# Patient Record
Sex: Female | Born: 1970 | Race: White | Hispanic: No | Marital: Married | Smoking: Current every day smoker
Health system: Southern US, Community
[De-identification: ages and names within clinical notes are randomized; demographics above are authoritative.]

## PROBLEM LIST (undated history)

## (undated) DIAGNOSIS — M797 Fibromyalgia: Secondary | ICD-10-CM

## (undated) DIAGNOSIS — R001 Bradycardia, unspecified: Secondary | ICD-10-CM

## (undated) DIAGNOSIS — D649 Anemia, unspecified: Secondary | ICD-10-CM

## (undated) DIAGNOSIS — I10 Essential (primary) hypertension: Secondary | ICD-10-CM

## (undated) DIAGNOSIS — Z87442 Personal history of urinary calculi: Secondary | ICD-10-CM

## (undated) DIAGNOSIS — M1990 Unspecified osteoarthritis, unspecified site: Secondary | ICD-10-CM

## (undated) DIAGNOSIS — Z8719 Personal history of other diseases of the digestive system: Secondary | ICD-10-CM

## (undated) DIAGNOSIS — C801 Malignant (primary) neoplasm, unspecified: Secondary | ICD-10-CM

## (undated) DIAGNOSIS — G629 Polyneuropathy, unspecified: Secondary | ICD-10-CM

## (undated) DIAGNOSIS — F419 Anxiety disorder, unspecified: Secondary | ICD-10-CM

## (undated) HISTORY — PX: OTHER SURGICAL HISTORY: SHX169

## (undated) HISTORY — PX: ABDOMINAL HYSTERECTOMY: SHX81

## (undated) HISTORY — PX: APPENDECTOMY: SHX54

## (undated) HISTORY — PX: CHOLECYSTECTOMY: SHX55

## (undated) HISTORY — PX: BREAST CYST ASPIRATION: SHX578

## (undated) HISTORY — PX: JOINT REPLACEMENT: SHX530

## (undated) HISTORY — PX: REPLACEMENT TOTAL KNEE: SUR1224

---
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
## 2004-02-13 ENCOUNTER — Other Ambulatory Visit: Payer: Self-pay

## 2004-12-15 ENCOUNTER — Inpatient Hospital Stay: Payer: Self-pay | Admitting: Unknown Physician Specialty

## 2005-04-19 ENCOUNTER — Emergency Department: Payer: Self-pay | Admitting: Unknown Physician Specialty

## 2007-06-18 ENCOUNTER — Other Ambulatory Visit: Payer: Self-pay

## 2007-06-18 ENCOUNTER — Emergency Department: Payer: Self-pay | Admitting: Emergency Medicine

## 2009-03-29 ENCOUNTER — Emergency Department: Payer: Self-pay | Admitting: Emergency Medicine

## 2009-11-05 ENCOUNTER — Emergency Department: Payer: Self-pay | Admitting: Emergency Medicine

## 2009-12-05 ENCOUNTER — Emergency Department: Payer: Self-pay | Admitting: Emergency Medicine

## 2011-05-04 ENCOUNTER — Emergency Department: Payer: Self-pay | Admitting: Emergency Medicine

## 2011-10-19 ENCOUNTER — Emergency Department: Payer: Self-pay | Admitting: *Deleted

## 2012-05-04 ENCOUNTER — Emergency Department: Payer: Self-pay | Admitting: Emergency Medicine

## 2013-03-05 ENCOUNTER — Observation Stay: Payer: Self-pay | Admitting: Internal Medicine

## 2013-03-05 LAB — CBC
HCT: 42.5 % (ref 35.0–47.0)
HGB: 14.4 g/dL (ref 12.0–16.0)
MCHC: 33.8 g/dL (ref 32.0–36.0)
Platelet: 239 10*3/uL (ref 150–440)
RBC: 4.16 10*6/uL (ref 3.80–5.20)
RDW: 13.9 % (ref 11.5–14.5)
WBC: 15.5 10*3/uL — ABNORMAL HIGH (ref 3.6–11.0)

## 2013-03-05 LAB — URINALYSIS, COMPLETE
Bilirubin,UR: NEGATIVE
Glucose,UR: NEGATIVE mg/dL (ref 0–75)
Hyaline Cast: 3
Ketone: NEGATIVE
Ph: 6 (ref 4.5–8.0)
Specific Gravity: 1.01 (ref 1.003–1.030)
Squamous Epithelial: <1
WBC UR: 12 /HPF (ref 0–5)

## 2013-03-05 LAB — BASIC METABOLIC PANEL
BUN: 9 mg/dL (ref 7–18)
Calcium, Total: 8.8 mg/dL (ref 8.5–10.1)
Chloride: 102 mmol/L (ref 98–107)
Co2: 28 mmol/L (ref 21–32)
EGFR (African American): >60
EGFR (Non-African Amer.): >60
Glucose: 87 mg/dL (ref 65–99)
Potassium: 3.5 mmol/L (ref 3.5–5.1)
Sodium: 136 mmol/L (ref 136–145)

## 2013-03-05 LAB — TSH: Thyroid Stimulating Horm: 0.626 u[IU]/mL

## 2013-03-05 LAB — TROPONIN I: Troponin-I: <0.02 ng/mL

## 2013-03-06 DIAGNOSIS — R55 Syncope and collapse: Secondary | ICD-10-CM

## 2013-03-06 DIAGNOSIS — R079 Chest pain, unspecified: Secondary | ICD-10-CM

## 2013-03-06 LAB — CBC WITH DIFFERENTIAL/PLATELET
Basophil %: 0.5 %
Eosinophil %: 2.6 %
HCT: 38.8 % (ref 35.0–47.0)
HGB: 13.5 g/dL (ref 12.0–16.0)
Lymphocyte %: 26.3 %
MCH: 35.5 pg — ABNORMAL HIGH (ref 26.0–34.0)
MCHC: 34.7 g/dL (ref 32.0–36.0)
MCV: 102 fL — ABNORMAL HIGH (ref 80–100)
Monocyte #: 1 x10 3/mm — ABNORMAL HIGH (ref 0.2–0.9)
Neutrophil #: 6.8 10*3/uL — ABNORMAL HIGH (ref 1.4–6.5)
Neutrophil %: 61.9 %
Platelet: 223 10*3/uL (ref 150–440)
RDW: 13.6 % (ref 11.5–14.5)
WBC: 10.9 10*3/uL (ref 3.6–11.0)

## 2013-03-06 LAB — BASIC METABOLIC PANEL
Anion Gap: 5 — ABNORMAL LOW (ref 7–16)
BUN: 14 mg/dL (ref 7–18)
Calcium, Total: 8.6 mg/dL (ref 8.5–10.1)
Chloride: 105 mmol/L (ref 98–107)
Creatinine: 0.77 mg/dL (ref 0.60–1.30)
EGFR (African American): >60
EGFR (Non-African Amer.): >60
Glucose: 85 mg/dL (ref 65–99)
Osmolality: 274 (ref 275–301)
Potassium: 3.4 mmol/L — ABNORMAL LOW (ref 3.5–5.1)

## 2013-03-06 LAB — TROPONIN I: Troponin-I: <0.02 ng/mL

## 2014-06-18 ENCOUNTER — Emergency Department: Payer: Self-pay | Admitting: Emergency Medicine

## 2014-06-18 LAB — URINALYSIS, COMPLETE
Bilirubin,UR: NEGATIVE
Glucose,UR: NEGATIVE mg/dL (ref 0–75)
Ketone: NEGATIVE
LEUKOCYTE ESTERASE: NEGATIVE
NITRITE: NEGATIVE
Ph: 7 (ref 4.5–8.0)
Protein: NEGATIVE
RBC,UR: 3 /HPF (ref 0–5)
SPECIFIC GRAVITY: 1.008 (ref 1.003–1.030)
Squamous Epithelial: NONE SEEN

## 2014-06-18 LAB — COMPREHENSIVE METABOLIC PANEL
ALK PHOS: 53 U/L
Albumin: 4 g/dL (ref 3.4–5.0)
Anion Gap: 6 — ABNORMAL LOW (ref 7–16)
BUN: 6 mg/dL — ABNORMAL LOW (ref 7–18)
Bilirubin,Total: 0.6 mg/dL (ref 0.2–1.0)
CALCIUM: 8.8 mg/dL (ref 8.5–10.1)
CHLORIDE: 105 mmol/L (ref 98–107)
Co2: 26 mmol/L (ref 21–32)
Creatinine: 0.84 mg/dL (ref 0.60–1.30)
GLUCOSE: 82 mg/dL (ref 65–99)
Osmolality: 271 (ref 275–301)
POTASSIUM: 3.9 mmol/L (ref 3.5–5.1)
SGOT(AST): 16 U/L (ref 15–37)
SGPT (ALT): 13 U/L — ABNORMAL LOW
SODIUM: 137 mmol/L (ref 136–145)
Total Protein: 7.9 g/dL (ref 6.4–8.2)

## 2014-06-18 LAB — CBC
HCT: 38.9 % (ref 35.0–47.0)
HGB: 12.9 g/dL (ref 12.0–16.0)
MCH: 33.3 pg (ref 26.0–34.0)
MCHC: 33.3 g/dL (ref 32.0–36.0)
MCV: 100 fL (ref 80–100)
PLATELETS: 243 10*3/uL (ref 150–440)
RBC: 3.89 10*6/uL (ref 3.80–5.20)
RDW: 13.7 % (ref 11.5–14.5)
WBC: 11.8 10*3/uL — ABNORMAL HIGH (ref 3.6–11.0)

## 2014-06-18 LAB — LIPASE, BLOOD: LIPASE: 118 U/L (ref 73–393)

## 2015-03-12 NOTE — Discharge Summary (Signed)
PATIENT NAME:  Amy Logan, Amy Logan MR#:  [id number] DATE OF BIRTH:  1971-05-08  DATE OF ADMISSION:  03/05/2013 DATE OF DISCHARGE:  03/06/2013  ADMITTING DIAGNOSIS: Chest pain and syncope.   DISCHARGE DIAGNOSES: 1.  Chest pain felt to be noncardiac, status post stress test.  2.  Syncope, felt to be vasovagal in nature. Echocardiogram showed no significant abnormality.  3.  Hypertension.  4.  Chronic neuropathy.  5.  History of myocardial infarction in 2008.  6.  Anxiety disorder.   CONSULTANTS: None.   PERTINENT LABS AND EVALUATIONS: Admitting BMP: Glucose was 87, BUN 9, creatinine 0.77, sodium 135, potassium 3.5, chloride 102, CO2 28 and calcium 8.8. Troponin was less than 0.02 times 3. TSH 0.626. WBC on admission was 15.5, hemoglobin 14.4 and platelet count was 203. UA was nitrite negative, leukocytes negative.   EKG on admission showed normal sinus rhythm without any ST-T wave changes.   Echocardiogram of the heart showed LVF of 60% to 65%, normal left global ventricular systolic function, borderline concentric LVH.   Myocardial perfusion scan was negative for ischemia or infarct.   HOSPITAL COURSE: Please refer to H and P done by the admitting physician. The patient is a 44 year old Caucasian female with history of hypertension, neuropathy, and history of MI in 2008.  She presented with complaint of having chest pain and syncope after earlier in the day she had an argument with her boyfriend and started having severe chest pain. She came to the ED.  Her cardiac enzymes and EKG were nonrevealing. The patient also had an episode of syncope. Due to these symptoms, the patient was placed on telemetry. Serial cardiac enzymes were done, which were negative. The patient underwent a cardiac stress test, which was negative. She also had an echocardiogram which did not show any significant abnormality. The patient was asymptomatic and her symptoms were felt to be atypical. She was deemed stable for  discharge at this time.   DISCHARGE MEDICATIONS:  1.  Hydrochlorothiazide/lisinopril 25/20 mg daily.  2.  Cymbalta 60 mg daily. 3.  Tylenol 650 mg q. 4 hours p.r.n.  4.  Xanax 1 mg t.i.d. p.r.n. anxiety.  DIET: Low sodium.   ACTIVITY: As tolerated.   TIMEFRAME FOR FOLLOW-UP:  In 1 to 2 weeks with primary MD.  TIME SPENT: 35 minutes. ____________________________ Lafonda Mosses Posey Pronto, MD shp:sb D: 03/07/2013 08:06:06 ET T: 03/07/2013 08:56:02 ET JOB#: [id number]  cc: Cleon Thoma H. Posey Pronto, MD, <Dictator> Alric Seton MD ELECTRONICALLY SIGNED 03/16/2013 13:03

## 2015-03-12 NOTE — H&P (Signed)
PATIENT NAME:  Amy Logan, Amy Logan MR#:  [id number] DATE OF BIRTH:  [birth year]  DATE OF ADMISSION:  03/05/2013   PRIMARE CARE PHYSICIAN:  Dr. Bernie Covey  REQUESTING PHYSICIAN:  Dr. Royal Hawthorn  CHIEF COMPLAINT:  Chest pain.   HISTORY OF PRESENT ILLNESS:  The patient is a 44 year old female with a known history of hypertension, neuropathy and MI in 2008 is being admitted for chest pain and syncope.  The patient was having some argument with her boyfriend in the afternoon, around 12:30 to 1:00 p.m., when she started having severe chest pain.  She was bent over.  She has never had this bad chest pain and her boyfriend ignored thinking that she was just faking, but her pain continued.  She sat down on her knee and was able to stand up and walk to her kitchen where she fell.  Her boyfriend called out and she would not respond.  He tried 1 or 2 times shouting loudly, but she would not respond so he went to the kitchen to see what happened and she was found completely unconscious with eye twitching really fast.  She was out for about 15 to 20 minutes without any response.  EMS was called who also found her to be unconscious.  They tried ammonia, also tried deep pain stimulation, along with ammonia and oxygen, but she would not respond.  She did have some dry heaving afterwards and woke up after 20 minutes.  She was still having some chest tightness and is being admitted for further evaluation and management.   PAST MEDICAL HISTORY:   1.  Hypertension.  2.  Neuropathy.  3.  History of myocardial infarction in 2008 and was following with cardiology at [place] where she was living.   ALLERGIES:  DEMEROL, IMITREX, MORPHINE, PHENERGAN, COCONUT AND ONION.  MEDICATIONS AT HOME:   1.  Lisinopril/hydrochlorothiazide 20/25 mg 1 tablet by mouth daily. 2.  Cymbalta 60 mg by mouth at bedtime.  3.  Xanax 0.5 mg by mouth at bedtime as needed.    SOCIAL HISTORY:  Smokes 1 pack of cigarettes daily for the last  25 years.  Occasional alcohol.  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   FAMILY HISTORY:  Father with colon and liver cancer, died of heart disease.  Mother is alive with diabetes, heart failure, coronary disease and COPD.   REVIEW OF SYSTEMS: CONSTITUTIONAL:  No fever, fatigue, weakness.  EYES:  No blurred or double vision.  EARS, NOSE, THROAT:  No tinnitus or ear pain.  RESPIRATORY:  No cough, wheezing, hemoptysis.  CARDIOVASCULAR:  Positive for chest pain.  No orthopnea, edema.  GASTROINTESTINAL:  No nausea, vomiting, diarrhea.  GENITOURINARY:  No dysuria, hematuria.  ENDOCRINE:  No polyuria, nocturia.  HEMATOLOGY:  No anemia or easy bruising.  SKIN:  No rash or lesion.  MUSCULOSKELETAL:  No arthritis or muscle cramps.  NEUROLOGIC:  Unconsciousness and neuropathy.  PSYCHIATRIC:  Possible anxiety and depression.   PHYSICAL EXAMINATION: VITAL SIGNS:  Temperature 97.8, heart rate 70 per minute, respirations 20 per minute, blood pressure 132/84 mmhg.  She is saturating 99% on room air. GENERAL:  The patient is a 44 year old female lying in the bed comfortably without any acute distress.  EYES:  Pupils equal, round, reactive to light and accommodation.  No scleral icterus.  Extraocular muscles intact.  HEENT:  Head atraumatic, normocephalic.  Oropharynx and nasopharynx clear. NECK:  Supple, No JVD. No thyroid enlargement or tenderness.  LUNGS:  Clear to auscultation  bilaterally.  No wheezing, rales, rhonchi, crepitation.   CARDIOVASCULAR:  S1, S2 normal.  No murmurs, rubs or gallops.  ABDOMEN:  Soft, nontender, nondistended.  Bowel sounds present.  No organomegaly or mass.  EXTREMITIES:  No pedal edema, cyanosis, clubbing. NEUROLOGIC:  Cranial nerves II through XII intact.  Muscle strength 5 out of 5 in all extremities.  Sensation intact.    PSYCHIATRIC:  The patient seems anxious.  She does seem quite upset also.  She was just on the phone with her boyfriend.  They have been  having a lot of arguments back and forth.  SKIN:  No obvious rash, lesion or ulcer.   LABORATORY DATA:  Normal BMP.  Normal first set of cardiac enzymes. Normal CBC except white count of 15.5.   Chest x-ray in the ED showed no acute cardiopulmonary disease.   EKG showed no acute ST-T changes.  Normal sinus rhythm.   IMPRESSION AND PLAN: 1.  Chest pain, very atypical, likely due to recent argument with her boyfriend, probably psychogenic, although considering her history of myocardial infarction, we will admit her on observation.  Get serial troponins.  Myoview in the morning.  She will be placed on off unit telemetry considering her unconscious spell to make sure she does not have any major arrhythmias including ventricular tachycardia or ventricular fibrillation.  Again, this is likely due to psychological issues.  2.  Syncope/collapse, slightly vasovagal in nature.  We will check orthostatic vitals and TSH.  Start her on Xanax as needed and monitor her status.  We will hold off any further neurological workup as this does sound more vasovagal in nature.  She is nonfocal at this time.  3.  Neuropathy.  We will continue Cymbalta.  4.  Hypertension.  We will continue home medication.  Her blood pressure is stable.  5.  CODE STATUS:  FULL CODE.  6.  Tobacco abuse.  She was counseled for about three minutes.  She does not have any intention to quit at this time.  She feels very stressed due to her relation with her boyfriend and we will provide smoking cessation material at this time.  She denied any nicotine patch in the hospital.  Total time taking care of this patient is 55 minutes.     ____________________________ Lucina Mellow. Manuella Ghazi, MD vss:ea D: 03/05/2013 17:50:17 ET T: 03/05/2013 19:05:30 ET JOB#: [id number]  cc: Sharran Caratachea S. Manuella Ghazi, MD, <Dictator> Lucina Mellow [facility] MD ELECTRONICALLY SIGNED 03/07/2013 23:25

## 2015-08-15 ENCOUNTER — Encounter: Payer: Self-pay | Admitting: *Deleted

## 2015-08-15 ENCOUNTER — Ambulatory Visit
Admission: EM | Admit: 2015-08-15 | Discharge: 2015-08-15 | Disposition: A | Discharge status: Home / Self Care | Payer: Medicaid Other | Attending: Internal Medicine | Admitting: Internal Medicine

## 2015-08-15 DIAGNOSIS — J0100 Acute maxillary sinusitis, unspecified: Secondary | ICD-10-CM | POA: Diagnosis not present

## 2015-08-15 DIAGNOSIS — J0110 Acute frontal sinusitis, unspecified: Secondary | ICD-10-CM | POA: Diagnosis not present

## 2015-08-15 HISTORY — DX: Anxiety disorder, unspecified: F41.9

## 2015-08-15 HISTORY — DX: Bradycardia, unspecified: R00.1

## 2015-08-15 HISTORY — DX: Polyneuropathy, unspecified: G62.9

## 2015-08-15 HISTORY — DX: Essential (primary) hypertension: I10

## 2015-08-15 MED ORDER — BENZONATATE 100 MG PO CAPS: 100.0000 mg | ORAL_CAPSULE | Freq: Three times a day (TID) | ORAL | Status: DC | PRN

## 2015-08-15 MED ORDER — AZITHROMYCIN 250 MG PO TABS: ORAL_TABLET | ORAL | Status: DC

## 2015-08-15 MED ORDER — FLUCONAZOLE 150 MG PO TABS: 150.0000 mg | ORAL_TABLET | Freq: Once | ORAL | Status: DC

## 2015-08-15 NOTE — ED Provider Notes (Signed)
[facility] Emergency Department Provider Note  ____________________________________________  Time seen: Approximately 12:26 PM  I have reviewed the triage vital signs and the nursing notes.   HISTORY  Chief Complaint Facial Pain; Nasal Congestion; and Cough   HPI Amy Logan is a 44 y.o. female presents for the complaints of runny nose, congestion and sinus pressure since last Friday. Reports started out as a runny nose and then has continued to progress into sinus pressure. Patient reports that she has a chronic history of sinus infections and sinus issues in this feels similar. Reports cough is primarily with nasal drainage and more at night. Denies chest pain, short is a breath, fever, abdominal pain, neck pain or other complaints. Reports continues to eat and drink well. Denies recent sick contacts. Reports history of seasonal allergies and feels that this may have started as a trigger for this.    Past Medical History  Diagnosis Date  . Hypertension   . Neuropathy   . Anxiety   . Bradycardia     There are no active problems to display for this patient.   Past Surgical History  Procedure Laterality Date  . Cholecystectomy    . Appendectomy    . Replacement total knee      Right knee  . Abdominal hysterectomy      Current Outpatient Rx  Name  Route  Sig  Dispense  Refill  . ALPRAZolam (XANAX) 1 MG tablet   Oral   Take 1 mg by mouth 3 (three) times daily as needed for anxiety.         . DULoxetine HCl (CYMBALTA PO)   Oral   Take 90 mg by mouth.         Marland Kitchen lisinopril-hydrochlorothiazide (PRINZIDE,ZESTORETIC) 20-25 MG per tablet   Oral   Take 1 tablet by mouth daily.           Allergies Demerol; Morphine and related; and Phenergan  No family history on file.  Social History Social History  Substance Use Topics  . Smoking status: Current Every Day Smoker  . Smokeless tobacco: None  . Alcohol Use: No    Review of  Systems Constitutional: No fever/chills Eyes: No visual changes. QVZ:DGLOVFIE for runny nose, congestion and sinus pressure.  Cardiovascular: Denies chest pain. Respiratory: Denies shortness of breath. Gastrointestinal: No abdominal pain.  No nausea, no vomiting.  No diarrhea.  No constipation. Genitourinary: Negative for dysuria. Musculoskeletal: Negative for back pain. Skin: Negative for rash. Neurological: Negative for headaches, focal weakness or numbness.  10-point ROS otherwise negative.  ____________________________________________   PHYSICAL EXAM:  VITAL SIGNS: ED Triage Vitals  Enc Vitals Group     BP 08/15/15 1143 122/74 mmHg     Pulse Rate 08/15/15 1143 73     Resp -- 18     Temp 08/15/15 1143 98.1 F (36.7 C)     Temp Source 08/15/15 1143 Oral     SpO2 08/15/15 1143 100 %     Weight 08/15/15 1143 178 lb (80.74 kg)     Height 08/15/15 1143 5' 6.5" (1.689 m)     Head Cir --      Peak Flow --      Pain Score 08/15/15 1147 2     Pain Loc --      Pain Edu? --      Excl. in [place]? --     Constitutional: Alert and oriented. Well appearing and in no acute distress. Eyes: Conjunctivae are normal.  PERRL. EOMI. Head: Atraumatic.mod TTP maxillary sinus, and mod TTP frontal sinus. No erythema or swelling.   Ears: no erythema, bilateral TM dullness, no erythema.   Nose: clear rhinorrhea with bilateral nasal turbinate edema and bogginess, nares patent.   Mouth/Throat: Mucous membranes are moist.  Oropharynx non-erythematous. Neck: No stridor.  No cervical spine tenderness to palpation. Hematological/Lymphatic/Immunilogical: No cervical lymphadenopathy. Cardiovascular: Normal rate, regular rhythm. Grossly normal heart sounds.  Good peripheral circulation. Respiratory: Normal respiratory effort.  No retractions. Lungs CTAB. Gastrointestinal: Soft and nontender. No distention. Normal Bowel sounds.   Musculoskeletal: No lower or upper extremity tenderness nor edema.   Neurologic:  Normal speech and language. No gross focal neurologic deficits are appreciated. No gait instability. Skin:  Skin is warm, dry and intact. No rash noted. Psychiatric: Mood and affect are normal. Speech and behavior are normal.  ____________________________________________   LABS (all labs ordered are listed, but only abnormal results are displayed)  Labs Reviewed - No data to display   INITIAL IMPRESSION / ASSESSMENT AND PLAN / ED COURSE  Pertinent labs & imaging results that were available during my care of the patient were reviewed by me and considered in my medical decision making (see chart for details).  Well-appearing patient. No acute distress. Presents for runny nose, nasal congestion and sinus pressure since last Friday. Patient reports chronic history of sinusitis as well as sinus surgery. Patient reports intermittent cough primarily with postnasal drainage. Will treat sinusitis with oral azithromycin, prn tessalon perles, patient also requests rx for x one diflucan as she often experiences vaginal yeast infections with antibiotic use. Discussed follow up with Primary care physician this week, Dr bliss. . Discussed follow up and return parameters including no resolution or any worsening concerns. Patient verbalized understanding and agreed to plan.   ____________________________________________   FINAL CLINICAL IMPRESSION(S) / ED DIAGNOSES  Final diagnoses:  Acute maxillary sinusitis, recurrence not specified  Acute frontal sinusitis, recurrence not specified       Marylene Land, NP 08/15/15 1235

## 2015-08-15 NOTE — Discharge Instructions (Signed)
Take medication as prescribed. Rest. Drink plenty of fluids.   Follow up with your primary care physician this week. Return to urgent care for new or worsening concerns.   Sinusitis Sinusitis is redness, soreness, and inflammation of the paranasal sinuses. Paranasal sinuses are air pockets within the bones of your face (beneath the eyes, the middle of the forehead, or above the eyes). In healthy paranasal sinuses, mucus is able to drain out, and air is able to circulate through them by way of your nose. However, when your paranasal sinuses are inflamed, mucus and air can become trapped. This can allow bacteria and other germs to grow and cause infection. Sinusitis can develop quickly and last only a short time (acute) or continue over a long period (chronic). Sinusitis that lasts for more than 12 weeks is considered chronic.  CAUSES  Causes of sinusitis include:  Allergies.  Structural abnormalities, such as displacement of the cartilage that separates your nostrils (deviated septum), which can decrease the air flow through your nose and sinuses and affect sinus drainage.  Functional abnormalities, such as when the small hairs (cilia) that line your sinuses and help remove mucus do not work properly or are not present. SIGNS AND SYMPTOMS  Symptoms of acute and chronic sinusitis are the same. The primary symptoms are pain and pressure around the affected sinuses. Other symptoms include:  Upper toothache.  Earache.  Headache.  Bad breath.  Decreased sense of smell and taste.  A cough, which worsens when you are lying flat.  Fatigue.  Fever.  Thick drainage from your nose, which often is green and may contain pus (purulent).  Swelling and warmth over the affected sinuses. DIAGNOSIS  Your health care provider will perform a physical exam. During the exam, your health care provider may:  Look in your nose for signs of abnormal growths in your nostrils (nasal polyps).  Tap over  the affected sinus to check for signs of infection.  View the inside of your sinuses (endoscopy) using an imaging device that has a light attached (endoscope). If your health care provider suspects that you have chronic sinusitis, one or more of the following tests may be recommended:  Allergy tests.  Nasal culture. A sample of mucus is taken from your nose, sent to a lab, and screened for bacteria.  Nasal cytology. A sample of mucus is taken from your nose and examined by your health care provider to determine if your sinusitis is related to an allergy. TREATMENT  Most cases of acute sinusitis are related to a viral infection and will resolve on their own within 10 days. Sometimes medicines are prescribed to help relieve symptoms (pain medicine, decongestants, nasal steroid sprays, or saline sprays).  However, for sinusitis related to a bacterial infection, your health care provider will prescribe antibiotic medicines. These are medicines that will help kill the bacteria causing the infection.  Rarely, sinusitis is caused by a fungal infection. In theses cases, your health care provider will prescribe antifungal medicine. For some cases of chronic sinusitis, surgery is needed. Generally, these are cases in which sinusitis recurs more than 3 times per year, despite other treatments. HOME CARE INSTRUCTIONS   Drink plenty of water. Water helps thin the mucus so your sinuses can drain more easily.  Use a humidifier.  Inhale steam 3 to 4 times a day (for example, sit in the bathroom with the shower running).  Apply a warm, moist washcloth to your face 3 to 4 times a day, or  as directed by your health care provider.  Use saline nasal sprays to help moisten and clean your sinuses.  Take medicines only as directed by your health care provider.  If you were prescribed either an antibiotic or antifungal medicine, finish it all even if you start to feel better. SEEK IMMEDIATE MEDICAL CARE  IF:  You have increasing pain or severe headaches.  You have nausea, vomiting, or drowsiness.  You have swelling around your face.  You have vision problems.  You have a stiff neck.  You have difficulty breathing. MAKE SURE YOU:   Understand these instructions.  Will watch your condition.  Will get help right away if you are not doing well or get worse. Document Released: 11/06/2005 Document Revised: 03/23/2014 Document Reviewed: 11/21/2011 [facility] Patient Information 2015 [place], [place]. This information is not intended to replace advice given to you by your health care provider. Make sure you discuss any questions you have with your health care provider.

## 2015-08-15 NOTE — ED Notes (Signed)
Pt states that she has sinus pressure and cough around Friday.

## 2015-11-04 ENCOUNTER — Ambulatory Visit
Admission: EM | Admit: 2015-11-04 | Discharge: 2015-11-04 | Disposition: A | Discharge status: Home / Self Care | Payer: Medicaid Other | Attending: Family Medicine | Admitting: Family Medicine

## 2015-11-04 DIAGNOSIS — J321 Chronic frontal sinusitis: Secondary | ICD-10-CM | POA: Diagnosis not present

## 2015-11-04 MED ORDER — SULFAMETHOXAZOLE-TRIMETHOPRIM 800-160 MG PO TABS: 1.0000 | ORAL_TABLET | Freq: Two times a day (BID) | ORAL | Status: DC

## 2015-11-04 MED ORDER — FLUTICASONE PROPIONATE 50 MCG/ACT NA SUSP: 2.0000 | Freq: Every day | NASAL | Status: DC

## 2015-11-04 MED ORDER — FLUCONAZOLE 150 MG PO TABS: 150.0000 mg | ORAL_TABLET | Freq: Every day | ORAL | Status: DC

## 2015-11-04 NOTE — ED Notes (Signed)
Started 3 days ago with sinus congestion and pressure behind eyes. Green/yellow nasal drainage

## 2015-11-04 NOTE — Discharge Instructions (Signed)

## 2015-11-04 NOTE — ED Provider Notes (Signed)
CSN: [id number]     Arrival date & time 11/04/15  0903 History   First MD Initiated Contact with Patient 11/04/15 1028     Chief Complaint  Patient presents with  . URI   (Consider location/radiation/quality/duration/timing/severity/associated sxs/prior Treatment) HPI   44 year old female who returns today sinus congestion or behind her eyes and mucous of a green-yellow colored. States that she was treated months ago for her chronic sinusitis but was given Zithromax which does not seem to work as well as the [employer]. 4 she's had her symptoms but these have exacerbated over the last 3 days. 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 Is that she took her temperature today which was 102 although she is afebrile today. O2 sats on room air are 100%.   Past Medical History  Diagnosis Date  . Hypertension   . Neuropathy ([place])   . Anxiety   . Bradycardia    Past Surgical History  Procedure Laterality Date  . Cholecystectomy    . Appendectomy    . Replacement total knee      Right knee  . Abdominal hysterectomy    . Joint replacement     Family History  Problem Relation Age of Onset  . Diabetes Mother   . Heart failure Mother   . Asthma Mother   . Cancer Father    Social History  Substance Use Topics  . Smoking status: Current Every Day Smoker -- 0.50 packs/day    Types: Cigarettes  . Smokeless tobacco: None  . Alcohol Use: No   OB History    No data available     Review of Systems  Constitutional: Positive for fever. Negative for chills, diaphoresis, activity change and fatigue.  HENT: Positive for congestion, ear pain, postnasal drip, rhinorrhea, sinus pressure, sneezing and sore throat.   All other systems reviewed and are negative.   Allergies  Demerol; Morphine and related; and Phenergan  Home Medications   Prior to Admission medications   Medication Sig Start Date End Date Taking? Authorizing  Provider  ALPRAZolam Duanne Moron) 1 MG tablet Take 1 mg by mouth 3 (three) times daily as needed for anxiety.   Yes Historical Provider, MD  DULoxetine HCl (CYMBALTA PO) Take 90 mg by mouth.   Yes Historical Provider, MD  lisinopril-hydrochlorothiazide (PRINZIDE,ZESTORETIC) 20-25 MG per tablet Take 1 tablet by mouth daily.   Yes Historical Provider, MD  azithromycin (ZITHROMAX Z-PAK) 250 MG tablet Take 2 tablets (500 mg) on  Day 1,  followed by 1 tablet (250 mg) once daily on Days 2 through 5. 08/15/15   Marylene Land, NP  benzonatate (TESSALON PERLES) 100 MG capsule Take 1 capsule (100 mg total) by mouth 3 (three) times daily as needed for cough. 08/15/15   Marylene Land, NP  fluconazole (DIFLUCAN) 150 MG tablet Take 1 tablet (150 mg total) by mouth daily. 11/04/15   Lorin Picket, PA-C  fluticasone (FLONASE) 50 MCG/ACT nasal spray Place 2 sprays into both nostrils daily. 11/04/15   Lorin Picket, PA-C  sulfamethoxazole-trimethoprim (BACTRIM DS,SEPTRA DS) 800-160 MG tablet Take 1 tablet by mouth 2 (two) times daily. 11/04/15   Lorin Picket, PA-C   Meds Ordered and Administered this Visit  Medications - No data to display  BP 145/88 mmHg  Pulse 78  Temp(Src) 96.7 F (35.9 C) (Tympanic)  Resp 18  Ht 5\' 6"  (1.676 m)  Wt 176 lb (79.833 kg)  BMI 28.42 kg/m2  SpO2 100% No data found.   Physical Exam  Constitutional: She is oriented to person, place, and time. She appears well-developed and well-nourished. No distress.  HENT:  Head: Normocephalic and atraumatic.  Right Ear: External ear normal.  Left Ear: External ear normal.  Nose: Nose normal.  Mouth/Throat: Oropharynx is clear and moist. No oropharyngeal exudate.  Patient has tenderness to percussion over the frontal and especially the maxillary sinuses bilaterally  Eyes: Conjunctivae are normal. Pupils are equal, round, and reactive to light.  Neck: Normal range of motion. Neck supple.  Pulmonary/Chest: Breath sounds normal. No  respiratory distress. She has no wheezes. She has no rales.  Musculoskeletal: Normal range of motion. She exhibits no edema or tenderness.  Lymphadenopathy:    She has no cervical adenopathy.  Neurological: She is alert and oriented to person, place, and time.  Skin: Skin is warm and dry. She is not diaphoretic.  Psychiatric: She has a normal mood and affect. Her behavior is normal. Judgment and thought content normal.  Nursing note and vitals reviewed.   ED Course  Procedures (including critical care time)  Labs Review Labs Reviewed - No data to display  Imaging Review No results found.   Visual Acuity Review  Right Eye Distance:   Left Eye Distance:   Bilateral Distance:    Right Eye Near:   Left Eye Near:    Bilateral Near:         MDM   1. Sinusitis chronic, frontal    New Prescriptions   FLUCONAZOLE (DIFLUCAN) 150 MG TABLET    Take 1 tablet (150 mg total) by mouth daily.   FLUTICASONE (FLONASE) 50 MCG/ACT NASAL SPRAY    Place 2 sprays into both nostrils daily.   SULFAMETHOXAZOLE-TRIMETHOPRIM (BACTRIM DS,SEPTRA DS) 800-160 MG TABLET    Take 1 tablet by mouth 2 (two) times daily.  Plan: 1. Diagnosis reviewed with patient 2. rx as per orders; risks, benefits, potential side effects reviewed with patient 3. Recommend supportive treatment use of Neti  pot followed by Flonase. this is a chronic condition and she's been having this for months now which seemed to be on treated with the Zithromax we will switch her to the Septra DS for 10 day course. Pressure to follow-up with her primary care as she continues to have problems and may need ENT specialist referral  4. F/u prn if symptoms worsen or don't improve      Lorin Picket, PA-C 11/04/15 1057

## 2015-11-21 DIAGNOSIS — M75111 Incomplete rotator cuff tear or rupture of right shoulder, not specified as traumatic: Secondary | ICD-10-CM | POA: Insufficient documentation

## 2015-12-20 DIAGNOSIS — Z862 Personal history of diseases of the blood and blood-forming organs and certain disorders involving the immune mechanism: Secondary | ICD-10-CM | POA: Insufficient documentation

## 2016-05-01 ENCOUNTER — Ambulatory Visit
Admission: EM | Admit: 2016-05-01 | Discharge: 2016-05-01 | Disposition: A | Discharge status: Home / Self Care | Payer: Medicaid Other | Attending: Family Medicine | Admitting: Family Medicine

## 2016-05-01 DIAGNOSIS — G43009 Migraine without aura, not intractable, without status migrainosus: Secondary | ICD-10-CM | POA: Diagnosis not present

## 2016-05-01 HISTORY — DX: Fibromyalgia: M79.7

## 2016-05-01 MED ORDER — ONDANSETRON 8 MG PO TBDP
8.0000 mg | ORAL_TABLET | Freq: Once | ORAL | Status: AC
  Administered 2016-05-01: 8 mg via ORAL

## 2016-05-01 MED ORDER — DIPHENHYDRAMINE HCL 50 MG PO CAPS
50.0000 mg | ORAL_CAPSULE | Freq: Once | ORAL | Status: AC
  Administered 2016-05-01: 50 mg via ORAL

## 2016-05-01 MED ORDER — KETOROLAC TROMETHAMINE 60 MG/2ML IM SOLN
60.0000 mg | Freq: Once | INTRAMUSCULAR | Status: AC
  Administered 2016-05-01: 60 mg via INTRAMUSCULAR

## 2016-05-01 NOTE — Discharge Instructions (Signed)
Migraine Headache  A migraine headache is very bad, throbbing pain on one or both sides of your head. Talk to your doctor about what things may bring on (trigger) your migraine headaches.  HOME CARE  · Only take medicines as told by your doctor.  · Lie down in a dark, quiet room when you have a migraine.  · Keep a journal to find out if certain things bring on migraine headaches. For example, write down:    What you eat and drink.    How much sleep you get.    Any change to your diet or medicines.  · Lessen how much alcohol you drink.  · Quit smoking if you smoke.  · Get enough sleep.  · Lessen any stress in your life.  · Keep lights dim if bright lights bother you or make your migraines worse.  GET HELP RIGHT AWAY IF:   · Your migraine becomes really bad.  · You have a fever.  · You have a stiff neck.  · You have trouble seeing.  · Your muscles are weak, or you lose muscle control.  · You lose your balance or have trouble walking.  · You feel like you will pass out (faint), or you pass out.  · You have really bad symptoms that are different than your first symptoms.  MAKE SURE YOU:   · Understand these instructions.  · Will watch your condition.  · Will get help right away if you are not doing well or get worse.     This information is not intended to replace advice given to you by your health care provider. Make sure you discuss any questions you have with your health care provider.     Document Released: 08/15/2008 Document Revised: 01/29/2012 Document Reviewed: 07/14/2013  Elsevier Interactive Patient Education ©2016 [employer]

## 2016-05-01 NOTE — ED Notes (Signed)
Patient states that she has been having a migraine for 3 days. Patient states that she tried to double her Amitriptyline to see if this would help.

## 2016-05-01 NOTE — ED Provider Notes (Signed)
CSN: [id number]     Arrival date & time 05/01/16  1009 History   First MD Initiated Contact with Patient 05/01/16 1149    Nurses notes were reviewed. Chief Complaint  Patient presents with  . Migraine   Patient's here because of migraine headache. She states that she's had these migraines before this was almost a year since headaches been this bad. She reports nausea and general malaise. No actual vomiting the headache started on Friday and has been unrelenting. She's had a PET scan before the past few years ago. The headache appears to be typical migraine headache. She states that she's had the PET scan because she had an aneurysm and migraine headaches and they want to make sure that she didn't have the same problem.  She is allergic to multiple narcotics she is also allergic to Imitrex causes of tachycardia to her usual bradycardia. Past medical history is positive for hypertension neuropathy in her right knee she's had a replacement and right knee which became comp K due to the neuropathy. She has history of anxiety and fiber myalgia. Surgical history is positive for cholecystectomy appendectomy abdominal hysterectomy and joint replacement   Along with migraines in the family mother said diabetes heart failure and asthma and father had cancer. She does smoke.    (Consider location/radiation/quality/duration/timing/severity/associated sxs/prior Treatment) Patient is a 45 y.o. female presenting with migraines. The history is provided by the patient. No language interpreter was used.  Migraine This is a new problem. The current episode started more than 2 days ago. The problem occurs constantly. The problem has not changed since onset.Nothing aggravates the symptoms. She has tried nothing for the symptoms. The treatment provided no relief.    Past Medical History  Diagnosis Date  . Hypertension   . Neuropathy ([place])   . Anxiety   . Bradycardia   . Fibromyalgia    Past Surgical History   Procedure Laterality Date  . Cholecystectomy    . Appendectomy    . Replacement total knee      Right knee  . Abdominal hysterectomy    . Joint replacement     Family History  Problem Relation Age of Onset  . Diabetes Mother   . Heart failure Mother   . Asthma Mother   . Cancer Father    Social History  Substance Use Topics  . Smoking status: Current Every Day Smoker -- 0.50 packs/day    Types: Cigarettes  . Smokeless tobacco: None  . Alcohol Use: No   OB History    No data available     Review of Systems  Gastrointestinal: Positive for nausea.  All other systems reviewed and are negative.   Allergies  Demerol; Morphine and related; Phenergan; Stadol; and Imitrex  Home Medications   Prior to Admission medications   Medication Sig Start Date End Date Taking? Authorizing Provider  ALPRAZolam Duanne Moron) 1 MG tablet Take 1 mg by mouth 3 (three) times daily as needed for anxiety.   Yes Historical Provider, MD  amitriptyline (ELAVIL) 25 MG tablet Take 25 mg by mouth at bedtime.   Yes Historical Provider, MD  DULoxetine HCl (CYMBALTA PO) Take 90 mg by mouth.   Yes Historical Provider, MD  lisinopril-hydrochlorothiazide (PRINZIDE,ZESTORETIC) 20-25 MG per tablet Take 1 tablet by mouth daily.   Yes Historical Provider, MD  meloxicam (MOBIC) 15 MG tablet Take 15 mg by mouth daily.   Yes Historical Provider, MD  azithromycin (ZITHROMAX Z-PAK) 250 MG tablet Take 2 tablets (500  mg) on  Day 1,  followed by 1 tablet (250 mg) once daily on Days 2 through 5. 08/15/15   Marylene Land, NP  benzonatate (TESSALON PERLES) 100 MG capsule Take 1 capsule (100 mg total) by mouth 3 (three) times daily as needed for cough. 08/15/15   Marylene Land, NP  fluconazole (DIFLUCAN) 150 MG tablet Take 1 tablet (150 mg total) by mouth daily. 11/04/15   Lorin Picket, PA-C  fluticasone (FLONASE) 50 MCG/ACT nasal spray Place 2 sprays into both nostrils daily. 11/04/15   Lorin Picket, PA-C   sulfamethoxazole-trimethoprim (BACTRIM DS,SEPTRA DS) 800-160 MG tablet Take 1 tablet by mouth 2 (two) times daily. 11/04/15   Lorin Picket, PA-C   Meds Ordered and Administered this Visit   Medications  ketorolac (TORADOL) injection 60 mg (not administered)  ondansetron (ZOFRAN-ODT) disintegrating tablet 8 mg (not administered)  diphenhydrAMINE (BENADRYL) capsule 50 mg (not administered)    BP 106/65 mmHg  Pulse 61  Temp(Src) 98 F (36.7 C) (Oral)  Resp 16  Ht 5' 6.5" (1.689 m)  Wt 183 lb (83.008 kg)  BMI 29.10 kg/m2  SpO2 99% No data found.   Physical Exam  Constitutional: She is oriented to person, place, and time. She appears well-developed and well-nourished.  HENT:  Head: Normocephalic.  Right Ear: External ear normal.  Mouth/Throat: Oropharynx is clear and moist.  Eyes: Conjunctivae are normal. Pupils are equal, round, and reactive to light.  Neck: Neck supple.  Musculoskeletal: Normal range of motion.  Neurological: She is alert and oriented to person, place, and time.  Skin: Skin is warm and dry.  Psychiatric: She has a normal mood and affect.  Vitals reviewed.   ED Course  Procedures (including critical care time)  Labs Review Labs Reviewed - No data to display  Imaging Review No results found.   Visual Acuity Review  Right Eye Distance:   Left Eye Distance:   Bilateral Distance:    Right Eye Near:   Left Eye Near:    Bilateral Near:         MDM   1. Migraine without aura and without status migrainosus, not intractable      Patient with migraine exacerbation. She does not tolerate narcotics cannot take a triptan's either. Will Mr. Toradol last on Toradol IV work well but will give her Toradol IM here we do not have Compazine so going to give her Benadryl 50 mg by mouth she states that has helped before in the past will also give her Zofran 8 mg sublingual for the nausea. Follow-up with PCP as needed.  Note: This dictation was prepared  with Dragon dictation along with smaller phrase technology. Any transcriptional errors that result from this process are unintentional.     Frederich Cha, MD 05/01/16 1213

## 2016-06-02 ENCOUNTER — Other Ambulatory Visit: Payer: Self-pay | Admitting: Orthopedic Surgery

## 2016-06-02 DIAGNOSIS — M25312 Other instability, left shoulder: Secondary | ICD-10-CM

## 2016-06-02 DIAGNOSIS — M25512 Pain in left shoulder: Secondary | ICD-10-CM

## 2016-06-02 DIAGNOSIS — G8929 Other chronic pain: Secondary | ICD-10-CM

## 2016-06-19 ENCOUNTER — Ambulatory Visit
Admission: RE | Admit: 2016-06-19 | Discharge: 2016-06-19 | Disposition: A | Discharge status: Home / Self Care | Payer: Medicaid Other | Source: Ambulatory Visit | Attending: Orthopedic Surgery | Admitting: Orthopedic Surgery

## 2016-06-19 DIAGNOSIS — M25512 Pain in left shoulder: Secondary | ICD-10-CM | POA: Insufficient documentation

## 2016-06-19 DIAGNOSIS — M7580 Other shoulder lesions, unspecified shoulder: Secondary | ICD-10-CM | POA: Insufficient documentation

## 2016-06-19 DIAGNOSIS — M25312 Other instability, left shoulder: Secondary | ICD-10-CM | POA: Insufficient documentation

## 2016-06-19 DIAGNOSIS — M19012 Primary osteoarthritis, left shoulder: Secondary | ICD-10-CM | POA: Diagnosis not present

## 2016-06-19 DIAGNOSIS — M25712 Osteophyte, left shoulder: Secondary | ICD-10-CM | POA: Insufficient documentation

## 2016-06-19 DIAGNOSIS — M24812 Other specific joint derangements of left shoulder, not elsewhere classified: Secondary | ICD-10-CM | POA: Insufficient documentation

## 2016-06-19 DIAGNOSIS — G8929 Other chronic pain: Secondary | ICD-10-CM | POA: Diagnosis not present

## 2016-08-18 ENCOUNTER — Ambulatory Visit
Admission: EM | Admit: 2016-08-18 | Discharge: 2016-08-18 | Disposition: A | Discharge status: Home / Self Care | Payer: 59 | Attending: Family Medicine | Admitting: Family Medicine

## 2016-08-18 ENCOUNTER — Encounter: Payer: Self-pay | Admitting: Emergency Medicine

## 2016-08-18 DIAGNOSIS — J0100 Acute maxillary sinusitis, unspecified: Secondary | ICD-10-CM | POA: Diagnosis not present

## 2016-08-18 MED ORDER — SULFAMETHOXAZOLE-TRIMETHOPRIM 800-160 MG PO TABS: 1.0000 | ORAL_TABLET | Freq: Two times a day (BID) | ORAL | 0 refills | Status: DC

## 2016-08-18 MED ORDER — ALBUTEROL SULFATE HFA 108 (90 BASE) MCG/ACT IN AERS: 1.0000 | INHALATION_SPRAY | Freq: Four times a day (QID) | RESPIRATORY_TRACT | 0 refills | Status: DC | PRN

## 2016-08-18 MED ORDER — BENZONATATE 100 MG PO CAPS: 100.0000 mg | ORAL_CAPSULE | Freq: Three times a day (TID) | ORAL | 0 refills | Status: DC

## 2016-08-18 NOTE — ED Provider Notes (Signed)
[facility]    CSN: [id number] Arrival date & time: 08/18/16  1403     History   Chief Complaint Chief Complaint  Patient presents with  . Facial Pain  . Cough    HPI Amy Logan is a 45 y.o. female.   The history is provided by the patient.  Cough  Associated symptoms: fever   Associated symptoms: no wheezing   URI  Presenting symptoms: congestion, cough, facial pain and fever   Severity:  Moderate Onset quality:  Sudden Duration:  1 week Timing:  Constant Progression:  Worsening Chronicity:  New Relieved by:  Nothing Ineffective treatments:  OTC medications Associated symptoms: sinus pain   Associated symptoms: no wheezing   Risk factors: sick contacts   Risk factors: not elderly, no chronic cardiac disease, no chronic kidney disease, no chronic respiratory disease, no diabetes mellitus, no immunosuppression, no recent illness and no recent travel     Past Medical History:  Diagnosis Date  . Anxiety   . Bradycardia   . Fibromyalgia   . Hypertension   . Neuropathy ([place])     There are no active problems to display for this patient.   Past Surgical History:  Procedure Laterality Date  . ABDOMINAL HYSTERECTOMY    . APPENDECTOMY    . CHOLECYSTECTOMY    . JOINT REPLACEMENT    . REPLACEMENT TOTAL KNEE     Right knee    OB History    No data available       Home Medications    Prior to Admission medications   Medication Sig Start Date End Date Taking? Authorizing Provider  LORazepam (ATIVAN) 1 MG tablet Take 1 mg by mouth every 6 (six) hours as needed for anxiety.   Yes Historical Provider, MD  traMADol (ULTRAM) 50 MG tablet Take 50 mg by mouth every 6 (six) hours as needed.   Yes Historical Provider, MD  albuterol (PROVENTIL HFA;VENTOLIN HFA) 108 (90 Base) MCG/ACT inhaler Inhale 1-2 puffs into the lungs every 6 (six) hours as needed for wheezing or shortness of breath. 08/18/16   Norval Gable, MD  amitriptyline (ELAVIL) 25 MG tablet  Take 25 mg by mouth at bedtime.    Historical Provider, MD  benzonatate (TESSALON) 100 MG capsule Take 1 capsule (100 mg total) by mouth every 8 (eight) hours. 08/18/16   Norval Gable, MD  DULoxetine HCl (CYMBALTA PO) Take 90 mg by mouth.    Historical Provider, MD  fluticasone (FLONASE) 50 MCG/ACT nasal spray Place 2 sprays into both nostrils daily. 11/04/15   Lorin Picket, PA-C  lisinopril-hydrochlorothiazide (PRINZIDE,ZESTORETIC) 20-25 MG per tablet Take 1 tablet by mouth daily.    Historical Provider, MD  meloxicam (MOBIC) 15 MG tablet Take 15 mg by mouth daily.    Historical Provider, MD  sulfamethoxazole-trimethoprim (BACTRIM DS,SEPTRA DS) 800-160 MG tablet Take 1 tablet by mouth 2 (two) times daily. 08/18/16   Norval Gable, MD    Family History Family History  Problem Relation Age of Onset  . Diabetes Mother   . Heart failure Mother   . Asthma Mother   . Cancer Father     Social History Social History  Substance Use Topics  . Smoking status: Current Every Day Smoker    Packs/day: 0.50    Types: Cigarettes  . Smokeless tobacco: Never Used  . Alcohol use No     Allergies   Demerol [meperidine]; Morphine and related; Phenergan [promethazine hcl]; Stadol [butorphanol]; and Imitrex [sumatriptan]  Review of Systems Review of Systems  Constitutional: Positive for fever.  HENT: Positive for congestion.   Respiratory: Positive for cough. Negative for wheezing.      Physical Exam Triage Vital Signs ED Triage Vitals  Enc Vitals Group     BP 08/18/16 1519 (!) 155/92     Pulse Rate 08/18/16 1519 64     Resp 08/18/16 1519 16     Temp 08/18/16 1519 97.6 F (36.4 C)     Temp Source 08/18/16 1519 Tympanic     SpO2 08/18/16 1519 99 %     Weight 08/18/16 1517 187 lb (84.8 kg)     Height 08/18/16 1517 5\' 6"  (1.676 m)     Head Circumference --      Peak Flow --      Pain Score 08/18/16 1520 4     Pain Loc --      Pain Edu? --      Excl. in [place]? --    No data  found.   Updated Vital Signs BP (!) 155/92 (BP Location: Left Arm) Comment: Patient states that she has not taken her BP medicine today.  Pulse 64   Temp 97.6 F (36.4 C) (Tympanic)   Resp 16   Ht 5\' 6"  (1.676 m)   Wt 187 lb (84.8 kg)   SpO2 99%   BMI 30.18 kg/m   Visual Acuity Right Eye Distance:   Left Eye Distance:   Bilateral Distance:    Right Eye Near:   Left Eye Near:    Bilateral Near:     Physical Exam  Constitutional: She appears well-developed and well-nourished. No distress.  HENT:  Head: Normocephalic and atraumatic.  Right Ear: Tympanic membrane, external ear and ear canal normal.  Left Ear: Tympanic membrane, external ear and ear canal normal.  Nose: Mucosal edema and rhinorrhea present. No nose lacerations, sinus tenderness, nasal deformity, septal deviation or nasal septal hematoma. No epistaxis.  No foreign bodies. Right sinus exhibits maxillary sinus tenderness and frontal sinus tenderness. Left sinus exhibits maxillary sinus tenderness and frontal sinus tenderness.  Mouth/Throat: Uvula is midline, oropharynx is clear and moist and mucous membranes are normal. No oropharyngeal exudate.  Eyes: Conjunctivae and EOM are normal. Pupils are equal, round, and reactive to light. Right eye exhibits no discharge. Left eye exhibits no discharge. No scleral icterus.  Neck: Normal range of motion. Neck supple. No thyromegaly present.  Cardiovascular: Normal rate, regular rhythm and normal heart sounds.   Pulmonary/Chest: Effort normal and breath sounds normal. No respiratory distress. She has no wheezes. She has no rales.  Lymphadenopathy:    She has no cervical adenopathy.  Skin: She is not diaphoretic.  Nursing note and vitals reviewed.    UC Treatments / Results  Labs (all labs ordered are listed, but only abnormal results are displayed) Labs Reviewed - No data to display  EKG  EKG Interpretation None       Radiology No results  found.  Procedures Procedures (including critical care time)  Medications Ordered in UC Medications - No data to display   Initial Impression / Assessment and Plan / UC Course  I have reviewed the triage vital signs and the nursing notes.  Pertinent labs & imaging results that were available during my care of the patient were reviewed by me and considered in my medical decision making (see chart for details).  Clinical Course      Final Clinical Impressions(s) / UC Diagnoses   Final  diagnoses:  Acute maxillary sinusitis, recurrence not specified    New Prescriptions Discharge Medication List as of 08/18/2016  3:31 PM    START taking these medications   Details  albuterol (PROVENTIL HFA;VENTOLIN HFA) 108 (90 Base) MCG/ACT inhaler Inhale 1-2 puffs into the lungs every 6 (six) hours as needed for wheezing or shortness of breath., Starting Fri 08/18/2016, Normal    benzonatate (TESSALON) 100 MG capsule Take 1 capsule (100 mg total) by mouth every 8 (eight) hours., Starting Fri 08/18/2016, Normal    sulfamethoxazole-trimethoprim (BACTRIM DS,SEPTRA DS) 800-160 MG tablet Take 1 tablet by mouth 2 (two) times daily., Starting Fri 08/18/2016, Normal       1. diagnosis reviewed with patient 2. rx as per orders above; reviewed possible side effects, interactions, risks and benefits  3. Recommend supportive treatment with fluids, rest 4. Follow-up prn if symptoms worsen or don't improve   Norval Gable, MD 08/18/16 1535

## 2016-08-18 NOTE — ED Triage Notes (Signed)
Patient c/o sinus pain and pressure, congestion and cough that started on Monday.

## 2016-08-29 ENCOUNTER — Encounter: Payer: Self-pay | Admitting: Emergency Medicine

## 2016-08-29 ENCOUNTER — Emergency Department
Admission: EM | Admit: 2016-08-29 | Discharge: 2016-08-29 | Disposition: A | Discharge status: Home / Self Care | Payer: 59 | Attending: Emergency Medicine | Admitting: Emergency Medicine

## 2016-08-29 DIAGNOSIS — I10 Essential (primary) hypertension: Secondary | ICD-10-CM | POA: Insufficient documentation

## 2016-08-29 DIAGNOSIS — K529 Noninfective gastroenteritis and colitis, unspecified: Secondary | ICD-10-CM | POA: Insufficient documentation

## 2016-08-29 DIAGNOSIS — Z791 Long term (current) use of non-steroidal anti-inflammatories (NSAID): Secondary | ICD-10-CM | POA: Diagnosis not present

## 2016-08-29 DIAGNOSIS — R112 Nausea with vomiting, unspecified: Secondary | ICD-10-CM | POA: Diagnosis present

## 2016-08-29 DIAGNOSIS — F17210 Nicotine dependence, cigarettes, uncomplicated: Secondary | ICD-10-CM | POA: Diagnosis not present

## 2016-08-29 DIAGNOSIS — Z79899 Other long term (current) drug therapy: Secondary | ICD-10-CM | POA: Diagnosis not present

## 2016-08-29 LAB — COMPREHENSIVE METABOLIC PANEL
ALBUMIN: 4.2 g/dL (ref 3.5–5.0)
ALT: 13 U/L — AB (ref 14–54)
AST: 19 U/L (ref 15–41)
Alkaline Phosphatase: 68 U/L (ref 38–126)
Anion gap: 10 (ref 5–15)
BUN: 10 mg/dL (ref 6–20)
CHLORIDE: 99 mmol/L — AB (ref 101–111)
CO2: 22 mmol/L (ref 22–32)
CREATININE: 1.2 mg/dL — AB (ref 0.44–1.00)
Calcium: 9 mg/dL (ref 8.9–10.3)
GFR calc Af Amer: >60 mL/min (ref >60)
GFR, EST NON AFRICAN AMERICAN: 53 mL/min — AB (ref >60)
GLUCOSE: 104 mg/dL — AB (ref 65–99)
POTASSIUM: 3.6 mmol/L (ref 3.5–5.1)
SODIUM: 131 mmol/L — AB (ref 135–145)
Total Bilirubin: 0.6 mg/dL (ref 0.3–1.2)
Total Protein: 8.2 g/dL — ABNORMAL HIGH (ref 6.5–8.1)

## 2016-08-29 LAB — CBC
HCT: 38.6 % (ref 35.0–47.0)
Hemoglobin: 13.2 g/dL (ref 12.0–16.0)
MCH: 34.4 pg — ABNORMAL HIGH (ref 26.0–34.0)
MCHC: 34.2 g/dL (ref 32.0–36.0)
MCV: 100.5 fL — ABNORMAL HIGH (ref 80.0–100.0)
PLATELETS: 302 10*3/uL (ref 150–440)
RBC: 3.84 MIL/uL (ref 3.80–5.20)
RDW: 15 % — AB (ref 11.5–14.5)
WBC: 16.8 10*3/uL — AB (ref 3.6–11.0)

## 2016-08-29 LAB — LIPASE, BLOOD: LIPASE: 19 U/L (ref 11–51)

## 2016-08-29 MED ORDER — SODIUM CHLORIDE 0.9 % IV SOLN
1000.0000 mL | Freq: Once | INTRAVENOUS | Status: AC
  Administered 2016-08-29: 1000 mL via INTRAVENOUS

## 2016-08-29 MED ORDER — METOCLOPRAMIDE HCL 5 MG PO TABS: 5.0000 mg | ORAL_TABLET | Freq: Three times a day (TID) | ORAL | 0 refills | Status: DC | PRN

## 2016-08-29 MED ORDER — DIPHENHYDRAMINE HCL 50 MG/ML IJ SOLN
12.5000 mg | Freq: Once | INTRAMUSCULAR | Status: AC
  Administered 2016-08-29: 12.5 mg via INTRAVENOUS

## 2016-08-29 MED ORDER — ONDANSETRON HCL 4 MG/2ML IJ SOLN
4.0000 mg | Freq: Once | INTRAMUSCULAR | Status: AC
  Administered 2016-08-29: 4 mg via INTRAVENOUS
  Filled 2016-08-29: qty 2

## 2016-08-29 MED ORDER — DIPHENHYDRAMINE HCL 50 MG/ML IJ SOLN
INTRAMUSCULAR | Status: AC
  Administered 2016-08-29: 12.5 mg via INTRAVENOUS
  Filled 2016-08-29: qty 1

## 2016-08-29 NOTE — ED Notes (Signed)
EDP at bedside  

## 2016-08-29 NOTE — ED Notes (Signed)
Pt resting in bed, awake and alert, resp even and unlabored, pt in no acute distress

## 2016-08-29 NOTE — ED Notes (Signed)
pts right arm redness decreased after benadryl, pt resting in bed, warm blanket given

## 2016-08-29 NOTE — ED Notes (Signed)
Pt states she was diagnosed with a sinus infection last Thursday, states she was given septra DS,states for the past 4 days she has not been able to keep any food down, states vomiting and diarrhea, pt awake and alert in no acute distress

## 2016-08-29 NOTE — ED Provider Notes (Addendum)
[facility] Emergency Department Provider Note   ____________________________________________    I have reviewed the triage vital signs and the nursing notes.   HISTORY  Chief Complaint Emesis   HPI Amy Logan is a 45 y.o. female who presents with complaints of nausea vomiting and diarrhea. Patient reports that she has been treated for a sinus infection with Septra which she started approximately 4-5 days ago. Approximately 4 days ago she developed nausea vomiting and diarrhea, she complains of mild abdominal cramping but has no pain currently. She denies sick contacts. She feels dehydrated. No fevers or chills. No recent travel.   Past Medical History:  Diagnosis Date  . Anxiety   . Bradycardia   . Fibromyalgia   . Hypertension   . Neuropathy ([place])     There are no active problems to display for this patient.   Past Surgical History:  Procedure Laterality Date  . ABDOMINAL HYSTERECTOMY    . APPENDECTOMY    . CHOLECYSTECTOMY    . JOINT REPLACEMENT    . REPLACEMENT TOTAL KNEE     Right knee    Prior to Admission medications   Medication Sig Start Date End Date Taking? Authorizing Provider  albuterol (PROVENTIL HFA;VENTOLIN HFA) 108 (90 Base) MCG/ACT inhaler Inhale 1-2 puffs into the lungs every 6 (six) hours as needed for wheezing or shortness of breath. 08/18/16   Norval Gable, MD  amitriptyline (ELAVIL) 25 MG tablet Take 25 mg by mouth at bedtime.    Historical Provider, MD  benzonatate (TESSALON) 100 MG capsule Take 1 capsule (100 mg total) by mouth every 8 (eight) hours. 08/18/16   Norval Gable, MD  DULoxetine HCl (CYMBALTA PO) Take 90 mg by mouth.    Historical Provider, MD  fluticasone (FLONASE) 50 MCG/ACT nasal spray Place 2 sprays into both nostrils daily. 11/04/15   Lorin Picket, PA-C  lisinopril-hydrochlorothiazide (PRINZIDE,ZESTORETIC) 20-25 MG per tablet Take 1 tablet by mouth daily.    Historical Provider, MD  LORazepam  (ATIVAN) 1 MG tablet Take 1 mg by mouth every 6 (six) hours as needed for anxiety.    Historical Provider, MD  meloxicam (MOBIC) 15 MG tablet Take 15 mg by mouth daily.    Historical Provider, MD  metoCLOPramide (REGLAN) 5 MG tablet Take 1 tablet (5 mg total) by mouth every 8 (eight) hours as needed for nausea or vomiting. 08/29/16 09/03/16  Lavonia Drafts, MD  sulfamethoxazole-trimethoprim (BACTRIM DS,SEPTRA DS) 800-160 MG tablet Take 1 tablet by mouth 2 (two) times daily. 08/18/16   Norval Gable, MD  traMADol (ULTRAM) 50 MG tablet Take 50 mg by mouth every 6 (six) hours as needed.    Historical Provider, MD     Allergies Demerol [meperidine]; Morphine and related; Phenergan [promethazine hcl]; Stadol [butorphanol]; Zofran [ondansetron hcl]; and Imitrex [sumatriptan]  Family History  Problem Relation Age of Onset  . Diabetes Mother   . Heart failure Mother   . Asthma Mother   . Cancer Father     Social History Social History  Substance Use Topics  . Smoking status: Current Every Day Smoker    Packs/day: 0.50    Types: Cigarettes  . Smokeless tobacco: Never Used  . Alcohol use No    Review of Systems  Constitutional: No fever/chills  ENT: No sore throat.Sinus pressure Cardiovascular: Denies chest pain. Respiratory: Denies shortness of breath. Intermittent cough Gastrointestinal: No abdominal pain.  As above Genitourinary: Negative for dysuria. No flank pain Musculoskeletal: Negative for back pain.  Skin: Negative for rash. Neurological: Negative for headaches   10-point ROS otherwise negative.  ____________________________________________   PHYSICAL EXAM:  VITAL SIGNS: ED Triage Vitals  Enc Vitals Group     BP 08/29/16 0730 124/83     Pulse Rate 08/29/16 0730 88     Resp 08/29/16 0730 18     Temp 08/29/16 0744 98.2 F (36.8 C)     Temp Source 08/29/16 0744 Oral     SpO2 08/29/16 0730 98 %     Weight 08/29/16 0730 186 lb (84.4 kg)     Height 08/29/16 0730 5\' 6"   (1.676 m)     Head Circumference --      Peak Flow --      Pain Score 08/29/16 0731 0     Pain Loc --      Pain Edu? --      Excl. in [place]? --     Constitutional: Alert and oriented. No acute distress. Pleasant and interactive Eyes: Conjunctivae are normal.   Nose: No congestion/rhinnorhea. Mouth/Throat: Mucous membranes are Dry    Cardiovascular: Normal rate, regular rhythm. Grossly normal heart sounds.  Good peripheral circulation. Respiratory: Normal respiratory effort.  No retractions. Lungs CTAB. Gastrointestinal: Soft and nontender. No distention.  No CVA tenderness. Genitourinary: deferred Musculoskeletal: No lower extremity tenderness nor edema.  Warm and well perfused Neurologic:  Normal speech and language. Skin:  Skin is warm, dry and intact. No rash noted. Psychiatric: Mood and affect are normal. Speech and behavior are normal.  ____________________________________________   LABS (all labs ordered are listed, but only abnormal results are displayed)  Labs Reviewed  CBC - Abnormal; Notable for the following:       Result Value   WBC 16.8 (*)    MCV 100.5 (*)    MCH 34.4 (*)    RDW 15.0 (*)    All other components within normal limits  COMPREHENSIVE METABOLIC PANEL - Abnormal; Notable for the following:    Sodium 131 (*)    Chloride 99 (*)    Glucose, Bld 104 (*)    Creatinine, Ser 1.20 (*)    Total Protein 8.2 (*)    ALT 13 (*)    GFR calc non Af Amer 53 (*)    All other components within normal limits  LIPASE, BLOOD   ____________________________________________  EKG  None ____________________________________________  RADIOLOGY  None ____________________________________________   PROCEDURES  Procedure(s) performed: No    Critical Care performed: No ____________________________________________   INITIAL IMPRESSION / ASSESSMENT AND PLAN / ED COURSE  Pertinent labs & imaging results that were available during my care of the patient were  reviewed by me and considered in my medical decision making (see chart for details).  Patient presents with nausea vomiting and diarrhea, suspect viral gastroenteritis which is rampant in the area this time. Could also be a side effect from antibiotics but one is less likely. We will treat with IV fluids, IV Zofran, check labs and reevaluate.  Clinical Course  Patient had localized red rash developed around IV site after Zofran was instilled. No intraoral swelling or throat pain. 12.5 mg IV Benadryl given, patient reports improvement ____________________________________________ Rash resolved. Patient reports her nausea and vomiting is much better. She feels well. Lab work only significant for elevated white blood cell count likely related to her viral illness: We did discuss return precautions and patient expressed understanding  FINAL CLINICAL IMPRESSION(S) / ED DIAGNOSES  Final diagnoses:  Gastroenteritis  NEW MEDICATIONS STARTED DURING THIS VISIT:  Discharge Medication List as of 08/29/2016 10:39 AM    START taking these medications   Details  metoCLOPramide (REGLAN) 5 MG tablet Take 1 tablet (5 mg total) by mouth every 8 (eight) hours as needed for nausea or vomiting., Starting Tue 08/29/2016, Until Sun 09/03/2016, Print         Note:  This document was prepared using Dragon voice recognition software and may include unintentional dictation errors.    Lavonia Drafts, MD 08/29/16 [id number]    Lavonia Drafts, MD 08/29/16 1230

## 2016-08-29 NOTE — ED Notes (Signed)
After zofran admin pt hit call bell stating she had redness and itching to right arm, EDP notified, meds given, pt given call bell for strict call back

## 2016-08-29 NOTE — ED Triage Notes (Signed)
Reports n/v/d x 3 days

## 2016-11-20 DIAGNOSIS — E559 Vitamin D deficiency, unspecified: Secondary | ICD-10-CM | POA: Insufficient documentation

## 2016-11-20 DIAGNOSIS — E538 Deficiency of other specified B group vitamins: Secondary | ICD-10-CM | POA: Insufficient documentation

## 2016-11-20 HISTORY — PX: COLONOSCOPY: SHX174

## 2016-12-15 ENCOUNTER — Other Ambulatory Visit: Payer: Self-pay | Admitting: Nurse Practitioner

## 2016-12-15 DIAGNOSIS — Z1231 Encounter for screening mammogram for malignant neoplasm of breast: Secondary | ICD-10-CM

## 2016-12-26 ENCOUNTER — Ambulatory Visit
Admission: RE | Admit: 2016-12-26 | Discharge: 2016-12-26 | Disposition: A | Discharge status: Home / Self Care | Payer: 59 | Source: Ambulatory Visit | Attending: Nurse Practitioner | Admitting: Nurse Practitioner

## 2016-12-26 DIAGNOSIS — Z1231 Encounter for screening mammogram for malignant neoplasm of breast: Secondary | ICD-10-CM | POA: Diagnosis not present

## 2016-12-26 HISTORY — DX: Malignant (primary) neoplasm, unspecified: C80.1

## 2017-03-24 ENCOUNTER — Ambulatory Visit
Admission: EM | Admit: 2017-03-24 | Discharge: 2017-03-24 | Disposition: A | Discharge status: Home / Self Care | Payer: 59 | Attending: Family Medicine | Admitting: Family Medicine

## 2017-03-24 ENCOUNTER — Encounter: Payer: Self-pay | Admitting: Emergency Medicine

## 2017-03-24 DIAGNOSIS — J0100 Acute maxillary sinusitis, unspecified: Secondary | ICD-10-CM

## 2017-03-24 MED ORDER — SULFAMETHOXAZOLE-TRIMETHOPRIM 800-160 MG PO TABS: 1.0000 | ORAL_TABLET | Freq: Two times a day (BID) | ORAL | 0 refills | Status: DC

## 2017-03-24 MED ORDER — BENZONATATE 200 MG PO CAPS: 200.0000 mg | ORAL_CAPSULE | Freq: Three times a day (TID) | ORAL | 0 refills | Status: DC | PRN

## 2017-03-24 NOTE — ED Provider Notes (Signed)
[facility]    CSN: [id number] Arrival date & time: 03/24/17  0817     History   Chief Complaint Chief Complaint  Patient presents with  . Facial Pain    HPI Amy Logan is a 46 y.o. female.   Patient is a 40 year-year-old white female with a history of sinus drainage nasal congestion started on Wednesday. She states she gets this about twice a year when this happens she needs Septra for 10 days and Tessalon Perles that usually will not get out. She states the Z-Pak is failed for the past. She is allergic to multiple drugs but few antibiotics. She reports blowing green stuff from her nose and coughing up yellowish green material from the lungs as well. She is having pressure on her face having facial pain. No fever. She does smoke.. Surgery abdominal hysterectomy appendectomy cyst aspirations cholecystectomyand joint replacement. She's had a history of fibromyalgia neuropathy hypertension and anxiety. Mother with diabetes and heart failure and asthma.   The history is provided by the patient. No language interpreter was used.  Cough  Cough characteristics:  Productive Sputum characteristics:  Green and yellow Severity:  Moderate Duration:  4 days Timing:  Constant Progression:  Worsening Chronicity:  New Smoker: yes   Relieved by:  Nothing Worsened by:  Nothing   Past Medical History:  Diagnosis Date  . Anxiety   . Bradycardia   . Cancer ([place]) 1990   cervical cells stage 4  . Fibromyalgia   . Hypertension   . Neuropathy     There are no active problems to display for this patient.   Past Surgical History:  Procedure Laterality Date  . ABDOMINAL HYSTERECTOMY    . APPENDECTOMY    . BREAST CYST ASPIRATION Left    neg  . CHOLECYSTECTOMY    . JOINT REPLACEMENT    . REPLACEMENT TOTAL KNEE     Right knee    OB History    No data available       Home Medications    Prior to Admission medications   Medication Sig Start Date End Date Taking?  Authorizing Provider  albuterol (PROVENTIL HFA;VENTOLIN HFA) 108 (90 Base) MCG/ACT inhaler Inhale 1-2 puffs into the lungs every 6 (six) hours as needed for wheezing or shortness of breath. 08/18/16  Yes Conty, [place], MD  ALPRAZolam (NIRAVAM) 0.5 MG dissolvable tablet Take 0.5 mg by mouth at bedtime as needed for anxiety.   Yes [provider]  DULoxetine HCl (CYMBALTA PO) Take 90 mg by mouth.   Yes [provider]  lisinopril-hydrochlorothiazide (PRINZIDE,ZESTORETIC) 20-25 MG per tablet Take 1 tablet by mouth daily.   Yes [provider]  meloxicam (MOBIC) 15 MG tablet Take 15 mg by mouth daily.   Yes [provider]  naproxen (NAPROSYN) 500 MG tablet Take 500 mg by mouth 2 (two) times daily with a meal.   Yes [provider]  traMADol (ULTRAM) 50 MG tablet Take 50 mg by mouth every 6 (six) hours as needed.   Yes [provider]  amitriptyline (ELAVIL) 25 MG tablet Take 25 mg by mouth at bedtime.    [provider]  benzonatate (TESSALON) 100 MG capsule Take 1 capsule (100 mg total) by mouth every 8 (eight) hours. 08/18/16   Norval Gable, MD  benzonatate (TESSALON) 200 MG capsule Take 1 capsule (200 mg total) by mouth 3 (three) times daily as needed. 03/24/17   Frederich Cha, MD  fluticasone (FLONASE) 50 MCG/ACT  nasal spray Place 2 sprays into both nostrils daily. 11/04/15   Lorin Picket, PA-C  LORazepam (ATIVAN) 1 MG tablet Take 1 mg by mouth every 6 (six) hours as needed for anxiety.    [provider]  metoCLOPramide (REGLAN) 5 MG tablet Take 1 tablet (5 mg total) by mouth every 8 (eight) hours as needed for nausea or vomiting. 08/29/16 09/03/16  Lavonia Drafts, MD  sulfamethoxazole-trimethoprim (BACTRIM DS,SEPTRA DS) 800-160 MG tablet Take 1 tablet by mouth 2 (two) times daily. 08/18/16   Norval Gable, MD  sulfamethoxazole-trimethoprim (BACTRIM DS,SEPTRA DS) 800-160 MG tablet Take 1 tablet by mouth 2 (two) times daily.  03/24/17   Frederich Cha, MD    Family History Family History  Problem Relation Age of Onset  . Diabetes Mother   . Heart failure Mother   . Asthma Mother   . Cancer Father   . Breast cancer Cousin 41    mat cousin    Social History Social History  Substance Use Topics  . Smoking status: Current Every Day Smoker    Packs/day: 0.50    Types: Cigarettes  . Smokeless tobacco: Never Used  . Alcohol use No     Allergies   Demerol [meperidine]; Morphine and related; Phenergan [promethazine hcl]; Stadol [butorphanol]; Zofran [ondansetron hcl]; and Imitrex [sumatriptan]   Review of Systems Review of Systems  HENT: Positive for facial swelling, sinus pain and sinus pressure.   Respiratory: Positive for cough.   All other systems reviewed and are negative.    Physical Exam Triage Vital Signs ED Triage Vitals  Enc Vitals Group     BP 03/24/17 0840 132/80     Pulse Rate 03/24/17 0840 67     Resp 03/24/17 0840 16     Temp 03/24/17 0840 97.8 F (36.6 C)     Temp Source 03/24/17 0840 Tympanic     SpO2 03/24/17 0840 96 %     Weight 03/24/17 0842 191 lb (86.6 kg)     Height 03/24/17 0842 5\' 6"  (1.676 m)     Head Circumference --      Peak Flow --      Pain Score --      Pain Loc --      Pain Edu? --      Excl. in [place]? --    No data found.   Updated Vital Signs BP 132/80 (BP Location: Left Arm)   Pulse 67   Temp 97.8 F (36.6 C) (Tympanic)   Resp 16   Ht 5\' 6"  (1.676 m)   Wt 191 lb (86.6 kg)   SpO2 96%   BMI 30.83 kg/m   Visual Acuity Right Eye Distance:   Left Eye Distance:   Bilateral Distance:    Right Eye Near:   Left Eye Near:    Bilateral Near:     Physical Exam  Constitutional: She is oriented to person, place, and time. She appears well-developed and well-nourished.  HENT:  Head: Normocephalic and atraumatic.  Right Ear: Hearing, tympanic membrane, external ear and ear canal normal.  Left Ear: Hearing, tympanic membrane and ear canal normal.    Nose: Mucosal edema and rhinorrhea present. Right sinus exhibits maxillary sinus tenderness. Right sinus exhibits no frontal sinus tenderness. Left sinus exhibits maxillary sinus tenderness. Left sinus exhibits no frontal sinus tenderness.  Mouth/Throat: Uvula is midline, oropharynx is clear and moist and mucous membranes are normal. No uvula swelling.  Eyes: Pupils are equal, round, and reactive to light.  Neck: Normal range of motion.  Cardiovascular: Normal rate and regular rhythm.   Pulmonary/Chest: Breath sounds normal.  Musculoskeletal: Normal range of motion.  Lymphadenopathy:    She has cervical adenopathy.  Neurological: She is alert and oriented to person, place, and time.  Skin: Skin is warm.  Psychiatric: She has a normal mood and affect.  Vitals reviewed.    UC Treatments / Results  Labs (all labs ordered are listed, but only abnormal results are displayed) Labs Reviewed - No data to display  EKG  EKG Interpretation None       Radiology No results found.  Procedures Procedures (including critical care time)  Medications Ordered in UC Medications - No data to display   Initial Impression / Assessment and Plan / UC Course  I have reviewed the triage vital signs and the nursing notes.  Pertinent labs & imaging results that were available during my care of the patient were reviewed by me and considered in my medical decision making (see chart for details).  I have explained to the patient this may be mostly viral user don't prescribe anabiotic's before 710 days of symptoms but after discussion with her I will relent and give a prescription for Septra DS for 10 days. Tessalon Perles 3 times a day for 10 days. Recommend she talk to her PCP about prescribing this treatment protocol in the future if this is truly a recurrent problem   Final Clinical Impressions(s) / UC Diagnoses   Final diagnoses:  Acute non-recurrent maxillary sinusitis    New  Prescriptions Discharge Medication List as of 03/24/2017  9:04 AM    START taking these medications   Details  !! benzonatate (TESSALON) 200 MG capsule Take 1 capsule (200 mg total) by mouth 3 (three) times daily as needed., Starting Sat 03/24/2017, Normal    !! sulfamethoxazole-trimethoprim (BACTRIM DS,SEPTRA DS) 800-160 MG tablet Take 1 tablet by mouth 2 (two) times daily., Starting Sat 03/24/2017, Normal     !! - Potential duplicate medications found. Please discuss with provider.       Note: This dictation was prepared with Dragon dictation along with smaller phrase technology. Any transcriptional errors that result from this process are unintentional.   Frederich Cha, MD 03/24/17 [phone]

## 2017-03-24 NOTE — ED Triage Notes (Signed)
Sinus infection for 3 days

## 2017-04-23 ENCOUNTER — Emergency Department
Admission: EM | Admit: 2017-04-23 | Discharge: 2017-04-23 | Disposition: A | Discharge status: Home / Self Care | Payer: 59 | Attending: Dermatology | Admitting: Dermatology

## 2017-04-23 ENCOUNTER — Encounter: Payer: Self-pay | Admitting: Emergency Medicine

## 2017-04-23 DIAGNOSIS — R1110 Vomiting, unspecified: Secondary | ICD-10-CM | POA: Diagnosis not present

## 2017-04-23 DIAGNOSIS — Z79899 Other long term (current) drug therapy: Secondary | ICD-10-CM | POA: Insufficient documentation

## 2017-04-23 DIAGNOSIS — F17210 Nicotine dependence, cigarettes, uncomplicated: Secondary | ICD-10-CM | POA: Diagnosis not present

## 2017-04-23 DIAGNOSIS — I10 Essential (primary) hypertension: Secondary | ICD-10-CM | POA: Diagnosis not present

## 2017-04-23 DIAGNOSIS — Z5321 Procedure and treatment not carried out due to patient leaving prior to being seen by health care provider: Secondary | ICD-10-CM | POA: Diagnosis not present

## 2017-04-23 LAB — COMPREHENSIVE METABOLIC PANEL
ALBUMIN: 4.3 g/dL (ref 3.5–5.0)
ALT: 17 U/L (ref 14–54)
ANION GAP: 8 (ref 5–15)
AST: 19 U/L (ref 15–41)
Alkaline Phosphatase: 66 U/L (ref 38–126)
BILIRUBIN TOTAL: 1.2 mg/dL (ref 0.3–1.2)
BUN: 17 mg/dL (ref 6–20)
CO2: 25 mmol/L (ref 22–32)
Calcium: 9.1 mg/dL (ref 8.9–10.3)
Chloride: 98 mmol/L — ABNORMAL LOW (ref 101–111)
Creatinine, Ser: 1.12 mg/dL — ABNORMAL HIGH (ref 0.44–1.00)
GFR calc Af Amer: >60 mL/min (ref >60)
GFR calc non Af Amer: 58 mL/min — ABNORMAL LOW (ref >60)
GLUCOSE: 97 mg/dL (ref 65–99)
POTASSIUM: 4.3 mmol/L (ref 3.5–5.1)
SODIUM: 131 mmol/L — AB (ref 135–145)
Total Protein: 8.7 g/dL — ABNORMAL HIGH (ref 6.5–8.1)

## 2017-04-23 LAB — URINALYSIS, COMPLETE (UACMP) WITH MICROSCOPIC
BILIRUBIN URINE: NEGATIVE
Glucose, UA: NEGATIVE mg/dL
Ketones, ur: NEGATIVE mg/dL
Leukocytes, UA: NEGATIVE
NITRITE: NEGATIVE
PH: 6 (ref 5.0–8.0)
Protein, ur: NEGATIVE mg/dL
SPECIFIC GRAVITY, URINE: 1.012 (ref 1.005–1.030)

## 2017-04-23 LAB — CBC
HEMATOCRIT: 41.1 % (ref 35.0–47.0)
HEMOGLOBIN: 14.1 g/dL (ref 12.0–16.0)
MCH: 32.6 pg (ref 26.0–34.0)
MCHC: 34.5 g/dL (ref 32.0–36.0)
MCV: 94.6 fL (ref 80.0–100.0)
Platelets: 278 10*3/uL (ref 150–440)
RBC: 4.34 MIL/uL (ref 3.80–5.20)
RDW: 14.6 % — AB (ref 11.5–14.5)
WBC: 16.5 10*3/uL — ABNORMAL HIGH (ref 3.6–11.0)

## 2017-04-23 LAB — LIPASE, BLOOD: Lipase: 26 U/L (ref 11–51)

## 2017-04-23 NOTE — ED Triage Notes (Signed)
Patient states has current sinus infection (x 2 weeks). Is taking bactrim for infection.  States feeling worse.  Vomiting, feeling weak x 4 days.  AAOx3.  Skin warm and dry.  ambulates with easy and steady gait.  Drinking mountain dew and gatorade in triage.

## 2017-04-23 NOTE — ED Notes (Signed)
Pt came to desk and states she got an appt with her PCP, pt left, encouraged to stay

## 2017-08-15 ENCOUNTER — Encounter: Payer: Self-pay | Admitting: *Deleted

## 2017-08-15 ENCOUNTER — Ambulatory Visit
Admission: EM | Admit: 2017-08-15 | Discharge: 2017-08-15 | Disposition: A | Discharge status: Home / Self Care | Payer: 59 | Attending: Family Medicine | Admitting: Family Medicine

## 2017-08-15 DIAGNOSIS — G43009 Migraine without aura, not intractable, without status migrainosus: Secondary | ICD-10-CM

## 2017-08-15 DIAGNOSIS — S29012A Strain of muscle and tendon of back wall of thorax, initial encounter: Secondary | ICD-10-CM

## 2017-08-15 MED ORDER — KETOROLAC TROMETHAMINE 60 MG/2ML IM SOLN
60.0000 mg | Freq: Once | INTRAMUSCULAR | Status: AC
  Administered 2017-08-15: 60 mg via INTRAMUSCULAR

## 2017-08-15 MED ORDER — KETOROLAC TROMETHAMINE 10 MG PO TABS: 10.0000 mg | ORAL_TABLET | Freq: Three times a day (TID) | ORAL | 0 refills | Status: DC | PRN

## 2017-08-15 MED ORDER — CYCLOBENZAPRINE HCL 10 MG PO TABS: 10.0000 mg | ORAL_TABLET | Freq: Every day | ORAL | 0 refills | Status: DC

## 2017-08-15 NOTE — ED Triage Notes (Signed)
Migraine headache x2 days, also last night began having a "burning" sensation between her shoulder blades. Has hx of migraines.

## 2017-08-15 NOTE — ED Provider Notes (Signed)
[facility]    CSN: [id number] Arrival date & time: 08/15/17  0900     History   Chief Complaint Chief Complaint  Patient presents with  . Headache  . Back Pain    HPI Amy Logan is a 46 y.o. female.   The history is provided by the patient.  Headache  Pain location:  R parietal Quality:  Unable to specify Radiates to:  Does not radiate Severity currently:  7/10 Severity at highest:  9/10 Onset quality:  Sudden Duration:  2 days Timing:  Constant Progression:  Unchanged Chronicity:  New Similar to prior headaches: yes (similar to prior migraines)   Worsened by:  Light Ineffective treatments:  None tried Associated symptoms: back pain (complains of upper back pain, between shoulder blades, but unrelated to headache), nausea and photophobia   Associated symptoms: no abdominal pain, no blurred vision, no congestion, no cough, no diarrhea, no dizziness, no drainage, no ear pain, no eye pain, no facial pain, no fatigue, no fever, no focal weakness, no hearing loss, no loss of balance, no myalgias, no near-syncope, no neck pain, no neck stiffness, no numbness, no paresthesias, no seizures, no sinus pressure, no sore throat, no swollen glands, no syncope, no tingling, no URI, no visual change, no vomiting and no weakness   Back Pain  Associated symptoms: headaches   Associated symptoms: no abdominal pain, no fever, no numbness, no paresthesias and no weakness     Past Medical History:  Diagnosis Date  . Anxiety   . Bradycardia   . Cancer ([place]) 1990   cervical cells stage 4  . Fibromyalgia   . Hypertension   . Neuropathy     There are no active problems to display for this patient.   Past Surgical History:  Procedure Laterality Date  . ABDOMINAL HYSTERECTOMY    . APPENDECTOMY    . BREAST CYST ASPIRATION Left    neg  . CHOLECYSTECTOMY    . JOINT REPLACEMENT    . REPLACEMENT TOTAL KNEE     Right knee    OB History    No data available        Home Medications    Prior to Admission medications   Medication Sig Start Date End Date Taking? Authorizing Provider  DULoxetine HCl (CYMBALTA PO) Take 90 mg by mouth.   Yes [provider]  lisinopril-hydrochlorothiazide (PRINZIDE,ZESTORETIC) 20-25 MG per tablet Take 1 tablet by mouth daily.   Yes [provider]  albuterol (PROVENTIL HFA;VENTOLIN HFA) 108 (90 Base) MCG/ACT inhaler Inhale 1-2 puffs into the lungs every 6 (six) hours as needed for wheezing or shortness of breath. 08/18/16   Norval Gable, MD  ALPRAZolam (NIRAVAM) 0.5 MG dissolvable tablet Take 0.5 mg by mouth at bedtime as needed for anxiety.    [provider]  amitriptyline (ELAVIL) 25 MG tablet Take 25 mg by mouth at bedtime.    [provider]  benzonatate (TESSALON) 100 MG capsule Take 1 capsule (100 mg total) by mouth every 8 (eight) hours. 08/18/16   Norval Gable, MD  benzonatate (TESSALON) 200 MG capsule Take 1 capsule (200 mg total) by mouth 3 (three) times daily as needed. 03/24/17   Frederich Cha, MD  cyclobenzaprine (FLEXERIL) 10 MG tablet Take 1 tablet (10 mg total) by mouth at bedtime. 08/15/17   Norval Gable, MD  fluticasone (FLONASE) 50 MCG/ACT nasal spray Place 2 sprays into both nostrils daily. 11/04/15   Lorin Picket, PA-C  ketorolac (TORADOL) 10  MG tablet Take 1 tablet (10 mg total) by mouth every 8 (eight) hours as needed. 08/15/17   Norval Gable, MD  LORazepam (ATIVAN) 1 MG tablet Take 1 mg by mouth every 6 (six) hours as needed for anxiety.    [provider]  meloxicam (MOBIC) 15 MG tablet Take 15 mg by mouth daily.    [provider]  metoCLOPramide (REGLAN) 5 MG tablet Take 1 tablet (5 mg total) by mouth every 8 (eight) hours as needed for nausea or vomiting. 08/29/16 09/03/16  Lavonia Drafts, MD  naproxen (NAPROSYN) 500 MG tablet Take 500 mg by mouth 2 (two) times daily with a meal.    [provider]   sulfamethoxazole-trimethoprim (BACTRIM DS,SEPTRA DS) 800-160 MG tablet Take 1 tablet by mouth 2 (two) times daily. 08/18/16   Norval Gable, MD  sulfamethoxazole-trimethoprim (BACTRIM DS,SEPTRA DS) 800-160 MG tablet Take 1 tablet by mouth 2 (two) times daily. 03/24/17   Frederich Cha, MD  traMADol (ULTRAM) 50 MG tablet Take 50 mg by mouth every 6 (six) hours as needed.    [provider]    Family History Family History  Problem Relation Age of Onset  . Diabetes Mother   . Heart failure Mother   . Asthma Mother   . Cancer Father   . Breast cancer Cousin 60       mat cousin    Social History Social History  Substance Use Topics  . Smoking status: Current Every Day Smoker    Packs/day: 0.50    Types: Cigarettes  . Smokeless tobacco: Never Used  . Alcohol use No     Allergies   Demerol [meperidine]; Morphine and related; Phenergan [promethazine hcl]; Stadol [butorphanol]; Zofran [ondansetron hcl]; and Imitrex [sumatriptan]   Review of Systems Review of Systems  Constitutional: Negative for fatigue and fever.  HENT: Negative for congestion, ear pain, hearing loss, postnasal drip, sinus pressure and sore throat.   Eyes: Positive for photophobia. Negative for blurred vision and pain.  Respiratory: Negative for cough.   Cardiovascular: Negative for syncope and near-syncope.  Gastrointestinal: Positive for nausea. Negative for abdominal pain, diarrhea and vomiting.  Musculoskeletal: Positive for back pain (complains of upper back pain, between shoulder blades, but unrelated to headache). Negative for myalgias, neck pain and neck stiffness.  Neurological: Positive for headaches. Negative for dizziness, focal weakness, seizures, weakness, numbness, paresthesias and loss of balance.     Physical Exam Triage Vital Signs ED Triage Vitals  Enc Vitals Group     BP 08/15/17 0914 (!) 142/86     Pulse Rate 08/15/17 0914 62     Resp 08/15/17 0914 16     Temp 08/15/17 0914 98.6  F (37 C)     Temp Source 08/15/17 0914 Oral     SpO2 08/15/17 0914 100 %     Weight 08/15/17 0916 194 lb (88 kg)     Height 08/15/17 0916 5\' 6"  (1.676 m)     Head Circumference --      Peak Flow --      Pain Score 08/15/17 0918 6     Pain Loc --      Pain Edu? --      Excl. in [place]? --    No data found.   Updated Vital Signs BP (!) 142/86 (BP Location: Left Arm)   Pulse 62   Temp 98.6 F (37 C) (Oral)   Resp 16   Ht 5\' 6"  (1.676 m)   Wt 194  lb (88 kg)   SpO2 100%   BMI 31.31 kg/m   Visual Acuity Right Eye Distance:   Left Eye Distance:   Bilateral Distance:    Right Eye Near:   Left Eye Near:    Bilateral Near:     Physical Exam  Constitutional: She is oriented to person, place, and time. She appears well-developed and well-nourished. No distress.  HENT:  Head: Normocephalic and atraumatic.  Right Ear: Tympanic membrane, external ear and ear canal normal.  Left Ear: Tympanic membrane, external ear and ear canal normal.  Nose: No mucosal edema, rhinorrhea, nose lacerations, sinus tenderness, nasal deformity, septal deviation or nasal septal hematoma. No epistaxis.  No foreign bodies. Right sinus exhibits no maxillary sinus tenderness and no frontal sinus tenderness. Left sinus exhibits no maxillary sinus tenderness and no frontal sinus tenderness.  Mouth/Throat: Uvula is midline, oropharynx is clear and moist and mucous membranes are normal. No oropharyngeal exudate.  Eyes: Pupils are equal, round, and reactive to light. Conjunctivae and EOM are normal. Right eye exhibits no discharge. Left eye exhibits no discharge. No scleral icterus.  Neck: Normal range of motion. Neck supple. No thyromegaly present.  Cardiovascular: Normal rate, regular rhythm and normal heart sounds.   Pulmonary/Chest: Effort normal and breath sounds normal. No respiratory distress. She has no wheezes. She has no rales.  Lymphadenopathy:    She has no cervical adenopathy.  Neurological: She is  alert and oriented to person, place, and time. She displays normal reflexes. No cranial nerve deficit or sensory deficit. She exhibits normal muscle tone. Coordination normal.  Skin: She is not diaphoretic.  Nursing note and vitals reviewed.    UC Treatments / Results  Labs (all labs ordered are listed, but only abnormal results are displayed) Labs Reviewed - No data to display  EKG  EKG Interpretation None       Radiology No results found.  Procedures Procedures (including critical care time)  Medications Ordered in UC Medications  ketorolac (TORADOL) injection 60 mg (60 mg Intramuscular Given 08/15/17 0934)     Initial Impression / Assessment and Plan / UC Course  I have reviewed the triage vital signs and the nursing notes.  Pertinent labs & imaging results that were available during my care of the patient were reviewed by me and considered in my medical decision making (see chart for details).       Final Clinical Impressions(s) / UC Diagnoses   Final diagnoses:  Migraine without aura and without status migrainosus, not intractable  Upper back strain, initial encounter    New Prescriptions Discharge Medication List as of 08/15/2017  9:50 AM    START taking these medications   Details  cyclobenzaprine (FLEXERIL) 10 MG tablet Take 1 tablet (10 mg total) by mouth at bedtime., Starting Wed 08/15/2017, Normal    ketorolac (TORADOL) 10 MG tablet Take 1 tablet (10 mg total) by mouth every 8 (eight) hours as needed., Starting Wed 08/15/2017, Normal       1. diagnosis reviewed with patient; patient given toradol 60mg  IM x 1 with improvement of symptoms 2. rx as per orders above; reviewed possible side effects, interactions, risks and benefits  3. Recommend supportive treatment with rest, heat/ice to upper back area 4. Follow-up prn if symptoms worsen or don't improve  Controlled Substance Prescriptions [place] Controlled Substance Registry consulted? Not Applicable    Norval Gable, MD 08/15/17 1000

## 2017-12-04 ENCOUNTER — Ambulatory Visit
Admission: EM | Admit: 2017-12-04 | Discharge: 2017-12-04 | Disposition: A | Discharge status: Home / Self Care | Payer: 59 | Attending: Family Medicine | Admitting: Family Medicine

## 2017-12-04 ENCOUNTER — Other Ambulatory Visit: Payer: Self-pay

## 2017-12-04 ENCOUNTER — Ambulatory Visit (INDEPENDENT_AMBULATORY_CARE_PROVIDER_SITE_OTHER): Payer: 59

## 2017-12-04 DIAGNOSIS — M1712 Unilateral primary osteoarthritis, left knee: Secondary | ICD-10-CM | POA: Diagnosis not present

## 2017-12-04 DIAGNOSIS — M25562 Pain in left knee: Secondary | ICD-10-CM | POA: Diagnosis not present

## 2017-12-04 MED ORDER — DICLOFENAC SODIUM 75 MG PO TBEC: 75.0000 mg | DELAYED_RELEASE_TABLET | Freq: Two times a day (BID) | ORAL | 0 refills | Status: DC

## 2017-12-04 MED ORDER — TRAMADOL HCL 50 MG PO TABS: 50.0000 mg | ORAL_TABLET | Freq: Three times a day (TID) | ORAL | 0 refills | Status: DC | PRN

## 2017-12-04 NOTE — ED Provider Notes (Signed)
[facility]    CSN: [id number] Arrival date & time: 12/04/17  1624  History   Chief Complaint Chief Complaint  Patient presents with  . Knee Pain   HPI  47 year old female presents with knee pain.  Left knee pain  Patient reports she has had left knee pain for 2 years.  Has been worse over the past 3 weeks. Currently 6/10 in severity.  Worse with range of motion.  She states that she has difficulty extending her leg fully.  No reports of swelling.  She has been icing the area and using naproxen without relief.  No recent fall, trauma, injury.  No other associated symptoms.  No other complaints or concerns at this time.  Past Medical History:  Diagnosis Date  . Anxiety   . Bradycardia   . Cancer ([place]) 1990   cervical cells stage 4  . Fibromyalgia   . Hypertension   . Neuropathy    Past Surgical History:  Procedure Laterality Date  . ABDOMINAL HYSTERECTOMY    . APPENDECTOMY    . BREAST CYST ASPIRATION Left    neg  . CHOLECYSTECTOMY    . JOINT REPLACEMENT    . REPLACEMENT TOTAL KNEE     Right knee    OB History    No data available       Home Medications    Prior to Admission medications   Medication Sig Start Date End Date Taking? Authorizing Provider  albuterol (PROVENTIL HFA;VENTOLIN HFA) 108 (90 Base) MCG/ACT inhaler Inhale 1-2 puffs into the lungs every 6 (six) hours as needed for wheezing or shortness of breath. 08/18/16   Norval Gable, MD  cyclobenzaprine (FLEXERIL) 10 MG tablet Take 1 tablet (10 mg total) by mouth at bedtime. 08/15/17   Norval Gable, MD  diclofenac (VOLTAREN) 75 MG EC tablet Take 1 tablet (75 mg total) by mouth 2 (two) times daily. 12/04/17   Coral Spikes, DO  DULoxetine HCl (CYMBALTA PO) Take 60 mg by mouth.     [provider]  lisinopril-hydrochlorothiazide (PRINZIDE,ZESTORETIC) 20-25 MG per tablet Take 1 tablet by mouth daily.    [provider]  traMADol (ULTRAM) 50 MG tablet Take 1  tablet (50 mg total) by mouth every 8 (eight) hours as needed. 12/04/17   Coral Spikes, DO    Family History Family History  Problem Relation Age of Onset  . Diabetes Mother   . Heart failure Mother   . Asthma Mother   . Cancer Father   . Breast cancer Cousin 78       mat cousin    Social History Social History   Tobacco Use  . Smoking status: Current Every Day Smoker    Packs/day: 0.50    Types: Cigarettes  . Smokeless tobacco: Never Used  Substance Use Topics  . Alcohol use: No    Alcohol/week: 0.0 oz  . Drug use: No     Allergies   Demerol [meperidine]; Morphine and related; Phenergan [promethazine hcl]; Stadol [butorphanol]; Zofran [ondansetron hcl]; and Imitrex [sumatriptan]   Review of Systems Review of Systems  Constitutional: Negative.   Musculoskeletal:       Left knee pain; decreased ROM.   Physical Exam Triage Vital Signs ED Triage Vitals  Enc Vitals Group     BP 12/04/17 1642 131/82     Pulse Rate 12/04/17 1642 74     Resp 12/04/17 1642 16     Temp 12/04/17 1642 98.1 F (36.7 C)  Temp Source 12/04/17 1642 Oral     SpO2 12/04/17 1642 99 %     Weight 12/04/17 1643 191 lb (86.6 kg)     Height 12/04/17 1643 5\' 6"  (1.676 m)     Head Circumference --      Peak Flow --      Pain Score 12/04/17 1643 6     Pain Loc --      Pain Edu? --      Excl. in [place]? --    Updated Vital Signs BP 131/82 (BP Location: Left Arm)   Pulse 74   Temp 98.1 F (36.7 C) (Oral)   Resp 16   Ht 5\' 6"  (1.676 m)   Wt 191 lb (86.6 kg)   SpO2 99%   BMI 30.83 kg/m     Physical Exam  Constitutional: She is oriented to person, place, and time. She appears well-developed and well-nourished. No distress.  HENT:  Head: Normocephalic and atraumatic.  Pulmonary/Chest: Effort normal. No respiratory distress.  Musculoskeletal:  Left knee: No evidence of effusion. Medial joint line tenderness. Patient with inability to fully extend the knee. Mild crepitus noted with  extension of the knee. Ligaments intact.  Neurological: She is alert and oriented to person, place, and time.  Psychiatric: She has a normal mood and affect. Her behavior is normal.  Nursing note and vitals reviewed.   UC Treatments / Results  Labs (all labs ordered are listed, but only abnormal results are displayed) Labs Reviewed - No data to display  EKG  EKG Interpretation None       Radiology Dg Knee Complete 4 Views Left  Result Date: 12/04/2017 CLINICAL DATA:  47 year old female with chronic left knee pain progressive over the past 2 weeks EXAM: LEFT KNEE - COMPLETE 4+ VIEW COMPARISON:  None. FINDINGS: No evidence of acute fracture or malalignment. No knee joint effusion. Mild tricompartmental osteoarthritis with joint space narrowing in the medial compartment. No lytic or blastic osseous lesion. Incidental note is made of a fabella. IMPRESSION: 1. Mild tricompartmental osteoarthritis most significant in the medial compartment were there is evidence of joint space narrowing. 2. No evidence of fracture or effusion. Electronically Signed   By: Jacqulynn Cadet M.D.   On: 12/04/2017 17:55    Procedures Procedures (including critical care time)  Medications Ordered in UC Medications - No data to display   Initial Impression / Assessment and Plan / UC Course  I have reviewed the triage vital signs and the nursing notes.  Pertinent labs & imaging results that were available during my care of the patient were reviewed by me and considered in my medical decision making (see chart for details).     47 year old female presents with left knee osteoarthritis.  X-ray obtained.  See results above. Treating with diclofenac and tramadol.  Advised to see Ortho.  Final Clinical Impressions(s) / UC Diagnoses   Final diagnoses:  Primary osteoarthritis of left knee    ED Discharge Orders        Ordered    diclofenac (VOLTAREN) 75 MG EC tablet  2 times daily     12/04/17 1813     traMADol (ULTRAM) 50 MG tablet  Every 8 hours PRN     12/04/17 1813     Controlled Substance Prescriptions [place] Controlled Substance Registry consulted? No   Coral Spikes, [place] 12/04/17 1820

## 2017-12-04 NOTE — Discharge Instructions (Signed)
Medications as prescribed.  Follow up with ortho.  Take care  Dr. Lacinda Axon

## 2017-12-04 NOTE — ED Triage Notes (Signed)
Pt reports she fell two years ago on the ice and injured her knee. Has been having problems since. Now with chronic left knee pain. Stands at work all day and pain is getting worse.

## 2017-12-07 ENCOUNTER — Telehealth: Payer: Self-pay | Admitting: *Deleted

## 2017-12-07 NOTE — Telephone Encounter (Signed)
Called patient, no answer, unable to leave message due to full mailbox.

## 2018-03-28 ENCOUNTER — Other Ambulatory Visit: Payer: Self-pay

## 2018-03-28 ENCOUNTER — Encounter: Payer: Self-pay | Admitting: Emergency Medicine

## 2018-03-28 DIAGNOSIS — Z5321 Procedure and treatment not carried out due to patient leaving prior to being seen by health care provider: Secondary | ICD-10-CM | POA: Diagnosis not present

## 2018-03-28 DIAGNOSIS — M545 Low back pain: Secondary | ICD-10-CM | POA: Insufficient documentation

## 2018-03-28 LAB — COMPREHENSIVE METABOLIC PANEL
ALBUMIN: 3.9 g/dL (ref 3.5–5.0)
ALK PHOS: 54 U/L (ref 38–126)
ALT: 14 U/L (ref 14–54)
AST: 15 U/L (ref 15–41)
Anion gap: 5 (ref 5–15)
BILIRUBIN TOTAL: 0.4 mg/dL (ref 0.3–1.2)
BUN: 18 mg/dL (ref 6–20)
CO2: 28 mmol/L (ref 22–32)
CREATININE: 0.84 mg/dL (ref 0.44–1.00)
Calcium: 8.8 mg/dL — ABNORMAL LOW (ref 8.9–10.3)
Chloride: 99 mmol/L — ABNORMAL LOW (ref 101–111)
GFR calc Af Amer: >60 mL/min (ref >60)
GFR calc non Af Amer: >60 mL/min (ref >60)
GLUCOSE: 83 mg/dL (ref 65–99)
Potassium: 3.6 mmol/L (ref 3.5–5.1)
Sodium: 132 mmol/L — ABNORMAL LOW (ref 135–145)
TOTAL PROTEIN: 7.6 g/dL (ref 6.5–8.1)

## 2018-03-28 LAB — CBC
HCT: 35.4 % (ref 35.0–47.0)
Hemoglobin: 12.3 g/dL (ref 12.0–16.0)
MCH: 34.2 pg — AB (ref 26.0–34.0)
MCHC: 34.6 g/dL (ref 32.0–36.0)
MCV: 98.7 fL (ref 80.0–100.0)
PLATELETS: 273 10*3/uL (ref 150–440)
RBC: 3.58 MIL/uL — ABNORMAL LOW (ref 3.80–5.20)
RDW: 14.2 % (ref 11.5–14.5)
WBC: 14.5 10*3/uL — ABNORMAL HIGH (ref 3.6–11.0)

## 2018-03-28 LAB — LIPASE, BLOOD: Lipase: 29 U/L (ref 11–51)

## 2018-03-28 LAB — POCT PREGNANCY, URINE: PREG TEST UR: NEGATIVE

## 2018-03-28 NOTE — ED Triage Notes (Signed)
Patient ambulatory to triage with steady gait, without difficulty or distress noted; pt reports left lower back pain radiating into left lower abd since last night; st onset while at work, bending over accomp by nausea and swelling to abd

## 2018-03-29 ENCOUNTER — Emergency Department
Admission: EM | Admit: 2018-03-29 | Discharge: 2018-03-29 | Disposition: A | Discharge status: Home / Self Care | Payer: 59 | Attending: Emergency Medicine | Admitting: Emergency Medicine

## 2018-03-29 LAB — URINALYSIS, COMPLETE (UACMP) WITH MICROSCOPIC
Bacteria, UA: NONE SEEN
Bilirubin Urine: NEGATIVE
GLUCOSE, UA: NEGATIVE mg/dL
Hgb urine dipstick: NEGATIVE
Ketones, ur: NEGATIVE mg/dL
Leukocytes, UA: NEGATIVE
Nitrite: NEGATIVE
PH: 6 (ref 5.0–8.0)
Protein, ur: NEGATIVE mg/dL
SPECIFIC GRAVITY, URINE: 1.025 (ref 1.005–1.030)

## 2018-04-13 ENCOUNTER — Ambulatory Visit
Admission: EM | Admit: 2018-04-13 | Discharge: 2018-04-13 | Disposition: A | Discharge status: Home / Self Care | Payer: 59 | Attending: Family Medicine | Admitting: Family Medicine

## 2018-04-13 ENCOUNTER — Other Ambulatory Visit: Payer: Self-pay

## 2018-04-13 DIAGNOSIS — R300 Dysuria: Secondary | ICD-10-CM | POA: Diagnosis not present

## 2018-04-13 LAB — URINALYSIS, COMPLETE (UACMP) WITH MICROSCOPIC
RBC / HPF: >50 RBC/hpf (ref 0–5)
WBC, UA: >50 WBC/hpf (ref 0–5)

## 2018-04-13 MED ORDER — CEPHALEXIN 500 MG PO CAPS: 500.0000 mg | ORAL_CAPSULE | Freq: Four times a day (QID) | ORAL | 0 refills | Status: DC

## 2018-04-13 MED ORDER — FLUCONAZOLE 150 MG PO TABS: 150.0000 mg | ORAL_TABLET | Freq: Every day | ORAL | 0 refills | Status: DC

## 2018-04-13 NOTE — ED Triage Notes (Signed)
Pt with dysuria, right flank pain x 3-4 days

## 2018-04-13 NOTE — ED Provider Notes (Signed)
[facility]    CSN: [id number] Arrival date & time: 04/13/18  1740     History   Chief Complaint Chief Complaint  Patient presents with  . Dysuria    HPI Amy Logan is a 47 y.o. female.   The history is provided by the patient.  Dysuria  Pain quality:  Burning Pain severity:  Mild Onset quality:  Sudden Duration:  3 days Timing:  Constant Progression:  Worsening Chronicity:  New Recent urinary tract infections: no   Relieved by:  Phenazopyridine Urinary symptoms: discolored urine and frequent urination   Associated symptoms: flank pain   Risk factors: no hx of pyelonephritis, no hx of urolithiasis, no kidney transplant, not pregnant, no recurrent urinary tract infections, no renal cysts, no renal disease, no single kidney and no urinary catheter     Past Medical History:  Diagnosis Date  . Anxiety   . Bradycardia   . Cancer ([place]) 1990   cervical cells stage 4  . Fibromyalgia   . Hypertension   . Neuropathy     There are no active problems to display for this patient.   Past Surgical History:  Procedure Laterality Date  . ABDOMINAL HYSTERECTOMY    . APPENDECTOMY    . BREAST CYST ASPIRATION Left    neg  . CHOLECYSTECTOMY    . JOINT REPLACEMENT    . REPLACEMENT TOTAL KNEE     Right knee    OB History   None      Home Medications    Prior to Admission medications   Medication Sig Start Date End Date Taking? Authorizing Provider  naproxen (EC NAPROSYN) 500 MG EC tablet Take by mouth. 12/15/16  Yes [provider]  albuterol (PROVENTIL HFA;VENTOLIN HFA) 108 (90 Base) MCG/ACT inhaler Inhale 1-2 puffs into the lungs every 6 (six) hours as needed for wheezing or shortness of breath. 08/18/16   Norval Gable, MD  cephALEXin (KEFLEX) 500 MG capsule Take 1 capsule (500 mg total) by mouth 4 (four) times daily. 04/13/18   Norval Gable, MD  cyclobenzaprine (FLEXERIL) 10 MG tablet Take 1 tablet (10 mg total) by mouth at bedtime. 08/15/17    Norval Gable, MD  diclofenac (VOLTAREN) 75 MG EC tablet Take 1 tablet (75 mg total) by mouth 2 (two) times daily. 12/04/17   Coral Spikes, DO  DULoxetine HCl (CYMBALTA PO) Take 60 mg by mouth.     [provider]  esomeprazole (NEXIUM) 20 MG capsule Take by mouth.    [provider]  fluconazole (DIFLUCAN) 150 MG tablet Take 1 tablet (150 mg total) by mouth daily. 04/13/18   Norval Gable, MD  lisinopril-hydrochlorothiazide (PRINZIDE,ZESTORETIC) 20-25 MG per tablet Take 1 tablet by mouth daily.    [provider]  traMADol (ULTRAM) 50 MG tablet Take 1 tablet (50 mg total) by mouth every 8 (eight) hours as needed. 12/04/17   Coral Spikes, DO    Family History Family History  Problem Relation Age of Onset  . Diabetes Mother   . Heart failure Mother   . Asthma Mother   . Cancer Father   . Breast cancer Cousin 7       mat cousin    Social History Social History   Tobacco Use  . Smoking status: Current Every Day Smoker    Packs/day: 0.50    Types: Cigarettes  . Smokeless tobacco: Never Used  Substance Use Topics  . Alcohol use: Yes    Alcohol/week: 0.0 oz  Comment: social  . Drug use: No     Allergies   Demerol [meperidine]; Morphine and related; Phenergan [promethazine hcl]; Stadol [butorphanol]; Zofran [ondansetron hcl]; and Imitrex [sumatriptan]   Review of Systems Review of Systems  Genitourinary: Positive for dysuria and flank pain.     Physical Exam Triage Vital Signs ED Triage Vitals  Enc Vitals Group     BP 04/13/18 0834 128/79     Pulse Rate 04/13/18 0834 61     Resp 04/13/18 0834 18     Temp 04/13/18 0834 98.3 F (36.8 C)     Temp Source 04/13/18 0834 Oral     SpO2 04/13/18 0834 97 %     Weight 04/13/18 0835 191 lb (86.6 kg)     Height 04/13/18 0835 5\' 6"  (1.676 m)     Head Circumference --      Peak Flow --      Pain Score 04/13/18 0835 5     Pain Loc --      Pain Edu? --      Excl. in [place]? --    No data  found.  Updated Vital Signs BP 128/79 (BP Location: Left Arm)   Pulse 61   Temp 98.3 F (36.8 C) (Oral)   Resp 18   Ht 5\' 6"  (1.676 m)   Wt 191 lb (86.6 kg)   SpO2 97%   BMI 30.83 kg/m   Visual Acuity Right Eye Distance:   Left Eye Distance:   Bilateral Distance:    Right Eye Near:   Left Eye Near:    Bilateral Near:     Physical Exam  Constitutional: She appears well-developed and well-nourished. No distress.  Abdominal: She exhibits no distension.  Skin: She is not diaphoretic.  Nursing note and vitals reviewed.    UC Treatments / Results  Labs (all labs ordered are listed, but only abnormal results are displayed) Labs Reviewed  URINALYSIS, COMPLETE (UACMP) WITH MICROSCOPIC - Abnormal; Notable for the following components:      Result Value   Color, Urine ORANGE (*)    APPearance CLOUDY (*)    Glucose, UA   (*)    Value: TEST NOT REPORTED DUE TO COLOR INTERFERENCE OF URINE PIGMENT   Hgb urine dipstick   (*)    Value: TEST NOT REPORTED DUE TO COLOR INTERFERENCE OF URINE PIGMENT   Bilirubin Urine   (*)    Value: TEST NOT REPORTED DUE TO COLOR INTERFERENCE OF URINE PIGMENT   Ketones, ur   (*)    Value: TEST NOT REPORTED DUE TO COLOR INTERFERENCE OF URINE PIGMENT   Protein, ur   (*)    Value: TEST NOT REPORTED DUE TO COLOR INTERFERENCE OF URINE PIGMENT   Nitrite   (*)    Value: TEST NOT REPORTED DUE TO COLOR INTERFERENCE OF URINE PIGMENT   Leukocytes, UA   (*)    Value: TEST NOT REPORTED DUE TO COLOR INTERFERENCE OF URINE PIGMENT   Bacteria, UA RARE (*)    All other components within normal limits  URINE CULTURE    EKG None  Radiology No results found.  Procedures Procedures (including critical care time)  Medications Ordered in UC Medications - No data to display  Initial Impression / Assessment and Plan / UC Course  I have reviewed the triage vital signs and the nursing notes.  Pertinent labs & imaging results that were available during my care  of the patient were reviewed by me and considered  in my medical decision making (see chart for details).      Final Clinical Impressions(s) / UC Diagnoses   Final diagnoses:  Dysuria   Discharge Instructions   None    ED Prescriptions    Medication Sig Dispense Auth. Provider   cephALEXin (KEFLEX) 500 MG capsule Take 1 capsule (500 mg total) by mouth 4 (four) times daily. 10 capsule Norval Gable, MD   fluconazole (DIFLUCAN) 150 MG tablet Take 1 tablet (150 mg total) by mouth daily. 1 tablet Norval Gable, MD     1. Lab results and diagnosis reviewed with patient 2. rx as per orders above; reviewed possible side effects, interactions, risks and benefits  3. Recommend supportive treatment with increased water intake 4. Follow-up prn if symptoms worsen or don't improve   Controlled Substance Prescriptions [place] Controlled Substance Registry consulted? Not Applicable   Norval Gable, MD 04/13/18 [phone]

## 2018-04-15 LAB — URINE CULTURE: SPECIAL REQUESTS: NORMAL

## 2018-09-30 ENCOUNTER — Encounter: Payer: Self-pay | Admitting: *Deleted

## 2018-09-30 ENCOUNTER — Ambulatory Visit: Payer: Self-pay

## 2018-09-30 ENCOUNTER — Other Ambulatory Visit: Payer: Self-pay

## 2018-09-30 ENCOUNTER — Encounter: Payer: Self-pay | Admitting: General Surgery

## 2018-09-30 ENCOUNTER — Ambulatory Visit (INDEPENDENT_AMBULATORY_CARE_PROVIDER_SITE_OTHER): Payer: 59 | Admitting: General Surgery

## 2018-09-30 VITALS — BP 183/114 | Temp 97.3°F | Ht 66.0 in | Wt 212.0 lb

## 2018-09-30 DIAGNOSIS — D171 Benign lipomatous neoplasm of skin and subcutaneous tissue of trunk: Secondary | ICD-10-CM | POA: Diagnosis not present

## 2018-09-30 DIAGNOSIS — R1900 Intra-abdominal and pelvic swelling, mass and lump, unspecified site: Secondary | ICD-10-CM | POA: Insufficient documentation

## 2018-09-30 NOTE — Patient Instructions (Addendum)
Return for excision lipoma at [facility]

## 2018-09-30 NOTE — Progress Notes (Signed)
Patient's surgery has been scheduled for 10-09-18 at [facility] with Dr. Celine Ahr.  The patient is aware that she will be contacted for a phone interview by the Pre-admission Testing Department.   The patient is aware to call the office should they have further questions.

## 2018-09-30 NOTE — H&P (View-Only) (Signed)
Patient ID: Amy Logan, female   DOB: 11/04/1971, 47 y.o.   MRN: [id number]  Chief Complaint  Patient presents with  . Lipoma    HPI Amy Logan is a 48 y.o. female.  She has been referred for surgical evaluation of an abdominal wall soft tissue mass.  [name] states that the mass has been present for 20 years, but has recently been growing, more so over the past 2 weeks.  It has also become quite tender with any physical contact or trauma over the past month.  The mass has developed some discoloration with dilated capillaries. She was seen by a dermatologist who did not feel comfortable addressing it in their clinic. The patient states she was told the lesion was "full of blood."  She did have a CT scan of the abdomen. The images are not available for my review, however the report states that there is a hypodense lesion in the abdominal wall suggestive of an epidermal inclusion cyst. No comment was made about vascular flow or involvement.  She is also concerned today that her blood pressure is high. She states that her doctor discontinued her blood pressure medication recently and since then she has been experiencing headaches.   Past Medical History:  Diagnosis Date  . Anxiety   . Bradycardia   . Cancer ([place]) 1990   cervical cells stage 4  . Fibromyalgia   . Hypertension   . Neuropathy     Past Surgical History:  Procedure Laterality Date  . ABDOMINAL HYSTERECTOMY    . APPENDECTOMY    . BREAST CYST ASPIRATION Left    neg  . CHOLECYSTECTOMY    . COLONOSCOPY  2018  . cyst removed     thyroglossal duct cyst  . JOINT REPLACEMENT    . REPLACEMENT TOTAL KNEE     Right knee    Family History  Problem Relation Age of Onset  . Diabetes Mother   . Heart failure Mother   . Asthma Mother   . Cancer Father   . Breast cancer Cousin 67       mat cousin    Social History Social History   Tobacco Use  . Smoking status: Current Every Day Smoker    Packs/day: 0.50    Types:  Cigarettes  . Smokeless tobacco: Never Used  Substance Use Topics  . Alcohol use: Yes    Alcohol/week: 0.0 standard drinks    Comment: social  . Drug use: No    Allergies  Allergen Reactions  . Demerol [Meperidine]   . Morphine And Related   . Phenergan [Promethazine Hcl]   . Stadol [Butorphanol]   . Zofran [Ondansetron Hcl] Hives  . Imitrex [Sumatriptan] Palpitations    Current Outpatient Medications  Medication Sig Dispense Refill  . albuterol (PROVENTIL HFA;VENTOLIN HFA) 108 (90 Base) MCG/ACT inhaler Inhale 1-2 puffs into the lungs every 6 (six) hours as needed for wheezing or shortness of breath. 1 Inhaler 0  . cyanocobalamin (,VITAMIN B-12,) 1000 MCG/ML injection Inject into the muscle.    . DULoxetine HCl (CYMBALTA PO) Take 60 mg by mouth.     . traMADol (ULTRAM) 50 MG tablet Take 1 tablet (50 mg total) by mouth every 8 (eight) hours as needed. 15 tablet 0  . Vitamin D, Ergocalciferol, (DRISDOL) 1.25 MG (50000 UT) CAPS capsule Take 50,000 Units by mouth once a week.  0  . esomeprazole (NEXIUM) 20 MG capsule Take by mouth.     No current facility-administered medications  for this visit.     Review of Systems Review of Systems  Blood pressure (!) 183/114, temperature (!) 97.3 F (36.3 C), temperature source Skin, height 5\' 6"  (1.676 m), weight 212 lb (96.2 kg), SpO2 98 %.  Physical Exam Physical Exam  Constitutional: She is oriented to person, place, and time. She appears well-developed and well-nourished. No distress.  HENT:  Head: Normocephalic and atraumatic.  Mouth/Throat: No oropharyngeal exudate.  Eyes: Pupils are equal, round, and reactive to light. No scleral icterus.  Neck: Normal range of motion. Neck supple. No thyromegaly present.  Small, well-healed and faded scar at the level of the hyoid bone.  Cardiovascular: Normal rate, regular rhythm, normal heart sounds and intact distal pulses.  Pulmonary/Chest: Effort normal and breath sounds normal.   Abdominal: Soft. Bowel sounds are normal. She exhibits mass. There is tenderness. No hernia.  The mass is located caudal to the costal margin at about the anterior axillary line.  It is mobile, soft, and well-circumscribed.  It feels to be completely within the skin layer, without involvement of the underlying tissues.  There are multiple broken and dilated capillaries on the surface.  No fluctuance or bruit within the nodule.  Musculoskeletal: She exhibits no edema, tenderness or deformity.  Lymphadenopathy:    She has no cervical adenopathy.  Neurological: She is alert and oriented to person, place, and time.  Skin: Skin is warm and dry.  Psychiatric: She has a normal mood and affect.    Data Reviewed  CT abdomen and pelvis with IV contrast  Comparison: None.  Indication: Abdominal pain, acute, nonlocalized, Abdominal Pain, R10.0 Acute abdomen.  Technique: CT imaging was performed of the abdomen and pelvis following the uncomplicated administration of intravenous contrast (Isovue-300, 150 mL). Iodinated contrast was used due to the indications for the examination, to improve disease detection and further define anatomy. The most recent serum creatinine is 0.8 mg/dL.  Coronal and sagittal reformatted images were generated and reviewed.  Findings:  The liver is prominent in size inferior right hepatic lobe beyond the lower pole the right kidney. The spleen and pancreas are unremarkable. The gallbladder is surgically absent. There is mild intrahepatic biliary dilatation, likely secondary to prior cholecystectomy.  There is a small hiatal hernia. There is mild gastric and proximal duodenal wall thickening without surrounding inflammatory stranding, likely secondary to underdistention. The small bowel is unremarkable. Mild sigmoid and descending colonic diverticulosis is noted without evidence of acute inflammation. Focal fatty infiltration is seen about the ileocecal  valve, which could be from chronic inflammation versus postsurgical from prior appendectomy. No free fluid or free air is seen within the abdomen or pelvis.  The kidneys, adrenal glands, and ureters are unremarkable. No urolithiasis or hydronephrosis is seen. Urinary bladder is unremarkable. Uterus is surgically absent. No adnexal masses are seen.  There is a 3.9 x 6.3 cm hypodense lesion in the right ventral abdominal wall, likely epidermal inclusion cyst/dermoid cyst. The level degenerative changes of the visualized spine are seen without suspicious osseous lesion.  The lung bases are unremarkable.   Impression: 1. No acute abnormality within the abdomen or pelvis evident. 2. Colonic diverticulosis without evidence of acute diverticulitis. 3. Small hiatal hernia.  Electronically Reviewed by: Vidal Schwalbe, MD, [facility] Electronically Reviewed on: 06/17/2018 8:33 AM  I have reviewed the images and concur with the above findings.  Electronically Signed by: Domingo Mend, MD, [facility] Electronically Signed on: 06/17/2018 11:18 AM  Ultrasound performed in clinic today.  See  separate documentation, but briefly, the mass is solid and appears to be soft tissue density.  There is no vascular flow nor any fluid-density content.  Assessment    47 y/o F with an abdominal wall soft tissue mass.  It seems most consistent with either a lipoma or epidermal inclusion cyst.  Ms. Laforest desires surgical removal.    Plan    Will schedule for excision of the mass in the operating room. I expect this will be an outpatient procedure. The risks of the operation were discussed, including but not limited to: bleeding, infection, pain, scarring, recurrence, anesthetic complications.  Her questions were answered and she wishes to proceed. Due to history of chronic pain, will make anesthesia referral as well.       Fredirick Maudlin 09/30/2018, 11:11 AM

## 2018-09-30 NOTE — Progress Notes (Signed)
Patient ID: Amy Logan, female   DOB: 07-Feb-1971, 47 y.o.   MRN: [id number]  Chief Complaint  Patient presents with  . Lipoma    HPI Amy Logan is a 47 y.o. female.  She has been referred for surgical evaluation of an abdominal wall soft tissue mass.  Amy Logan states that the mass has been present for 20 years, but has recently been growing, more so over the past 2 weeks.  It has also become quite tender with any physical contact or trauma over the past month.  The mass has developed some discoloration with dilated capillaries. She was seen by a dermatologist who did not feel comfortable addressing it in their clinic. The patient states she was told the lesion was "full of blood."  She did have a CT scan of the abdomen. The images are not available for my review, however the report states that there is a hypodense lesion in the abdominal wall suggestive of an epidermal inclusion cyst. No comment was made about vascular flow or involvement.  She is also concerned today that her blood pressure is high. She states that her doctor discontinued her blood pressure medication recently and since then she has been experiencing headaches.   Past Medical History:  Diagnosis Date  . Anxiety   . Bradycardia   . Cancer ([place]) 1990   cervical cells stage 4  . Fibromyalgia   . Hypertension   . Neuropathy     Past Surgical History:  Procedure Laterality Date  . ABDOMINAL HYSTERECTOMY    . APPENDECTOMY    . BREAST CYST ASPIRATION Left    neg  . CHOLECYSTECTOMY    . COLONOSCOPY  2018  . cyst removed     thyroglossal duct cyst  . JOINT REPLACEMENT    . REPLACEMENT TOTAL KNEE     Right knee    Family History  Problem Relation Age of Onset  . Diabetes Mother   . Heart failure Mother   . Asthma Mother   . Cancer Father   . Breast cancer Cousin 35       mat cousin    Social History Social History   Tobacco Use  . Smoking status: Current Every Day Smoker    Packs/day: 0.50    Types:  Cigarettes  . Smokeless tobacco: Never Used  Substance Use Topics  . Alcohol use: Yes    Alcohol/week: 0.0 standard drinks    Comment: social  . Drug use: No    Allergies  Allergen Reactions  . Demerol [Meperidine]   . Morphine And Related   . Phenergan [Promethazine Hcl]   . Stadol [Butorphanol]   . Zofran [Ondansetron Hcl] Hives  . Imitrex [Sumatriptan] Palpitations    Current Outpatient Medications  Medication Sig Dispense Refill  . albuterol (PROVENTIL HFA;VENTOLIN HFA) 108 (90 Base) MCG/ACT inhaler Inhale 1-2 puffs into the lungs every 6 (six) hours as needed for wheezing or shortness of breath. 1 Inhaler 0  . cyanocobalamin (,VITAMIN B-12,) 1000 MCG/ML injection Inject into the muscle.    . DULoxetine HCl (CYMBALTA PO) Take 60 mg by mouth.     . traMADol (ULTRAM) 50 MG tablet Take 1 tablet (50 mg total) by mouth every 8 (eight) hours as needed. 15 tablet 0  . Vitamin D, Ergocalciferol, (DRISDOL) 1.25 MG (50000 UT) CAPS capsule Take 50,000 Units by mouth once a week.  0  . esomeprazole (NEXIUM) 20 MG capsule Take by mouth.     No current facility-administered medications  for this visit.     Review of Systems Review of Systems  Blood pressure (!) 183/114, temperature (!) 97.3 F (36.3 C), temperature source Skin, height 5\' 6"  (1.676 m), weight 212 lb (96.2 kg), SpO2 98 %.  Physical Exam Physical Exam  Constitutional: She is oriented to person, place, and time. She appears well-developed and well-nourished. No distress.  HENT:  Head: Normocephalic and atraumatic.  Mouth/Throat: No oropharyngeal exudate.  Eyes: Pupils are equal, round, and reactive to light. No scleral icterus.  Neck: Normal range of motion. Neck supple. No thyromegaly present.  Small, well-healed and faded scar at the level of the hyoid bone.  Cardiovascular: Normal rate, regular rhythm, normal heart sounds and intact distal pulses.  Pulmonary/Chest: Effort normal and breath sounds normal.   Abdominal: Soft. Bowel sounds are normal. She exhibits mass. There is tenderness. No hernia.  The mass is located caudal to the costal margin at about the anterior axillary line.  It is mobile, soft, and well-circumscribed.  It feels to be completely within the skin layer, without involvement of the underlying tissues.  There are multiple broken and dilated capillaries on the surface.  No fluctuance or bruit within the nodule.  Musculoskeletal: She exhibits no edema, tenderness or deformity.  Lymphadenopathy:    She has no cervical adenopathy.  Neurological: She is alert and oriented to person, place, and time.  Skin: Skin is warm and dry.  Psychiatric: She has a normal mood and affect.    Data Reviewed  CT abdomen and pelvis with IV contrast  Comparison: None.  Indication: Abdominal pain, acute, nonlocalized, Abdominal Pain, R10.0 Acute abdomen.  Technique: CT imaging was performed of the abdomen and pelvis following the uncomplicated administration of intravenous contrast (Isovue-300, 150 mL). Iodinated contrast was used due to the indications for the examination, to improve disease detection and further define anatomy. The most recent serum creatinine is 0.8 mg/dL.  Coronal and sagittal reformatted images were generated and reviewed.  Findings:  The liver is prominent in size inferior right hepatic lobe beyond the lower pole the right kidney. The spleen and pancreas are unremarkable. The gallbladder is surgically absent. There is mild intrahepatic biliary dilatation, likely secondary to prior cholecystectomy.  There is a small hiatal hernia. There is mild gastric and proximal duodenal wall thickening without surrounding inflammatory stranding, likely secondary to underdistention. The small bowel is unremarkable. Mild sigmoid and descending colonic diverticulosis is noted without evidence of acute inflammation. Focal fatty infiltration is seen about the ileocecal  valve, which could be from chronic inflammation versus postsurgical from prior appendectomy. No free fluid or free air is seen within the abdomen or pelvis.  The kidneys, adrenal glands, and ureters are unremarkable. No urolithiasis or hydronephrosis is seen. Urinary bladder is unremarkable. Uterus is surgically absent. No adnexal masses are seen.  There is a 3.9 x 6.3 cm hypodense lesion in the right ventral abdominal wall, likely epidermal inclusion cyst/dermoid cyst. The level degenerative changes of the visualized spine are seen without suspicious osseous lesion.  The lung bases are unremarkable.   Impression: 1. No acute abnormality within the abdomen or pelvis evident. 2. Colonic diverticulosis without evidence of acute diverticulitis. 3. Small hiatal hernia.  Electronically Reviewed by: Vidal Schwalbe, MD, [facility] Electronically Reviewed on: 06/17/2018 8:33 AM  I have reviewed the images and concur with the above findings.  Electronically Signed by: Domingo Mend, MD, [facility] Electronically Signed on: 06/17/2018 11:18 AM  Ultrasound performed in clinic today.  See  separate documentation, but briefly, the mass is solid and appears to be soft tissue density.  There is no vascular flow nor any fluid-density content.  Assessment    47 y/o F with an abdominal wall soft tissue mass.  It seems most consistent with either a lipoma or epidermal inclusion cyst.  Ms. Ballentine desires surgical removal.    Plan    Will schedule for excision of the mass in the operating room. I expect this will be an outpatient procedure. The risks of the operation were discussed, including but not limited to: bleeding, infection, pain, scarring, recurrence, anesthetic complications.  Her questions were answered and she wishes to proceed. Due to history of chronic pain, will make anesthesia referral as well.       Fredirick Maudlin 09/30/2018, 11:11 AM

## 2018-10-04 ENCOUNTER — Other Ambulatory Visit: Payer: Self-pay

## 2018-10-04 ENCOUNTER — Encounter
Admission: RE | Admit: 2018-10-04 | Discharge: 2018-10-04 | Disposition: A | Discharge status: Home / Self Care | Payer: 59 | Source: Ambulatory Visit | Attending: General Surgery | Admitting: General Surgery

## 2018-10-04 HISTORY — DX: Anemia, unspecified: D64.9

## 2018-10-04 HISTORY — DX: Personal history of other diseases of the digestive system: Z87.19

## 2018-10-04 HISTORY — DX: Unspecified osteoarthritis, unspecified site: M19.90

## 2018-10-04 HISTORY — DX: Personal history of urinary calculi: Z87.442

## 2018-10-04 NOTE — Patient Instructions (Addendum)
Your procedure is scheduled on: 10-09-18 [facility] Report to Same Day Surgery 2nd floor medical mall [facility] Entrance-take elevator on left to 2nd floor.  Check in with surgery information desk.) To find out your arrival time please call [phone] between 1PM - 3PM on 10-08-18 TUESDAY  Remember: Instructions that are not followed completely may result in serious medical risk, up to and including death, or upon the discretion of your surgeon and anesthesiologist your surgery may need to be rescheduled.    _x___ 1. Do not eat food after midnight the night before your procedure. NO GUM OR CANDY AFTER MIDNIGHT.  You may drink clear liquids up to 2 hours before you are scheduled to arrive at the hospital for your procedure.  Do not drink clear liquids within 2 hours of your scheduled arrival to the hospital.  Clear liquids include  --Water or Apple juice without pulp  --Clear carbohydrate beverage such as ClearFast or Gatorade  --Black Coffee or Clear Tea (No milk, no creamers, do not add anything to the coffee or Tea   ____Ensure clear carbohydrate drink on the way to the hospital for bariatric patients  ____Ensure clear carbohydrate drink 3 hours before surgery for Dr Dwyane Luo patients if physician instructed.    __x__ 2. No Alcohol for 24 hours before or after surgery.   __x__3. No Smoking or e-cigarettes for 24 prior to surgery.  Do not use any chewable tobacco products for at least 6 hour prior to surgery   ____  4. Bring all medications with you on the day of surgery if instructed.    __x__ 5. Notify your doctor if there is any change in your medical condition     (cold, fever, infections).    x___6. On the morning of surgery brush your teeth with toothpaste and water.  You may rinse your mouth with mouth wash if you wish.  Do not swallow any toothpaste or mouthwash.   Do not wear jewelry, make-up, hairpins, clips or nail polish.  Do not wear lotions, powders, or  perfumes. You may wear deodorant.  Do not shave 48 hours prior to surgery. Men may shave face and neck.  Do not bring valuables to the hospital.    [facility] is not responsible for any belongings or valuables.               Contacts, dentures or bridgework may not be worn into surgery.  Leave your suitcase in the car. After surgery it may be brought to your room.  For patients admitted to the hospital, discharge time is determined by your treatment team.  _  Patients discharged the day of surgery will not be allowed to drive home.  You will need someone to drive you home and stay with you the night of your procedure.    Please read over the following fact sheets that you were given:   [facility] Preparing for Surgery  ____ Take anti-hypertensive listed below, cardiac, seizure, asthma, anti-reflux and psychiatric medicines. These include:  1. YOU MAY TAKE TRAMADOL DAY OF SURGERY IF NEEDED WITH A SMALL SIP OF WATER  2.  3.  4.  5.  6.  ____Fleets enema or Magnesium Citrate as directed.   _x___ Use CHG Soap or sage wipes as directed on instruction sheet   ____ Use inhalers on the day of surgery and bring to hospital day of surgery  ____ Stop Metformin and Janumet 2 days prior to surgery.    ____  Take 1/2 of usual insulin dose the night before surgery and none on the morning surgery.   ____ Follow recommendations from Cardiologist, Pulmonologist or PCP regarding stopping Aspirin, Coumadin, Plavix ,Eliquis, Effient, or Pradaxa, and Pletal.  X____Stop Anti-inflammatories such as Advil, Aleve, Ibuprofen, Motrin, Naproxen, Naprosyn, Goodies powders or aspirin products NOW-OK to take Tylenol OR TRAMADOL IF NEEDED   _x___ Stop supplements until after surgery.  But may continue Vitamin D, Vitamin B,       and multivitamin.   ____ Bring C-Pap to the hospital.

## 2018-10-07 ENCOUNTER — Encounter
Admission: RE | Admit: 2018-10-07 | Discharge: 2018-10-07 | Disposition: A | Discharge status: Home / Self Care | Payer: 59 | Source: Ambulatory Visit | Attending: General Surgery | Admitting: General Surgery

## 2018-10-07 DIAGNOSIS — Z87442 Personal history of urinary calculi: Secondary | ICD-10-CM | POA: Diagnosis not present

## 2018-10-07 DIAGNOSIS — F419 Anxiety disorder, unspecified: Secondary | ICD-10-CM | POA: Diagnosis not present

## 2018-10-07 DIAGNOSIS — F17210 Nicotine dependence, cigarettes, uncomplicated: Secondary | ICD-10-CM | POA: Diagnosis not present

## 2018-10-07 DIAGNOSIS — R001 Bradycardia, unspecified: Secondary | ICD-10-CM | POA: Diagnosis not present

## 2018-10-07 DIAGNOSIS — D649 Anemia, unspecified: Secondary | ICD-10-CM | POA: Diagnosis not present

## 2018-10-07 DIAGNOSIS — R100 Acute abdomen: Secondary | ICD-10-CM | POA: Diagnosis not present

## 2018-10-07 DIAGNOSIS — G629 Polyneuropathy, unspecified: Secondary | ICD-10-CM | POA: Diagnosis not present

## 2018-10-07 DIAGNOSIS — Z01812 Encounter for preprocedural laboratory examination: Secondary | ICD-10-CM

## 2018-10-07 DIAGNOSIS — G8929 Other chronic pain: Secondary | ICD-10-CM | POA: Diagnosis not present

## 2018-10-07 DIAGNOSIS — K5730 Diverticulosis of large intestine without perforation or abscess without bleeding: Secondary | ICD-10-CM | POA: Diagnosis not present

## 2018-10-07 DIAGNOSIS — Z9049 Acquired absence of other specified parts of digestive tract: Secondary | ICD-10-CM | POA: Diagnosis not present

## 2018-10-07 DIAGNOSIS — Z885 Allergy status to narcotic agent status: Secondary | ICD-10-CM | POA: Diagnosis not present

## 2018-10-07 DIAGNOSIS — Z833 Family history of diabetes mellitus: Secondary | ICD-10-CM | POA: Diagnosis not present

## 2018-10-07 DIAGNOSIS — Z96651 Presence of right artificial knee joint: Secondary | ICD-10-CM | POA: Diagnosis not present

## 2018-10-07 DIAGNOSIS — M1990 Unspecified osteoarthritis, unspecified site: Secondary | ICD-10-CM | POA: Diagnosis not present

## 2018-10-07 DIAGNOSIS — I10 Essential (primary) hypertension: Secondary | ICD-10-CM | POA: Diagnosis not present

## 2018-10-07 DIAGNOSIS — R51 Headache: Secondary | ICD-10-CM | POA: Diagnosis not present

## 2018-10-07 DIAGNOSIS — M797 Fibromyalgia: Secondary | ICD-10-CM | POA: Diagnosis not present

## 2018-10-07 DIAGNOSIS — R229 Localized swelling, mass and lump, unspecified: Secondary | ICD-10-CM | POA: Diagnosis present

## 2018-10-07 DIAGNOSIS — L723 Sebaceous cyst: Secondary | ICD-10-CM | POA: Diagnosis not present

## 2018-10-07 DIAGNOSIS — Z79899 Other long term (current) drug therapy: Secondary | ICD-10-CM | POA: Diagnosis not present

## 2018-10-07 DIAGNOSIS — Z8541 Personal history of malignant neoplasm of cervix uteri: Secondary | ICD-10-CM | POA: Diagnosis not present

## 2018-10-07 DIAGNOSIS — G709 Myoneural disorder, unspecified: Secondary | ICD-10-CM | POA: Diagnosis not present

## 2018-10-07 DIAGNOSIS — K449 Diaphragmatic hernia without obstruction or gangrene: Secondary | ICD-10-CM | POA: Diagnosis not present

## 2018-10-07 DIAGNOSIS — Z8249 Family history of ischemic heart disease and other diseases of the circulatory system: Secondary | ICD-10-CM | POA: Diagnosis not present

## 2018-10-07 DIAGNOSIS — Z90710 Acquired absence of both cervix and uterus: Secondary | ICD-10-CM | POA: Diagnosis not present

## 2018-10-07 LAB — POTASSIUM: Potassium: 3.9 mmol/L (ref 3.5–5.1)

## 2018-10-08 MED ORDER — CEFAZOLIN SODIUM-DEXTROSE 2-4 GM/100ML-% IV SOLN
2.0000 g | INTRAVENOUS | Status: AC
  Administered 2018-10-09: 2 g via INTRAVENOUS
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
## 2018-10-09 ENCOUNTER — Encounter: Payer: Self-pay | Admitting: *Deleted

## 2018-10-09 ENCOUNTER — Ambulatory Visit: Payer: 59 | Admitting: Certified Registered"

## 2018-10-09 ENCOUNTER — Ambulatory Visit
Admission: RE | Admit: 2018-10-09 | Discharge: 2018-10-09 | Disposition: A | Discharge status: Home / Self Care | Payer: 59 | Source: Ambulatory Visit | Attending: General Surgery | Admitting: General Surgery

## 2018-10-09 ENCOUNTER — Encounter
Admission: RE | Disposition: A | Discharge status: Home / Self Care | Payer: Self-pay | Source: Ambulatory Visit | Attending: General Surgery

## 2018-10-09 ENCOUNTER — Other Ambulatory Visit: Payer: Self-pay

## 2018-10-09 DIAGNOSIS — Z90710 Acquired absence of both cervix and uterus: Secondary | ICD-10-CM | POA: Insufficient documentation

## 2018-10-09 DIAGNOSIS — I10 Essential (primary) hypertension: Secondary | ICD-10-CM | POA: Insufficient documentation

## 2018-10-09 DIAGNOSIS — M797 Fibromyalgia: Secondary | ICD-10-CM | POA: Insufficient documentation

## 2018-10-09 DIAGNOSIS — Z809 Family history of malignant neoplasm, unspecified: Secondary | ICD-10-CM | POA: Insufficient documentation

## 2018-10-09 DIAGNOSIS — L720 Epidermal cyst: Secondary | ICD-10-CM | POA: Diagnosis present

## 2018-10-09 DIAGNOSIS — D649 Anemia, unspecified: Secondary | ICD-10-CM | POA: Insufficient documentation

## 2018-10-09 DIAGNOSIS — K5730 Diverticulosis of large intestine without perforation or abscess without bleeding: Secondary | ICD-10-CM | POA: Insufficient documentation

## 2018-10-09 DIAGNOSIS — F419 Anxiety disorder, unspecified: Secondary | ICD-10-CM | POA: Insufficient documentation

## 2018-10-09 DIAGNOSIS — Z96651 Presence of right artificial knee joint: Secondary | ICD-10-CM | POA: Insufficient documentation

## 2018-10-09 DIAGNOSIS — L723 Sebaceous cyst: Secondary | ICD-10-CM | POA: Insufficient documentation

## 2018-10-09 DIAGNOSIS — Z803 Family history of malignant neoplasm of breast: Secondary | ICD-10-CM | POA: Insufficient documentation

## 2018-10-09 DIAGNOSIS — G629 Polyneuropathy, unspecified: Secondary | ICD-10-CM | POA: Insufficient documentation

## 2018-10-09 DIAGNOSIS — G709 Myoneural disorder, unspecified: Secondary | ICD-10-CM | POA: Insufficient documentation

## 2018-10-09 DIAGNOSIS — M1990 Unspecified osteoarthritis, unspecified site: Secondary | ICD-10-CM | POA: Insufficient documentation

## 2018-10-09 DIAGNOSIS — Z79899 Other long term (current) drug therapy: Secondary | ICD-10-CM | POA: Insufficient documentation

## 2018-10-09 DIAGNOSIS — R001 Bradycardia, unspecified: Secondary | ICD-10-CM | POA: Insufficient documentation

## 2018-10-09 DIAGNOSIS — R100 Acute abdomen: Secondary | ICD-10-CM | POA: Insufficient documentation

## 2018-10-09 DIAGNOSIS — Z9049 Acquired absence of other specified parts of digestive tract: Secondary | ICD-10-CM | POA: Insufficient documentation

## 2018-10-09 DIAGNOSIS — Z8541 Personal history of malignant neoplasm of cervix uteri: Secondary | ICD-10-CM | POA: Insufficient documentation

## 2018-10-09 DIAGNOSIS — F17210 Nicotine dependence, cigarettes, uncomplicated: Secondary | ICD-10-CM | POA: Insufficient documentation

## 2018-10-09 DIAGNOSIS — Z833 Family history of diabetes mellitus: Secondary | ICD-10-CM | POA: Insufficient documentation

## 2018-10-09 DIAGNOSIS — D171 Benign lipomatous neoplasm of skin and subcutaneous tissue of trunk: Secondary | ICD-10-CM

## 2018-10-09 DIAGNOSIS — Z887 Allergy status to serum and vaccine status: Secondary | ICD-10-CM | POA: Insufficient documentation

## 2018-10-09 DIAGNOSIS — Z8249 Family history of ischemic heart disease and other diseases of the circulatory system: Secondary | ICD-10-CM | POA: Insufficient documentation

## 2018-10-09 DIAGNOSIS — Z87442 Personal history of urinary calculi: Secondary | ICD-10-CM | POA: Insufficient documentation

## 2018-10-09 DIAGNOSIS — R51 Headache: Secondary | ICD-10-CM | POA: Insufficient documentation

## 2018-10-09 DIAGNOSIS — Z885 Allergy status to narcotic agent status: Secondary | ICD-10-CM | POA: Insufficient documentation

## 2018-10-09 DIAGNOSIS — K449 Diaphragmatic hernia without obstruction or gangrene: Secondary | ICD-10-CM | POA: Insufficient documentation

## 2018-10-09 DIAGNOSIS — G8929 Other chronic pain: Secondary | ICD-10-CM | POA: Insufficient documentation

## 2018-10-09 DIAGNOSIS — Z825 Family history of asthma and other chronic lower respiratory diseases: Secondary | ICD-10-CM | POA: Insufficient documentation

## 2018-10-09 HISTORY — PX: LIPOMA EXCISION: SHX5283

## 2018-10-09 SURGERY — EXCISION LIPOMA
Anesthesia: General | Laterality: Right

## 2018-10-09 MED ORDER — TRAMADOL HCL 50 MG PO TABS
ORAL_TABLET | ORAL | Status: AC
  Administered 2018-10-09: 50 mg
  Filled 2018-10-09: qty 1

## 2018-10-09 MED ORDER — GLYCOPYRROLATE 0.2 MG/ML IJ SOLN
INTRAMUSCULAR | Status: DC | PRN
  Administered 2018-10-09: 0.2 mg via INTRAVENOUS

## 2018-10-09 MED ORDER — DEXAMETHASONE SODIUM PHOSPHATE 10 MG/ML IJ SOLN
INTRAMUSCULAR | Status: AC
  Filled 2018-10-09: qty 1

## 2018-10-09 MED ORDER — EPHEDRINE SULFATE 50 MG/ML IJ SOLN
INTRAMUSCULAR | Status: AC
  Filled 2018-10-09: qty 1

## 2018-10-09 MED ORDER — BUPIVACAINE HCL (PF) 0.25 % IJ SOLN
INTRAMUSCULAR | Status: DC | PRN
  Administered 2018-10-09: 20 mL

## 2018-10-09 MED ORDER — PHENYLEPHRINE HCL 10 MG/ML IJ SOLN
INTRAMUSCULAR | Status: DC | PRN
  Administered 2018-10-09 (×3): 100 ug via INTRAVENOUS

## 2018-10-09 MED ORDER — CELECOXIB 200 MG PO CAPS: 200.0000 mg | ORAL_CAPSULE | ORAL | Status: DC

## 2018-10-09 MED ORDER — EPHEDRINE SULFATE 50 MG/ML IJ SOLN
INTRAMUSCULAR | Status: DC | PRN
  Administered 2018-10-09 (×5): 5 mg via INTRAVENOUS

## 2018-10-09 MED ORDER — PROPOFOL 10 MG/ML IV BOLUS
INTRAVENOUS | Status: AC
  Filled 2018-10-09: qty 20

## 2018-10-09 MED ORDER — PHENYLEPHRINE HCL 10 MG/ML IJ SOLN
INTRAMUSCULAR | Status: AC
  Filled 2018-10-09: qty 1

## 2018-10-09 MED ORDER — GABAPENTIN 300 MG PO CAPS
ORAL_CAPSULE | ORAL | Status: AC
  Administered 2018-10-09: 300 mg
  Filled 2018-10-09: qty 1

## 2018-10-09 MED ORDER — PROPOFOL 10 MG/ML IV BOLUS
INTRAVENOUS | Status: DC | PRN
  Administered 2018-10-09: 200 mg via INTRAVENOUS

## 2018-10-09 MED ORDER — FAMOTIDINE 20 MG PO TABS: 20.0000 mg | ORAL_TABLET | Freq: Once | ORAL | Status: DC

## 2018-10-09 MED ORDER — MIDAZOLAM HCL 2 MG/2ML IJ SOLN
INTRAMUSCULAR | Status: AC
  Filled 2018-10-09: qty 2

## 2018-10-09 MED ORDER — OXYCODONE HCL 5 MG PO TABS: 5.0000 mg | ORAL_TABLET | Freq: Once | ORAL | Status: DC | PRN

## 2018-10-09 MED ORDER — FENTANYL CITRATE (PF) 100 MCG/2ML IJ SOLN
INTRAMUSCULAR | Status: AC
  Filled 2018-10-09: qty 2

## 2018-10-09 MED ORDER — CEFAZOLIN SODIUM-DEXTROSE 2-4 GM/100ML-% IV SOLN
INTRAVENOUS | Status: AC
  Filled 2018-10-09: qty 100

## 2018-10-09 MED ORDER — FAMOTIDINE 20 MG PO TABS
ORAL_TABLET | ORAL | Status: AC
  Administered 2018-10-09: 20 mg
  Filled 2018-10-09: qty 1

## 2018-10-09 MED ORDER — TRAMADOL HCL 50 MG PO TABS
50.0000 mg | ORAL_TABLET | Freq: Three times a day (TID) | ORAL | Status: DC | PRN
  Filled 2018-10-09: qty 1

## 2018-10-09 MED ORDER — ACETAMINOPHEN 500 MG PO TABS
1000.0000 mg | ORAL_TABLET | ORAL | Status: AC
  Administered 2018-10-09: 1000 mg via ORAL

## 2018-10-09 MED ORDER — FENTANYL CITRATE (PF) 100 MCG/2ML IJ SOLN
25.0000 ug | INTRAMUSCULAR | Status: DC | PRN
  Administered 2018-10-09 (×2): 25 ug via INTRAVENOUS

## 2018-10-09 MED ORDER — DEXAMETHASONE SODIUM PHOSPHATE 10 MG/ML IJ SOLN
INTRAMUSCULAR | Status: DC | PRN
  Administered 2018-10-09: 10 mg via INTRAVENOUS

## 2018-10-09 MED ORDER — TRAMADOL HCL 50 MG PO TABS: 50.0000 mg | ORAL_TABLET | Freq: Three times a day (TID) | ORAL | 0 refills | Status: DC | PRN

## 2018-10-09 MED ORDER — FENTANYL CITRATE (PF) 100 MCG/2ML IJ SOLN
INTRAMUSCULAR | Status: DC | PRN
  Administered 2018-10-09: 25 ug via INTRAVENOUS
  Administered 2018-10-09: 50 ug via INTRAVENOUS
  Administered 2018-10-09: 25 ug via INTRAVENOUS

## 2018-10-09 MED ORDER — LACTATED RINGERS IV SOLN
INTRAVENOUS | Status: DC
  Administered 2018-10-09: 07:00:00 via INTRAVENOUS

## 2018-10-09 MED ORDER — BUPIVACAINE HCL (PF) 0.25 % IJ SOLN
INTRAMUSCULAR | Status: AC
  Filled 2018-10-09: qty 30

## 2018-10-09 MED ORDER — CHLORHEXIDINE GLUCONATE CLOTH 2 % EX PADS: 6.0000 | MEDICATED_PAD | Freq: Once | CUTANEOUS | Status: DC

## 2018-10-09 MED ORDER — MIDAZOLAM HCL 2 MG/2ML IJ SOLN
INTRAMUSCULAR | Status: DC | PRN
  Administered 2018-10-09: 2 mg via INTRAVENOUS

## 2018-10-09 MED ORDER — ACETAMINOPHEN 500 MG PO TABS
ORAL_TABLET | ORAL | Status: AC
  Filled 2018-10-09: qty 2

## 2018-10-09 MED ORDER — CELECOXIB 200 MG PO CAPS
ORAL_CAPSULE | ORAL | Status: AC
  Administered 2018-10-09: 200 mg
  Filled 2018-10-09: qty 1

## 2018-10-09 MED ORDER — OXYCODONE HCL 5 MG/5ML PO SOLN: 5.0000 mg | Freq: Once | ORAL | Status: DC | PRN

## 2018-10-09 MED ORDER — LIDOCAINE HCL (CARDIAC) PF 100 MG/5ML IV SOSY
PREFILLED_SYRINGE | INTRAVENOUS | Status: DC | PRN
  Administered 2018-10-09: 100 mg via INTRAVENOUS

## 2018-10-09 MED ORDER — FENTANYL CITRATE (PF) 100 MCG/2ML IJ SOLN
INTRAMUSCULAR | Status: AC
  Administered 2018-10-09: 25 ug via INTRAVENOUS
  Filled 2018-10-09: qty 2

## 2018-10-09 MED ORDER — GABAPENTIN 300 MG PO CAPS: 300.0000 mg | ORAL_CAPSULE | ORAL | Status: DC

## 2018-10-09 SURGICAL SUPPLY — 35 items
CANISTER SUCT 1200ML W/VALVE (MISCELLANEOUS) ×2 IMPLANT
COVER WAND RF STERILE (DRAPES) IMPLANT
DRAPE LAPAROTOMY 100X77 ABD (DRAPES) ×2 IMPLANT
ELECT CAUTERY BLADE 6.4 (BLADE) ×2 IMPLANT
ELECT REM PT RETURN 9FT ADLT (ELECTROSURGICAL) ×2
ELECTRODE REM PT RTRN 9FT ADLT (ELECTROSURGICAL) ×1 IMPLANT
GLOVE BIO SURGEON STRL SZ7.5 (GLOVE) ×4 IMPLANT
GLOVE INDICATOR 8.0 STRL GRN (GLOVE) ×4 IMPLANT
GOWN STRL REUS W/ TWL LRG LVL3 (GOWN DISPOSABLE) ×2 IMPLANT
GOWN STRL REUS W/TWL LRG LVL3 (GOWN DISPOSABLE) ×2
KIT TURNOVER KIT A (KITS) ×2 IMPLANT
LABEL OR SOLS (LABEL) ×2 IMPLANT
NEEDLE HYPO 25X1 1.5 SAFETY (NEEDLE) ×2 IMPLANT
NS IRRIG 1000ML POUR BTL (IV SOLUTION) ×2 IMPLANT
PACK BASIN MAJOR ARMC (MISCELLANEOUS) ×2 IMPLANT
SET YANKAUER POOLE SUCT (MISCELLANEOUS) ×2 IMPLANT
SPONGE LAP 18X18 RF (DISPOSABLE) ×2 IMPLANT
STAPLER SKIN PROX 35W (STAPLE) IMPLANT
SUT MNCRL 4-0 (SUTURE) ×1
SUT MNCRL 4-0 27XMFL (SUTURE) ×1
SUT PROLENE 0 CT 1 30 (SUTURE) IMPLANT
SUT SILK 2 0 (SUTURE)
SUT SILK 2-0 30XBRD TIE 12 (SUTURE) IMPLANT
SUT SILK 3-0 (SUTURE) IMPLANT
SUT VIC AB 2-0 BRD 54 (SUTURE) IMPLANT
SUT VIC AB 2-0 CT1 27 (SUTURE) ×1
SUT VIC AB 2-0 CT1 TAPERPNT 27 (SUTURE) ×1 IMPLANT
SUT VIC AB 3-0 54X BRD REEL (SUTURE) ×1 IMPLANT
SUT VIC AB 3-0 BRD 54 (SUTURE) ×1
SUT VIC AB 3-0 SH 27 (SUTURE) ×1
SUT VIC AB 3-0 SH 27X BRD (SUTURE) ×1 IMPLANT
SUTURE MNCRL 4-0 27XMF (SUTURE) ×1 IMPLANT
SYR 10ML LL (SYRINGE) ×2 IMPLANT
SYR BULB IRRIG 60ML STRL (SYRINGE) ×2 IMPLANT
TRAY FOLEY MTR SLVR 16FR STAT (SET/KITS/TRAYS/PACK) IMPLANT

## 2018-10-09 NOTE — Anesthesia Postprocedure Evaluation (Signed)
Anesthesia Post Note  Patient: Ceazia Harb  Procedure(s) Performed: EXCISION LIPOMA- ABDOMINAL (Right )  Patient location during evaluation: PACU Anesthesia Type: General Level of consciousness: awake and alert Pain management: pain level controlled Vital Signs Assessment: post-procedure vital signs reviewed and stable Respiratory status: spontaneous breathing, nonlabored ventilation, respiratory function stable and patient connected to nasal cannula oxygen Cardiovascular status: blood pressure returned to baseline and stable Postop Assessment: no apparent nausea or vomiting Anesthetic complications: no     Last Vitals:  Vitals:   10/09/18 0904 10/09/18 0931  BP: 112/65 120/85  Pulse: 84 87  Resp: 16 16  Temp: (!) 36.3 C   SpO2: 100% 99%    Last Pain:  Vitals:   10/09/18 0931  TempSrc:   PainSc: 4                  Precious Haws Daysen Gundrum

## 2018-10-09 NOTE — Anesthesia Post-op Follow-up Note (Signed)
Anesthesia QCDR form completed.        

## 2018-10-09 NOTE — Brief Op Note (Signed)
10/09/2018  8:41 AM  PATIENT:  Amy Logan  47 y.o. female  PRE-OPERATIVE DIAGNOSIS:  LIPOMA  POST-OPERATIVE DIAGNOSIS:  sebaceous cyst  PROCEDURE:  Procedure(s): EXCISION LIPOMA- ABDOMINAL (Right)  SURGEON:  Surgeon(s) and Role:    Fredirick Maudlin, MD - Primary  PHYSICIAN ASSISTANT:   ASSISTANTS: none   ANESTHESIA:   General via LMA  EBL:  2 mL   BLOOD ADMINISTERED:none  DRAINS: none   LOCAL MEDICATIONS USED:  BUPIVICAINE 0.25%  SPECIMEN:  Epidermal inclusion cyst  DISPOSITION OF SPECIMEN:  PATHOLOGY  COUNTS:  YES  TOURNIQUET:  * No tourniquets in log *  DICTATION: .Dragon Dictation  PLAN OF CARE: Discharge to home after PACU  PATIENT DISPOSITION:  PACU - hemodynamically stable.   Delay start of Pharmacological VTE agent (>24hrs) due to surgical blood loss or risk of bleeding: not applicable

## 2018-10-09 NOTE — Anesthesia Procedure Notes (Signed)
Procedure Name: LMA Insertion Date/Time: 10/09/2018 7:38 AM Performed by: Lavone Orn, CRNA Pre-anesthesia Checklist: Patient identified, Emergency Drugs available, Suction available, Patient being monitored and Timeout performed Patient Re-evaluated:Patient Re-evaluated prior to induction Oxygen Delivery Method: Circle system utilized Preoxygenation: Pre-oxygenation with 100% oxygen Induction Type: IV induction Ventilation: Mask ventilation without difficulty LMA Size: 5.0 Number of attempts: 1 Placement Confirmation: positive ETCO2 and breath sounds checked- equal and bilateral Tube secured with: Tape Dental Injury: Teeth and Oropharynx as per pre-operative assessment

## 2018-10-09 NOTE — Transfer of Care (Signed)
Immediate Anesthesia Transfer of Care Note  Patient: Amy Logan  Procedure(s) Performed: EXCISION LIPOMA- ABDOMINAL (Right )  Patient Location: PACU  Anesthesia Type:General  Level of Consciousness: awake and patient cooperative  Airway & Oxygen Therapy: Patient Spontanous Breathing and Patient connected to face mask  Post-op Assessment: Report given to RN and Post -op Vital signs reviewed and stable  Post vital signs: stable  Last Vitals:  Vitals Value Taken Time  BP 117/66 10/09/2018  8:28 AM  Temp 36.4 C 10/09/2018  8:28 AM  Pulse 93 10/09/2018  8:30 AM  Resp 20 10/09/2018  8:30 AM  SpO2 99 % 10/09/2018  8:30 AM  Vitals shown include unvalidated device data.  Last Pain:  Vitals:   10/09/18 0828  TempSrc:   PainSc: 0-No pain         Complications: No apparent anesthesia complications

## 2018-10-09 NOTE — Op Note (Signed)
Operative Note  Preoperative Diagnosis:   Abdominal wall mass (suspect lipoma)  Postoperative Diagnosis:  Giant sebaceous cyst (epidermal inclusion cyst)  Operation:  Excision of skin lesion (trunk) >4.0 cm  Surgeon: Fredirick Maudlin, MD  Assistant: none  Anesthesia: general via LMA, local (0.25% bupivicaine)  Findings: 9 x 6 cm epidermal inclusion cyst  Indications: Amy Logan is a 47 year old woman who had an abdominal mass on the right side just below her ribs.  She stated that it had grown over time and was becoming uncomfortable, particularly if it bumped or rubbed against anything.  She was seen in outpatient surgery clinic and sought excision.  Bedside clinical ultrasound suggested a soft tissue mass in the subcutaneum.  The differential diagnosis included a lipoma versus a large epidermal inclusion cyst.  Procedure In Detail: The patient was identified in the preoperative holding area.  The mass was appropriately marked.  Once again the risks of the operation were discussed with the patient and her significant other.  These include, but are not limited to: bleeding, infection, injury to surrounding tissues, recurrence, hematoma or seroma  formation.  They had an opportunity to ask questions and all of these were answered to their satisfaction.  The patient was brought to the operating room and placed supine on the OR table.  Bony prominences were padded and bilateral sequential compression devices were in place on the lower extremities.  Anesthesia was induced via laryngeal mask airway.  The patient was positioned appropriately for the operation.  She was sterilely prepped and draped in standard fashion.  A timeout was performed confirming the patient's identity the procedure room performed her allergies all necessary equipment was available and that maintenance anesthesia was adequate.  Perioperative dose of antibiotics was administered by the anesthesia team.  Bupivicaine 0.25% was  infiltrated into the skin surrounding the mass. The skin overlying the lesion was incised sharply.  Upon doing so, the capsule of what turned out to be a large epidermal inclusion cyst was violated.  Thick caseous material spewed forth.  The lesion was circumferentially dissected out, taking care to include all of the cyst wall in the dissection.  This was accomplished using primarily electrocautery and sharp dissection.  The cyst was then entirely removed, measured, and handed off as a specimen.  The wound bed was copiously irrigated and additional local anesthesia was infiltrated into the skin and wound bed.  The wound was then closed in 2 layers with a deep dermal layer of 3-0 Vicryl in a running subcuticular layer of 4-0 Monocryl.  The skin was cleaned and a sterile dressing was applied.  The patient was awakened extubated and taken to the postanesthesia care unit in good condition.  EBL: less than 5 cc  IVF: 800 cc of crystalloid  Specimen(s): giant epidermal inclusion cyst for permanent  Complications: none immediately apparent.   Counts: all needles, instruments, and sponges were counted and reported to be correct in number at the end of the case.   I was present for and participated in the entire operation.  Fredirick Maudlin 11.20.19 8:49 AM

## 2018-10-09 NOTE — Anesthesia Preprocedure Evaluation (Signed)
Anesthesia Evaluation  Patient identified by MRN, date of birth, ID band Patient awake    Reviewed: Allergy & Precautions, H&P , NPO status , Patient's Chart, lab work & pertinent test results  History of Anesthesia Complications Negative for: history of anesthetic complications  Airway Mallampati: II  TM Distance: >3 FB Neck ROM: full    Dental  (+) Chipped, Poor Dentition, Missing, Partial Lower   Pulmonary Current Smoker,           Cardiovascular Exercise Tolerance: Good hypertension, (-) angina(-) Past MI and (-) DOE      Neuro/Psych PSYCHIATRIC DISORDERS  Neuromuscular disease    GI/Hepatic Neg liver ROS, hiatal hernia, GERD  Medicated and Controlled,  Endo/Other  negative endocrine ROS  Renal/GU      Musculoskeletal  (+) Arthritis , Fibromyalgia -  Abdominal   Peds  Hematology negative hematology ROS (+)   Anesthesia Other Findings Past Medical History: No date: Anemia No date: Anxiety No date: Arthritis No date: Bradycardia 1990: Cancer ([place])     Comment:  cervical cells stage 4 No date: Fibromyalgia No date: History of hiatal hernia     Comment:  SMALL No date: History of kidney stones     Comment:  H/O No date: Hypertension No date: Neuropathy  Past Surgical History: No date: ABDOMINAL HYSTERECTOMY No date: APPENDECTOMY No date: BREAST CYST ASPIRATION; Left     Comment:  neg No date: CHOLECYSTECTOMY 2018: COLONOSCOPY No date: cyst removed     Comment:  thyroglossal duct cyst No date: JOINT REPLACEMENT No date: REPLACEMENT TOTAL KNEE     Comment:  Right knee  BMI    Body Mass Index:  34.22 kg/m      Reproductive/Obstetrics negative OB ROS                             Anesthesia Physical Anesthesia Plan  ASA: III  Anesthesia Plan: General LMA   Post-op Pain Management:    Induction: Intravenous  PONV Risk Score and Plan: Dexamethasone, Ondansetron,  Midazolam and Treatment may vary due to age or medical condition  Airway Management Planned: LMA  Additional Equipment:   Intra-op Plan:   Post-operative Plan: Extubation in OR  Informed Consent: I have reviewed the patients History and Physical, chart, labs and discussed the procedure including the risks, benefits and alternatives for the proposed anesthesia with the patient or authorized representative who has indicated his/her understanding and acceptance.   Dental Advisory Given  Plan Discussed with: Anesthesiologist, CRNA and Surgeon  Anesthesia Plan Comments: (Patient consented for risks of anesthesia including but not limited to:  - adverse reactions to medications - damage to teeth, lips or other oral mucosa - sore throat or hoarseness - Damage to heart, brain, lungs or loss of life  Patient voiced understanding.)        Anesthesia Quick Evaluation

## 2018-10-09 NOTE — Interval H&P Note (Signed)
History and Physical Interval Note:  10/09/2018 7:20 AM  Amy Logan  has presented today for surgery, with the diagnosis of LIPOMA  The various methods of treatment have been discussed with the patient and family. After consideration of risks, benefits and other options for treatment, the patient has consented to  Procedure(s): EXCISION LIPOMA- ABDOMINAL WALL MASS (Right) as a surgical intervention .  The patient's history has been reviewed, patient examined, no change in status, stable for surgery.  I have reviewed the patient's chart and labs.  Questions were answered to the patient's satisfaction.     Fredirick Maudlin

## 2018-10-09 NOTE — Discharge Instructions (Signed)

## 2018-10-10 ENCOUNTER — Encounter: Payer: Self-pay | Admitting: General Surgery

## 2018-10-10 LAB — SURGICAL PATHOLOGY

## 2018-10-14 ENCOUNTER — Other Ambulatory Visit: Payer: Self-pay

## 2018-10-14 ENCOUNTER — Emergency Department: Payer: 59

## 2018-10-14 ENCOUNTER — Encounter: Payer: Self-pay | Admitting: Emergency Medicine

## 2018-10-14 ENCOUNTER — Observation Stay
Admission: EM | Admit: 2018-10-14 | Discharge: 2018-10-15 | Disposition: A | Discharge status: Home / Self Care | Payer: 59 | Attending: Internal Medicine | Admitting: Internal Medicine

## 2018-10-14 DIAGNOSIS — A419 Sepsis, unspecified organism: Secondary | ICD-10-CM

## 2018-10-14 DIAGNOSIS — Z8249 Family history of ischemic heart disease and other diseases of the circulatory system: Secondary | ICD-10-CM | POA: Insufficient documentation

## 2018-10-14 DIAGNOSIS — I10 Essential (primary) hypertension: Secondary | ICD-10-CM | POA: Diagnosis not present

## 2018-10-14 DIAGNOSIS — M797 Fibromyalgia: Secondary | ICD-10-CM | POA: Diagnosis not present

## 2018-10-14 DIAGNOSIS — Z91018 Allergy to other foods: Secondary | ICD-10-CM

## 2018-10-14 DIAGNOSIS — R112 Nausea with vomiting, unspecified: Secondary | ICD-10-CM | POA: Diagnosis not present

## 2018-10-14 DIAGNOSIS — Z9889 Other specified postprocedural states: Secondary | ICD-10-CM | POA: Diagnosis not present

## 2018-10-14 DIAGNOSIS — D72829 Elevated white blood cell count, unspecified: Secondary | ICD-10-CM | POA: Diagnosis not present

## 2018-10-14 DIAGNOSIS — E860 Dehydration: Secondary | ICD-10-CM | POA: Insufficient documentation

## 2018-10-14 DIAGNOSIS — F17210 Nicotine dependence, cigarettes, uncomplicated: Secondary | ICD-10-CM | POA: Diagnosis not present

## 2018-10-14 DIAGNOSIS — Z8541 Personal history of malignant neoplasm of cervix uteri: Secondary | ICD-10-CM | POA: Insufficient documentation

## 2018-10-14 DIAGNOSIS — Z96651 Presence of right artificial knee joint: Secondary | ICD-10-CM | POA: Diagnosis not present

## 2018-10-14 DIAGNOSIS — Z79899 Other long term (current) drug therapy: Secondary | ICD-10-CM | POA: Diagnosis not present

## 2018-10-14 DIAGNOSIS — G629 Polyneuropathy, unspecified: Secondary | ICD-10-CM | POA: Insufficient documentation

## 2018-10-14 DIAGNOSIS — R6510 Systemic inflammatory response syndrome (SIRS) of non-infectious origin without acute organ dysfunction: Principal | ICD-10-CM | POA: Diagnosis present

## 2018-10-14 DIAGNOSIS — F17200 Nicotine dependence, unspecified, uncomplicated: Secondary | ICD-10-CM

## 2018-10-14 DIAGNOSIS — R1110 Vomiting, unspecified: Secondary | ICD-10-CM

## 2018-10-14 DIAGNOSIS — M1990 Unspecified osteoarthritis, unspecified site: Secondary | ICD-10-CM | POA: Insufficient documentation

## 2018-10-14 DIAGNOSIS — Z87892 Personal history of anaphylaxis: Secondary | ICD-10-CM | POA: Insufficient documentation

## 2018-10-14 DIAGNOSIS — Z885 Allergy status to narcotic agent status: Secondary | ICD-10-CM | POA: Diagnosis not present

## 2018-10-14 DIAGNOSIS — Z872 Personal history of diseases of the skin and subcutaneous tissue: Secondary | ICD-10-CM

## 2018-10-14 DIAGNOSIS — Z888 Allergy status to other drugs, medicaments and biological substances status: Secondary | ICD-10-CM | POA: Diagnosis not present

## 2018-10-14 LAB — CBC WITH DIFFERENTIAL/PLATELET
Abs Immature Granulocytes: 0.36 10*3/uL — ABNORMAL HIGH (ref 0.00–0.07)
Basophils Absolute: 0.1 10*3/uL (ref 0.0–0.1)
Basophils Relative: 0 %
EOS PCT: 1 %
Eosinophils Absolute: 0.3 10*3/uL (ref 0.0–0.5)
HEMATOCRIT: 47.2 % — AB (ref 36.0–46.0)
HEMOGLOBIN: 15.5 g/dL — AB (ref 12.0–15.0)
IMMATURE GRANULOCYTES: 1 %
Lymphocytes Relative: 2 %
Lymphs Abs: 0.8 10*3/uL (ref 0.7–4.0)
MCH: 32.6 pg (ref 26.0–34.0)
MCHC: 32.8 g/dL (ref 30.0–36.0)
MCV: 99.4 fL (ref 80.0–100.0)
MONO ABS: 1.2 10*3/uL — AB (ref 0.1–1.0)
MONOS PCT: 3 %
NEUTROS PCT: 93 %
Neutro Abs: 33.2 10*3/uL — ABNORMAL HIGH (ref 1.7–7.7)
Platelets: 323 10*3/uL (ref 150–400)
RBC: 4.75 MIL/uL (ref 3.87–5.11)
RDW: 13.4 % (ref 11.5–15.5)
SMEAR REVIEW: NORMAL
WBC: 35.9 10*3/uL — ABNORMAL HIGH (ref 4.0–10.5)
nRBC: 0 % (ref 0.0–0.2)

## 2018-10-14 LAB — URINALYSIS, COMPLETE (UACMP) WITH MICROSCOPIC
BILIRUBIN URINE: NEGATIVE
Glucose, UA: NEGATIVE mg/dL
KETONES UR: NEGATIVE mg/dL
LEUKOCYTES UA: NEGATIVE
NITRITE: NEGATIVE
Protein, ur: NEGATIVE mg/dL
Specific Gravity, Urine: >1.046 — ABNORMAL HIGH (ref 1.005–1.030)
pH: 5 (ref 5.0–8.0)

## 2018-10-14 LAB — COMPREHENSIVE METABOLIC PANEL
ALT: 15 U/L (ref 0–44)
AST: 15 U/L (ref 15–41)
Albumin: 4.3 g/dL (ref 3.5–5.0)
Alkaline Phosphatase: 54 U/L (ref 38–126)
Anion gap: 10 (ref 5–15)
BILIRUBIN TOTAL: 0.6 mg/dL (ref 0.3–1.2)
BUN: 21 mg/dL — AB (ref 6–20)
CO2: 20 mmol/L — ABNORMAL LOW (ref 22–32)
CREATININE: 0.69 mg/dL (ref 0.44–1.00)
Calcium: 8.8 mg/dL — ABNORMAL LOW (ref 8.9–10.3)
Chloride: 106 mmol/L (ref 98–111)
GFR calc Af Amer: >60 mL/min (ref >60)
Glucose, Bld: 128 mg/dL — ABNORMAL HIGH (ref 70–99)
POTASSIUM: 4.1 mmol/L (ref 3.5–5.1)
Sodium: 136 mmol/L (ref 135–145)
Total Protein: 8.1 g/dL (ref 6.5–8.1)

## 2018-10-14 LAB — LIPASE, BLOOD: LIPASE: 33 U/L (ref 11–51)

## 2018-10-14 LAB — PROCALCITONIN: Procalcitonin: 0.16 ng/mL

## 2018-10-14 LAB — INFLUENZA PANEL BY PCR (TYPE A & B)
INFLAPCR: NEGATIVE
Influenza B By PCR: NEGATIVE

## 2018-10-14 LAB — FERRITIN: Ferritin: 84 ng/mL (ref 11–307)

## 2018-10-14 LAB — CG4 I-STAT (LACTIC ACID): LACTIC ACID, VENOUS: 0.83 mmol/L (ref 0.5–1.9)

## 2018-10-14 MED ORDER — INFLUENZA VAC SPLIT QUAD 0.5 ML IM SUSY: 0.5000 mL | PREFILLED_SYRINGE | INTRAMUSCULAR | Status: DC

## 2018-10-14 MED ORDER — MISOPROSTOL 200 MCG PO TABS
200.0000 ug | ORAL_TABLET | Freq: Once | ORAL | Status: AC
  Administered 2018-10-14: 200 ug via ORAL
  Filled 2018-10-14: qty 1

## 2018-10-14 MED ORDER — FENTANYL CITRATE (PF) 100 MCG/2ML IJ SOLN
100.0000 ug | Freq: Once | INTRAMUSCULAR | Status: AC
  Administered 2018-10-14: 100 ug via INTRAVENOUS
  Filled 2018-10-14: qty 2

## 2018-10-14 MED ORDER — DOCUSATE SODIUM 100 MG PO CAPS
100.0000 mg | ORAL_CAPSULE | Freq: Two times a day (BID) | ORAL | Status: DC
  Administered 2018-10-14 (×2): 100 mg via ORAL
  Filled 2018-10-14 (×2): qty 1

## 2018-10-14 MED ORDER — VITAMIN D (ERGOCALCIFEROL) 1.25 MG (50000 UNIT) PO CAPS: 50000.0000 [IU] | ORAL_CAPSULE | ORAL | Status: DC

## 2018-10-14 MED ORDER — SODIUM CHLORIDE 0.9 % IV SOLN
2.0000 g | Freq: Once | INTRAVENOUS | Status: AC
  Administered 2018-10-14: 2 g via INTRAVENOUS
  Filled 2018-10-14: qty 2

## 2018-10-14 MED ORDER — DULOXETINE HCL 30 MG PO CPEP
60.0000 mg | ORAL_CAPSULE | Freq: Every day | ORAL | Status: DC
  Filled 2018-10-14: qty 2

## 2018-10-14 MED ORDER — HALOPERIDOL LACTATE 5 MG/ML IJ SOLN
INTRAMUSCULAR | Status: AC
  Filled 2018-10-14: qty 1

## 2018-10-14 MED ORDER — HEPARIN SODIUM (PORCINE) 5000 UNIT/ML IJ SOLN
5000.0000 [IU] | Freq: Three times a day (TID) | INTRAMUSCULAR | Status: DC
  Filled 2018-10-14 (×2): qty 1

## 2018-10-14 MED ORDER — SUCRALFATE 1 G PO TABS
1.0000 g | ORAL_TABLET | Freq: Once | ORAL | Status: AC
  Administered 2018-10-14: 1 g via ORAL
  Filled 2018-10-14: qty 1

## 2018-10-14 MED ORDER — SODIUM CHLORIDE 0.9 % IV SOLN
2.0000 g | INTRAVENOUS | Status: DC
  Administered 2018-10-14: 17:00:00 2 g via INTRAVENOUS
  Filled 2018-10-14: qty 20
  Filled 2018-10-14: qty 2

## 2018-10-14 MED ORDER — TRAZODONE HCL 50 MG PO TABS: 25.0000 mg | ORAL_TABLET | Freq: Every evening | ORAL | Status: DC | PRN

## 2018-10-14 MED ORDER — SODIUM CHLORIDE 0.9 % IV BOLUS: 1000.0000 mL | Freq: Once | INTRAVENOUS | Status: AC

## 2018-10-14 MED ORDER — VANCOMYCIN HCL IN DEXTROSE 1-5 GM/200ML-% IV SOLN
1000.0000 mg | Freq: Once | INTRAVENOUS | Status: AC
  Administered 2018-10-14: 1000 mg via INTRAVENOUS
  Filled 2018-10-14: qty 200

## 2018-10-14 MED ORDER — METOCLOPRAMIDE HCL 5 MG/ML IJ SOLN
5.0000 mg | Freq: Four times a day (QID) | INTRAMUSCULAR | Status: DC
  Administered 2018-10-14 – 2018-10-15 (×4): 5 mg via INTRAVENOUS
  Filled 2018-10-14 (×4): qty 2

## 2018-10-14 MED ORDER — FENTANYL CITRATE (PF) 100 MCG/2ML IJ SOLN: 25.0000 ug | INTRAMUSCULAR | Status: DC | PRN

## 2018-10-14 MED ORDER — SODIUM CHLORIDE 0.9 % IV SOLN
INTRAVENOUS | Status: DC
  Administered 2018-10-14 – 2018-10-15 (×2): via INTRAVENOUS

## 2018-10-14 MED ORDER — ALUM & MAG HYDROXIDE-SIMETH 200-200-20 MG/5ML PO SUSP
30.0000 mL | Freq: Once | ORAL | Status: AC
  Administered 2018-10-14: 30 mL via ORAL
  Filled 2018-10-14: qty 30

## 2018-10-14 MED ORDER — SODIUM CHLORIDE 0.9 % IV BOLUS
1000.0000 mL | Freq: Once | INTRAVENOUS | Status: AC
  Administered 2018-10-14: 1000 mL via INTRAVENOUS

## 2018-10-14 MED ORDER — BISACODYL 5 MG PO TBEC: 5.0000 mg | DELAYED_RELEASE_TABLET | Freq: Every day | ORAL | Status: DC | PRN

## 2018-10-14 MED ORDER — IOPAMIDOL (ISOVUE-300) INJECTION 61%
100.0000 mL | Freq: Once | INTRAVENOUS | Status: AC | PRN
  Administered 2018-10-14: 100 mL via INTRAVENOUS

## 2018-10-14 MED ORDER — VANCOMYCIN HCL 10 G IV SOLR
1250.0000 mg | Freq: Two times a day (BID) | INTRAVENOUS | Status: DC
  Filled 2018-10-14: qty 1250

## 2018-10-14 MED ORDER — ACETAMINOPHEN 325 MG PO TABS: 650.0000 mg | ORAL_TABLET | Freq: Four times a day (QID) | ORAL | Status: DC | PRN

## 2018-10-14 MED ORDER — METRONIDAZOLE IN NACL 5-0.79 MG/ML-% IV SOLN
500.0000 mg | Freq: Three times a day (TID) | INTRAVENOUS | Status: DC
  Administered 2018-10-14: 09:00:00 500 mg via INTRAVENOUS
  Filled 2018-10-14 (×3): qty 100

## 2018-10-14 MED ORDER — SODIUM CHLORIDE 0.9 % IV SOLN
2.0000 g | Freq: Three times a day (TID) | INTRAVENOUS | Status: DC
  Administered 2018-10-14: 2 g via INTRAVENOUS
  Filled 2018-10-14 (×2): qty 2

## 2018-10-14 MED ORDER — ACETAMINOPHEN 650 MG RE SUPP: 650.0000 mg | Freq: Four times a day (QID) | RECTAL | Status: DC | PRN

## 2018-10-14 MED ORDER — HALOPERIDOL LACTATE 5 MG/ML IJ SOLN
5.0000 mg | Freq: Once | INTRAMUSCULAR | Status: AC
  Administered 2018-10-14: 5 mg via INTRAVENOUS

## 2018-10-14 NOTE — ED Triage Notes (Signed)
Patient with complaint of nausea and vomiting that started about 22:30 last night. Patient states that she has vomited times 10. Patient states that she is having right lower abdominal pain from a cyst removal on Wednesday. Dressing clean, dry and intact.

## 2018-10-14 NOTE — ED Notes (Signed)
md into speak with pt

## 2018-10-14 NOTE — Consult Note (Signed)
NAME: Amy Logan  DOB: 1971-05-06  MRN: [id number]  Date/Time: 10/14/2018 10:58 AM  Consult requested by Dr.Shah Subjective:  REASON FOR CONSULT: SIRS with unknown source, severe leucocytosis ? Amy Logan is a 47 y.o.female  with a history of RT TKA, HTN recent surgery for abdominal wall lesion on 10/09/18 which was an  epidermoid cyst is admitted with sudden onset vomiting and feeling hot and cold Pt had a mass removed from the abdominal wall on 11/20 byt Dr Celine Ahr. The site has healed well . She developed sudden onset nausea and vomiting midnight and came to the ED. She says she woke up around midnight with nausea and had atleast 10 bouts of vomiting. No blood in the vomit She and her husband ate at [facility] Tuesday at 2 pm yesterday. They both had steak and she had broccoli and baked potato. He is not sick She feels very dehydrated. The vomiting caused her to have some abdominal pain. No diarrhea. No redness or pain at the site of surgery. No fever I am asked to see her because of severe leucocytosis No recent antibiotic ( other than surgical prophylaxis with 1 dose of cefazolin) No travel, no steroid use Past Medical History:  Diagnosis Date  . Anemia   . Anxiety   . Arthritis   . Bradycardia   . Cancer ([place]) 1990   cervical cells stage 4  . Fibromyalgia   . History of hiatal hernia    SMALL  . History of kidney stones    H/O  . Hypertension   . Neuropathy     Past Surgical History:  Procedure Laterality Date  . ABDOMINAL HYSTERECTOMY    . APPENDECTOMY    . BREAST CYST ASPIRATION Left    neg  . CHOLECYSTECTOMY    . COLONOSCOPY  2018  . cyst removed     thyroglossal duct cyst  . JOINT REPLACEMENT    . LIPOMA EXCISION Right 10/09/2018   Procedure: EXCISION LIPOMA- ABDOMINAL;  Surgeon: Fredirick Maudlin, MD;  Location: [facility] ORS;  Service: General;  Laterality: Right;  . REPLACEMENT TOTAL KNEE     Right knee    SH lives with her husband Smoker  Family History  Problem  Relation Age of Onset  . Diabetes Mother   . Heart failure Mother   . Asthma Mother   . Cancer Father   . Breast cancer Cousin 91       mat cousin   Allergies  Allergen Reactions  . Onion Anaphylaxis  . Coconut Flavor Hives  . Demerol [Meperidine] Hives    agitated   . Morphine And Related Hives    Agitated   . Phenergan [Promethazine Hcl]     Tremors/ spasms   . Stadol [Butorphanol] Hives    Increases BP  . Zofran [Ondansetron Hcl] Hives  . Imitrex [Sumatriptan] Palpitations   ? Current Facility-Administered Medications  Medication Dose Route Frequency Provider Last Rate Last Dose  . 0.9 %  sodium chloride infusion   Intravenous Continuous Max Sane, MD 75 mL/hr at 10/14/18 0915    . acetaminophen (TYLENOL) tablet 650 mg  650 mg Oral Q6H PRN Max Sane, MD       Or  . acetaminophen (TYLENOL) suppository 650 mg  650 mg Rectal Q6H PRN Max Sane, MD      . bisacodyl (DULCOLAX) EC tablet 5 mg  5 mg Oral Daily PRN Max Sane, MD      . docusate sodium (COLACE) capsule 100 mg  100 mg Oral BID Max Sane, MD   100 mg at 10/14/18 0955  . DULoxetine (CYMBALTA) DR capsule 60 mg  60 mg Oral QHS Manuella Ghazi, Vipul, MD      . fentaNYL (SUBLIMAZE) injection 25 mcg  25 mcg Intravenous Q2H PRN Max Sane, MD      . heparin injection 5,000 Units  5,000 Units Subcutaneous Q8H Max Sane, MD      . Derrill Memo ON 10/15/2018] Influenza vac split quadrivalent PF (FLUARIX) injection 0.5 mL  0.5 mL Intramuscular Tomorrow-1000 Manuella Ghazi, Vipul, MD      . metoCLOPramide (REGLAN) injection 5 mg  5 mg Intravenous Q6H Max Sane, MD   5 mg at 10/14/18 0914  . metroNIDAZOLE (FLAGYL) IVPB 500 mg  500 mg Intravenous Q8H Darel Hong, MD 100 mL/hr at 10/14/18 0917 500 mg at 10/14/18 0917  . traZODone (DESYREL) tablet 25 mg  25 mg Oral QHS PRN Max Sane, MD      . vancomycin (VANCOCIN) IVPB 1000 mg/200 mL premix  1,000 mg Intravenous Once Darel Hong, MD 200 mL/hr at 10/14/18 1038 1,000 mg at 10/14/18 1038   . [START ON 10/18/2018] Vitamin D (Ergocalciferol) (DRISDOL) capsule 50,000 Units  50,000 Units Oral Q Babs Sciara, MD         Abtx:  Anti-infectives (From admission, onward)   Start     Dose/Rate Route Frequency Ordered Stop   10/14/18 0715  ceFEPIme (MAXIPIME) 2 g in sodium chloride 0.9 % 100 mL IVPB     2 g 200 mL/hr over 30 Minutes Intravenous  Once 10/14/18 0702 10/14/18 0812   10/14/18 0715  metroNIDAZOLE (FLAGYL) IVPB 500 mg     500 mg 100 mL/hr over 60 Minutes Intravenous Every 8 hours 10/14/18 0702     10/14/18 0715  vancomycin (VANCOCIN) IVPB 1000 mg/200 mL premix     1,000 mg 200 mL/hr over 60 Minutes Intravenous  Once 10/14/18 0702        REVIEW OF SYSTEMS:  Const: negative fever, feeling hot and cold with sweats chills, negative weight loss Eyes: negative diplopia or visual changes, negative eye pain ENT: negative coryza, negative sore throat Resp: negative cough, hemoptysis, dyspnea Cards: negative for chest pain, palpitations, lower extremity edema GU: negative for frequency, dysuria and hematuria GI: Negative for abdominal pain, diarrhea, bleeding, constipation Skin: negative for rash and pruritus Heme: negative for easy bruising and gum/nose bleeding MS: negative for myalgias, arthralgias, back pain and muscle weakness, fatigue Neurolo:negative for headaches, dizziness, vertigo, memory problems  Psych: negative for feelings of anxiety, depression  Endocrine: negative for polyuria  Allergy/Immunology- negative for any antibioitc allergies ? Objective:  VITALS:  BP (!) 140/120   Pulse 93   Temp 98.3 F (36.8 C) (Oral)   Resp (!) 23   Ht 5\' 6"  (1.676 m)   Wt 92.5 kg   SpO2 99%   BMI 32.93 kg/m  PHYSICAL EXAM:  General: Alert, cooperative, no distress, appears stated age.  Head: Normocephalic, without obvious abnormality, atraumatic. Eyes: Conjunctivae clear, anicteric sclerae. Pupils are equal ENT Nares normal. No drainage or sinus  tenderness. Lips, mucosa, and tongue normal. No Thrush Neck: Supple, symmetrical, no adenopathy, thyroid: non tender no carotid bruit and no JVD. Back: No CVA tenderness. Lungs: Clear to auscultation bilaterally. No Wheezing or Rhonchi. No rales. Heart: Regular rate and rhythm, no murmur, rub or gallop. Abdomen: Soft, non-tender,not distended. Bowel sounds normal. No masses, rt sided surgical incision site looks well- no erythema or tenderness  or discharge Extremities: atraumatic, no cyanosis. No edema. No clubbing Skin: No rashes or lesions. Or bruising Lymph: Cervical, supraclavicular normal. Neurologic: Grossly non-focal Pertinent Labs Lab Results CBC    Component Value Date/Time   WBC 35.9 (H) 10/14/2018 0531   RBC 4.75 10/14/2018 0531   HGB 15.5 (H) 10/14/2018 0531   HGB 12.9 06/18/2014 1704   HCT 47.2 (H) 10/14/2018 0531   HCT 38.9 06/18/2014 1704   PLT 323 10/14/2018 0531   PLT 243 06/18/2014 1704   MCV 99.4 10/14/2018 0531   MCV 100 06/18/2014 1704   MCH 32.6 10/14/2018 0531   MCHC 32.8 10/14/2018 0531   RDW 13.4 10/14/2018 0531   RDW 13.7 06/18/2014 1704   LYMPHSABS 0.8 10/14/2018 0531   LYMPHSABS 2.9 03/06/2013 0546   MONOABS 1.2 (H) 10/14/2018 0531   MONOABS 1.0 (H) 03/06/2013 0546   EOSABS 0.3 10/14/2018 0531   EOSABS 0.3 03/06/2013 0546   BASOSABS 0.1 10/14/2018 0531   BASOSABS 0.0 03/06/2013 0546    CMP Latest Ref Rng & Units 10/14/2018 10/07/2018 03/28/2018  Glucose 70 - 99 mg/dL 128(H) - 83  BUN 6 - 20 mg/dL 21(H) - 18  Creatinine 0.44 - 1.00 mg/dL 0.69 - 0.84  Sodium 135 - 145 mmol/L 136 - 132(L)  Potassium 3.5 - 5.1 mmol/L 4.1 3.9 3.6  Chloride 98 - 111 mmol/L 106 - 99(L)  CO2 22 - 32 mmol/L 20(L) - 28  Calcium 8.9 - 10.3 mg/dL 8.8(L) - 8.8(L)  Total Protein 6.5 - 8.1 g/dL 8.1 - 7.6  Total Bilirubin 0.3 - 1.2 mg/dL 0.6 - 0.4  Alkaline Phos 38 - 126 U/L 54 - 54  AST 15 - 41 U/L 15 - 15  ALT 0 - 44 U/L 15 - 14      Microbiology: 11/25 [facility]  neg UA neg  IMAGING RESULTS: ?CT abdomen and pelvis- no abnormality  Impression/Recommendation ?47 y.o.female  with a history of RT TKA, HTN recent surgery for abdominal wall lesion on 10/09/18 which was an  epidermoid cyst is admitted with sudden onset vomiting and feeling hot and cold  Sudden onset Vomiting ?with feeling hot and cold- no Gi pathoology like cholecystis . ? Food related vomiting ? Due to toxin   Leucocytosis- she has baseline leucocytosis aorund 16, now very high- component of dehydration. Does not look septic. No surgical site infection as always concern for strep infection post surgery. But site looks fine Currently on vanco/flagyl and cefepime- de-escalate to just ceftriaxone. Will have to investigate as Op why she has baseline leucocytosis  HTn- on meds  Recent anterior abdominal wall epidermoid cyst removal   ? ___________________________________________________ Discussed with patient, her husband and her nurse

## 2018-10-14 NOTE — Consult Note (Signed)
CODE SEPSIS - PHARMACY COMMUNICATION  **Broad Spectrum Antibiotics should be administered within 1 hour of Sepsis diagnosis**  Time Code Sepsis Called/Page Received: 0703  Antibiotics Ordered: Cefepime, Metronidazole, Vancomycin  Time of 1st antibiotic administration: 0725  Additional action taken by pharmacy: None at this time  If necessary, Name of Provider/Nurse Contacted: Henry ,PharmD Clinical Pharmacist  10/14/2018  7:36 AM

## 2018-10-14 NOTE — Progress Notes (Signed)
Pharmacy Antibiotic Note  Amy Logan is a 47 y.o. female admitted on 10/14/2018 with sepsis.  Pharmacy has been consulted for Vancomycin and Cefepime dosing.  Plan: Will order Vancomycin 1250mg  IV q12h with a 8 hour stacked dose. Will check a Vancomycin trough level prior to 5th dose. Goal trough is 15-58mcg/ml Will order Cefepime 2g IV q8h  Height: 5\' 6"  (167.6 cm) Weight: 204 lb (92.5 kg) IBW/kg (Calculated) : 59.3  Temp (24hrs), Avg:98.1 F (36.7 C), Min:97.8 F (36.6 C), Max:98.3 F (36.8 C)  Recent Labs  Lab 10/14/18 0531 10/14/18 0550  WBC 35.9*  --   CREATININE 0.69  --   LATICACIDVEN  --  0.83    Estimated Creatinine Clearance: 98.6 mL/min (by C-G formula based on SCr of 0.69 mg/dL).    Allergies  Allergen Reactions  . Onion Anaphylaxis  . Coconut Flavor Hives  . Demerol [Meperidine] Hives    agitated   . Morphine And Related Hives    Agitated   . Phenergan [Promethazine Hcl]     Tremors/ spasms   . Stadol [Butorphanol] Hives    Increases BP  . Zofran [Ondansetron Hcl] Hives  . Imitrex [Sumatriptan] Palpitations    Antimicrobials this admission: Cefepime 11/25 >>  Vancomycin 11/25 >>  Metronidazole 11/25 >>  Thank you for allowing pharmacy to be a part of this patient's care.  Paulina Fusi, PharmD, BCPS 10/14/2018 11:11 AM

## 2018-10-14 NOTE — ED Notes (Signed)
Pt reports improved nausea. Po fluids provided by md.

## 2018-10-14 NOTE — Progress Notes (Signed)
Chaplain responded to an OR for an AD. Pt was tired and asked Chaplain to return. Husband was at the bedside. Chaplain left brochure for husband to review and will return in the afternoon.

## 2018-10-14 NOTE — ED Provider Notes (Signed)
[facility] Emergency Department Provider Note  ____________________________________________   First MD Initiated Contact with Patient 10/14/18 0532     (approximate)  I have reviewed the triage vital signs and the nursing notes.   HISTORY  Chief Complaint Emesis and Abdominal Pain   HPI Amy Logan is a 47 y.o. female who self presents to the emergency department with sudden onset severe nausea and vomiting that began acutely at 10:30 PM last night 7 hours prior to arrival.  She says she did feel generally run down throughout the last day.  No fevers or chills.  She does have multiple previous abdominal surgeries including cholecystectomy, appendectomy, and hysterectomy.  About a week ago she had a lipoma excised from her right lower abdominal wall.  She denies chest pain or shortness of breath.  She denies dysuria frequency or hesitancy.  She denies back pain.  Her symptoms came on suddenly were severe and nothing seems to make them better or worse.  She does feel thirsty and dehydrated.    Past Medical History:  Diagnosis Date  . Anemia   . Anxiety   . Arthritis   . Bradycardia   . Cancer ([place]) 1990   cervical cells stage 4  . Fibromyalgia   . History of hiatal hernia    SMALL  . History of kidney stones    H/O  . Hypertension   . Neuropathy     Patient Active Problem List   Diagnosis Date Noted  . SIRS (systemic inflammatory response syndrome) ([place]) 10/14/2018  . Epidermal inclusion cyst   . Mass of soft tissue of abdomen 09/30/2018    Past Surgical History:  Procedure Laterality Date  . ABDOMINAL HYSTERECTOMY    . APPENDECTOMY    . BREAST CYST ASPIRATION Left    neg  . CHOLECYSTECTOMY    . COLONOSCOPY  2018  . cyst removed     thyroglossal duct cyst  . JOINT REPLACEMENT    . LIPOMA EXCISION Right 10/09/2018   Procedure: EXCISION LIPOMA- ABDOMINAL;  Surgeon: Fredirick Maudlin, MD;  Location: [facility] ORS;  Service: General;  Laterality:  Right;  . REPLACEMENT TOTAL KNEE     Right knee    Prior to Admission medications   Medication Sig Start Date End Date Taking? Authorizing Provider  bacitracin 500 UNIT/GM ointment Apply 1 application topically as needed for wound care.    [provider]  cetirizine (ZYRTEC) 10 MG tablet Take 10 mg by mouth daily as needed for allergies.    [provider]  Cyanocobalamin (B-12 COMPLIANCE INJECTION IJ) Inject 1 Dose as directed every 30 (thirty) days.    [provider]  DULoxetine (CYMBALTA) 60 MG capsule Take 60 mg by mouth at bedtime.    [provider]  lisinopril-hydrochlorothiazide (PRINZIDE,ZESTORETIC) 20-25 MG tablet Take 1 tablet by mouth every morning.    [provider]  naproxen sodium (ALEVE) 220 MG tablet Take 220 mg by mouth 2 (two) times daily.    [provider]  traMADol (ULTRAM) 50 MG tablet Take 1 tablet (50 mg total) by mouth every 8 (eight) hours as needed. 10/09/18   Fredirick Maudlin, MD  Vitamin D, Ergocalciferol, (DRISDOL) 1.25 MG (50000 UT) CAPS capsule Take 50,000 Units by mouth every Friday.  08/27/18   [provider]    Allergies Onion; Coconut flavor; Demerol [meperidine]; Morphine and related; Phenergan [promethazine hcl]; Stadol [butorphanol]; Zofran [ondansetron hcl]; and Imitrex [sumatriptan]  Family History  Problem Relation Age  of Onset  . Diabetes Mother   . Heart failure Mother   . Asthma Mother   . Cancer Father   . Breast cancer Cousin 74       mat cousin    Social History Social History   Tobacco Use  . Smoking status: Current Every Day Smoker    Packs/day: 0.50    Years: 25.00    Pack years: 12.50    Types: Cigarettes  . Smokeless tobacco: Never Used  Substance Use Topics  . Alcohol use: Yes    Alcohol/week: 0.0 standard drinks    Comment: social  . Drug use: No    Review of Systems Constitutional: No fever/chills Eyes: No visual changes. ENT: No sore  throat. Cardiovascular: Denies chest pain. Respiratory: Denies shortness of breath. Gastrointestinal: Positive for abdominal pain.  Positive for nausea, positive for vomiting.  No diarrhea.  No constipation. Genitourinary: Negative for dysuria. Musculoskeletal: Negative for back pain. Skin: Negative for rash. Neurological: Negative for headaches, focal weakness or numbness.   ____________________________________________   PHYSICAL EXAM:  VITAL SIGNS: ED Triage Vitals [10/14/18 0527]  Enc Vitals Group     BP 130/81     Pulse Rate 98     Resp 18     Temp 97.8 F (36.6 C)     Temp Source Oral     SpO2 99 %     Weight 204 lb (92.5 kg)     Height 5\' 6"  (1.676 m)     Head Circumference      Peak Flow      Pain Score 4     Pain Loc      Pain Edu?      Excl. in [place]?     Constitutional: Alert and oriented x4 appears quite uncomfortable with cool clammy skin Eyes: PERRL EOMI. Head: Atraumatic. Nose: No congestion/rhinnorhea. Mouth/Throat: No trismus Neck: No stridor.   Cardiovascular: Normal rate, regular rhythm. Grossly normal heart sounds.  Respiratory: Normal respiratory effort.  No retractions. Lungs CTAB and moving good air Gastrointestinal: Obese soft abdomen nondistended mild diffuse tenderness with no rebound or guarding no peritonitis Musculoskeletal: No lower extremity edema   Neurologic:  Normal speech and language. No gross focal neurologic deficits are appreciated. Skin: 8 cm surgical scar in right lower quadrant with no erythema warmth or tenderness. Skin is cool and clammy Psychiatric: Mood and affect are normal. Speech and behavior are normal.    ____________________________________________   DIFFERENTIAL includes but not limited to  Sepsis, dehydration, viral syndrome, influenza, urinary tract infection, pyelonephritis, bowel obstruction ____________________________________________   LABS (all labs ordered are listed, but only abnormal results are  displayed)  Labs Reviewed  COMPREHENSIVE METABOLIC PANEL - Abnormal; Notable for the following components:      Result Value   CO2 20 (*)    Glucose, Bld 128 (*)    BUN 21 (*)    Calcium 8.8 (*)    All other components within normal limits  CBC WITH DIFFERENTIAL/PLATELET - Abnormal; Notable for the following components:   WBC 35.9 (*)    Hemoglobin 15.5 (*)    HCT 47.2 (*)    Neutro Abs 33.2 (*)    Monocytes Absolute 1.2 (*)    Abs Immature Granulocytes 0.36 (*)    All other components within normal limits  CULTURE, BLOOD (ROUTINE X 2)  CULTURE, BLOOD (ROUTINE X 2)  LIPASE, BLOOD  URINALYSIS, COMPLETE (UACMP) WITH MICROSCOPIC  PROCALCITONIN  FERRITIN  INFLUENZA PANEL BY PCR (  TYPE A & B)  I-STAT CG4 LACTIC ACID, ED  CG4 I-STAT (LACTIC ACID)    Lab work reviewed by me with a white count of 36, left shift, and vacuolated neutrophils concerning for severe sepsis __________________________________________  EKG  ED ECG REPORT I, Darel Hong, the attending physician, personally viewed and interpreted this ECG.  Date: 10/14/2018 EKG Time:  Rate: 97 Rhythm: normal sinus rhythm QRS Axis: normal Intervals: normal ST/T Wave abnormalities: normal Narrative Interpretation: no evidence of acute ischemia  ____________________________________________  RADIOLOGY  CT abdomen pelvis reviewed by me concerning for possible gastritis otherwise unremarkable ____________________________________________   PROCEDURES  Procedure(s) performed: no  .Critical Care Performed by: Darel Hong, MD Authorized by: Darel Hong, MD   Critical care provider statement:    Critical care time (minutes):  30   Critical care time was exclusive of:  Separately billable procedures and treating other patients   Critical care was necessary to treat or prevent imminent or life-threatening deterioration of the following conditions:  Sepsis   Critical care was time spent personally by me on  the following activities:  Development of treatment plan with patient or surrogate, discussions with consultants, evaluation of patient's response to treatment, examination of patient, obtaining history from patient or surrogate, ordering and performing treatments and interventions, ordering and review of laboratory studies, ordering and review of radiographic studies, pulse oximetry, re-evaluation of patient's condition and review of old charts    Critical Care performed: Yes  ____________________________________________   INITIAL IMPRESSION / ASSESSMENT AND PLAN / ED COURSE  Pertinent labs & imaging results that were available during my care of the patient were reviewed by me and considered in my medical decision making (see chart for details).   As part of my medical decision making, I reviewed the following data within the [place] History obtained from family if available, nursing notes, old chart and ekg, as well as notes from prior ED visits.  Arrival the patient is very uncomfortable appearing with pale cool clammy skin and actively retching and vomiting.  Broad labs including lactic acid sent off now.  She has multiple allergies to antiemetics we will give her 5 mg of IV haloperidol for the nausea along with a liter of fluids.  She will require CT scan with IV contrast.     ----------------------------------------- 7:07 AM on 10/14/2018 -----------------------------------------  The patient CT scan came back reassuring with no clear etiology of her symptoms.  Does show perhaps some gastritis however more concerning is her white count of 36 along with a significant left shift and vacuolated neutrophils.  She was diaphoretic on arrival.  I appreciate that her lactic acid was normal however I do have concern for bacteremia and sepsis.  I am adding on blood cultures along with another liter of fluid and broad-spectrum antibiotics will require inpatient admission for  further work-up and management of her occult sepsis.  She is SIRS positive.  I discussed with the hospitalist Dr. Manuella Ghazi who has graciously agreed to admit the patient to his service. ____________________________________________   FINAL CLINICAL IMPRESSION(S) / ED DIAGNOSES  Final diagnoses:  Sepsis, due to unspecified organism, unspecified whether acute organ dysfunction present [facility])      NEW MEDICATIONS STARTED DURING THIS VISIT:  New Prescriptions   No medications on file     Note:  This document was prepared using Dragon voice recognition software and may include unintentional dictation errors.     Darel Hong, MD 10/14/18 [phone]

## 2018-10-14 NOTE — ED Notes (Signed)
Pt with right sided abd lipoma removed this week. Pt states she began to experience nausea and vomiting with abd pain last pm at 2200. Pt with dry heaves in bag on arrival, pale, slightly sweaty.

## 2018-10-14 NOTE — ED Notes (Signed)
Report to brooke, rn

## 2018-10-14 NOTE — ED Notes (Signed)
Bed adjusted multiple times for comfort. Warm blanket given.

## 2018-10-14 NOTE — H&P (Signed)
[facility] [place] NAME: Amy Logan    MR#:  [id number]  DATE OF BIRTH:  Dec 26, 1970  DATE OF ADMISSION:  10/14/2018  PRIMARY CARE PHYSICIAN: Dayton Martes [name], NP   REQUESTING/REFERRING PHYSICIAN: Darel Hong, MD  CHIEF COMPLAINT:   Chief Complaint  Patient presents with  . Emesis  . Abdominal Pain    HISTORY OF PRESENT ILLNESS:  Amy Logan  is a 47 y.o. female with a known history of Anxiety, cervical CA and recent Giant sebaceous cyst (epidermal inclusion cyst) on 10/09/2018 is being admitted for suspected sepsis/bacteremia. She came in to ED for sudden onset severe nausea and vomiting that began acutely at 10:30 PM last night 7 hours prior to arrival.  She says she did feel generally run down throughout the last day. She was very diaphoretic and clammy in ED. Considering severe leukocytosis and presenting symptoms EDP is concerned for bacteremia and given broad spectrum Abx and requested admission. She does have multiple previous abdominal surgeries including cholecystectomy, appendectomy, and hysterectomy. Her symptoms came on suddenly were severe and nothing seems to make them better or worse.  She does feel thirsty and dehydrated. PAST MEDICAL HISTORY:   Past Medical History:  Diagnosis Date  . Anemia   . Anxiety   . Arthritis   . Bradycardia   . Cancer ([place]) 1990   cervical cells stage 4  . Fibromyalgia   . History of hiatal hernia    SMALL  . History of kidney stones    H/O  . Hypertension   . Neuropathy     PAST SURGICAL HISTORY:   Past Surgical History:  Procedure Laterality Date  . ABDOMINAL HYSTERECTOMY    . APPENDECTOMY    . BREAST CYST ASPIRATION Left    neg  . CHOLECYSTECTOMY    . COLONOSCOPY  2018  . cyst removed     thyroglossal duct cyst  . JOINT REPLACEMENT    . LIPOMA EXCISION Right 10/09/2018   Procedure: EXCISION LIPOMA- ABDOMINAL;  Surgeon: Fredirick Maudlin, MD;  Location: [facility]  ORS;  Service: General;  Laterality: Right;  . REPLACEMENT TOTAL KNEE     Right knee    SOCIAL HISTORY:   Social History   Tobacco Use  . Smoking status: Current Every Day Smoker    Packs/day: 0.50    Years: 25.00    Pack years: 12.50    Types: Cigarettes  . Smokeless tobacco: Never Used  Substance Use Topics  . Alcohol use: Yes    Alcohol/week: 0.0 standard drinks    Comment: social    FAMILY HISTORY:   Family History  Problem Relation Age of Onset  . Diabetes Mother   . Heart failure Mother   . Asthma Mother   . Cancer Father   . Breast cancer Cousin 71       mat cousin    DRUG ALLERGIES:   Allergies  Allergen Reactions  . Onion Anaphylaxis  . Coconut Flavor Hives  . Demerol [Meperidine] Hives    agitated   . Morphine And Related Hives    Agitated   . Phenergan [Promethazine Hcl]     Tremors/ spasms   . Stadol [Butorphanol] Hives    Increases BP  . Zofran [Ondansetron Hcl] Hives  . Imitrex [Sumatriptan] Palpitations    REVIEW OF SYSTEMS:   Review of Systems  Constitutional: Negative for chills, fever and weight loss.  HENT: Negative for nosebleeds and sore throat.  Eyes: Negative for blurred vision.  Respiratory: Negative for cough, shortness of breath and wheezing.   Cardiovascular: Negative for chest pain, orthopnea, leg swelling and PND.  Gastrointestinal: Positive for nausea and vomiting. Negative for abdominal pain, constipation, diarrhea and heartburn.  Genitourinary: Negative for dysuria and urgency.  Musculoskeletal: Negative for back pain.  Skin: Negative for rash.  Neurological: Negative for dizziness, speech change, focal weakness and headaches.  Endo/Heme/Allergies: Does not bruise/bleed easily.  Psychiatric/Behavioral: Negative for depression.   MEDICATIONS AT HOME:   Prior to Admission medications   Medication Sig Start Date End Date Taking? Authorizing Provider  bacitracin 500 UNIT/GM ointment Apply 1 application topically as  needed for wound care.    [provider]  cetirizine (ZYRTEC) 10 MG tablet Take 10 mg by mouth daily as needed for allergies.    [provider]  Cyanocobalamin (B-12 COMPLIANCE INJECTION IJ) Inject 1 Dose as directed every 30 (thirty) days.    [provider]  DULoxetine (CYMBALTA) 60 MG capsule Take 60 mg by mouth at bedtime.    [provider]  lisinopril-hydrochlorothiazide (PRINZIDE,ZESTORETIC) 20-25 MG tablet Take 1 tablet by mouth every morning.    [provider]  naproxen sodium (ALEVE) 220 MG tablet Take 220 mg by mouth 2 (two) times daily.    [provider]  traMADol (ULTRAM) 50 MG tablet Take 1 tablet (50 mg total) by mouth every 8 (eight) hours as needed. 10/09/18   Fredirick Maudlin, MD  Vitamin D, Ergocalciferol, (DRISDOL) 1.25 MG (50000 UT) CAPS capsule Take 50,000 Units by mouth every Friday.  08/27/18   [provider]      VITAL SIGNS:  Blood pressure 106/60, pulse 96, temperature 97.8 F (36.6 C), temperature source Oral, resp. rate (!) 23, height 5\' 6"  (1.676 m), weight 92.5 kg, SpO2 97 %.  PHYSICAL EXAMINATION:  Physical Exam  GENERAL:  47 y.o.-year-old patient lying in the bed with no acute distress.  EYES: Pupils equal, round, reactive to light and accommodation. No scleral icterus. Extraocular muscles intact.  HEENT: Head atraumatic, normocephalic. Oropharynx and nasopharynx clear.  NECK:  Supple, no jugular venous distention. No thyroid enlargement, no tenderness.  LUNGS: Normal breath sounds bilaterally, no wheezing, rales,rhonchi or crepitation. No use of accessory muscles of respiration.  CARDIOVASCULAR: S1, S2 normal. No murmurs, rubs, or gallops.  ABDOMEN: Soft, nontender, nondistended. Bowel sounds present. No organomegaly or mass.  EXTREMITIES: No pedal edema, cyanosis, or clubbing.  NEUROLOGIC: Cranial nerves II through XII are intact. Muscle strength 5/5 in all extremities. Sensation intact.  Gait not checked.  PSYCHIATRIC: The patient is alert and oriented x 3.  SKIN: 8 cm surgical scar in right lower quadrant with no erythema warmth or tenderness. Skin is cool and clammy LABORATORY PANEL:   CBC Recent Labs  Lab 10/14/18 0531  WBC 35.9*  HGB 15.5*  HCT 47.2*  PLT 323   ------------------------------------------------------------------------------------------------------------------  Chemistries  Recent Labs  Lab 10/14/18 0531  NA 136  K 4.1  CL 106  CO2 20*  GLUCOSE 128*  BUN 21*  CREATININE 0.69  CALCIUM 8.8*  AST 15  ALT 15  ALKPHOS 54  BILITOT 0.6   ------------------------------------------------------------------------------------------------------------------  Cardiac Enzymes No results for input(s): TROPONINI in the last 168 hours. ------------------------------------------------------------------------------------------------------------------  RADIOLOGY:  Ct Abdomen Pelvis W Contrast  Result Date: 10/14/2018 CLINICAL DATA:  Abdominal infection. Right-sided abdominal AP Oma removed this week. Now with nausea and vomiting. EXAM: CT ABDOMEN AND PELVIS WITH CONTRAST TECHNIQUE: Multidetector CT  imaging of the abdomen and pelvis was performed using the standard protocol following bolus administration of intravenous contrast. CONTRAST:  174mL ISOVUE-300 IOPAMIDOL (ISOVUE-300) INJECTION 61% COMPARISON:  None. FINDINGS: Lower chest: No pleural fluid or consolidation. Small hiatal hernia. Hepatobiliary: No focal hepatic abnormality. Elongated right lobe of the liver spanning 22.9 cm. Postcholecystectomy with mild postsurgical dilatation of the biliary tree. No visualized choledocholithiasis. Pancreas: No ductal dilatation or inflammation. Spleen: Normal in size without focal abnormality. Adrenals/Urinary Tract: Normal adrenal glands. No hydronephrosis or perinephric edema. Homogeneous renal enhancement with symmetric excretion on delayed phase imaging. Urinary  bladder is partially distended without wall thickening. Stomach/Bowel: Small hiatal hernia. Possible pre-pyloric gastric wall thickening versus nondistention, lack of enteric contrast limits detailed assessment. No small bowel dilatation, inflammation or obstruction. Appendix not visualized, post appendectomy per history. Fluid/liquid stool in the cecum and ascending colon without colonic wall thickening or inflammatory change. Formed stool in the more distal colon. Diverticulosis from the splenic flexure distally, prominent in the distal descending and sigmoid. No diverticulitis. Vascular/Lymphatic: Vascular structures are unremarkable. No enlarged lymph nodes in the abdomen or pelvis. Reproductive: Post hysterectomy. Surgical clips in the right adnexa, right ovary tentatively identified and normal. Left ovary appears quiescent. No suspicious adnexal mass. Other: Skin thickening with mild subcutaneous edema about the right lateral abdominal wall at the level of the the lower liver. Small amount of simple fluid in the subcutaneous tissues likely small postoperative seroma. No peripherally enhancing fluid collection or subcutaneous air. No abdominopelvic ascites. Small fat containing umbilical hernia. Musculoskeletal: There are no acute or suspicious osseous abnormalities. Prominent facet hypertrophy in the lower lumbar spine. IMPRESSION: 1. Post recent right abdominal wall lipoma excision with skin thickening and small amount of edema and free fluid at the operative bed. This is likely normal postoperative, no organized fluid collection or soft tissue air. 2. Equivocal pre pyloric gastric wall thickening which may represent gastritis versus nondistention. 3. Colonic diverticulosis without diverticulitis. Electronically Signed   By: Keith Rake M.D.   On: 10/14/2018 06:48   IMPRESSION AND PLAN:  52 y f with suspected sepsis of unknown etio  * Suspected Sepsis - severe leukocytosis, somewhat tachycardic,  borderline hypotensive. - unclear source - will get ID c/s  * HTN - with borderline BP, will hold off BP meds. Monitor  * Fibromyalgia - continue cymbalta and tramadol prn  * intractable N/V - Phenergan PRN    All the records are reviewed and case discussed with ED provider. Management plans discussed with the patient, family (husband at bedside) and they are in agreement.  CODE STATUS: FULL CODE  TOTAL TIME TAKING CARE OF THIS PATIENT: 45 minutes.    Max Sane M.D on 10/14/2018 at 7:36 AM  Between 7am to 6pm - Pager - [phone]  After 6pm go to [email or web address] - Proofreader  [facility]  Office  [phone]  CC: Primary care physician; Sallee Lange, NP   Note: This dictation was prepared with Dragon dictation along with smaller phrase technology. Any transcriptional errors that result from this process are unintentional.

## 2018-10-15 LAB — BASIC METABOLIC PANEL
ANION GAP: 10 (ref 5–15)
BUN: 9 mg/dL (ref 6–20)
CHLORIDE: 105 mmol/L (ref 98–111)
CO2: 25 mmol/L (ref 22–32)
CREATININE: 0.68 mg/dL (ref 0.44–1.00)
Calcium: 8 mg/dL — ABNORMAL LOW (ref 8.9–10.3)
GFR calc non Af Amer: >60 mL/min (ref >60)
Glucose, Bld: 105 mg/dL — ABNORMAL HIGH (ref 70–99)
Potassium: 3.4 mmol/L — ABNORMAL LOW (ref 3.5–5.1)
Sodium: 140 mmol/L (ref 135–145)

## 2018-10-15 LAB — CBC
HCT: 38.8 % (ref 36.0–46.0)
Hemoglobin: 12.8 g/dL (ref 12.0–15.0)
MCH: 32.7 pg (ref 26.0–34.0)
MCHC: 33 g/dL (ref 30.0–36.0)
MCV: 99 fL (ref 80.0–100.0)
NRBC: 0 % (ref 0.0–0.2)
PLATELETS: 269 10*3/uL (ref 150–400)
RBC: 3.92 MIL/uL (ref 3.87–5.11)
RDW: 13.6 % (ref 11.5–15.5)
WBC: 11.4 10*3/uL — AB (ref 4.0–10.5)

## 2018-10-15 LAB — HIV ANTIBODY (ROUTINE TESTING W REFLEX): HIV SCREEN 4TH GENERATION: NONREACTIVE

## 2018-10-15 NOTE — Discharge Instructions (Signed)
Nausea and Vomiting, Adult Feeling sick to your stomach (nausea) means that your stomach is upset or you feel like you have to throw up (vomit). Feeling more and more sick to your stomach can lead to throwing up. Throwing up happens when food and liquid from your stomach are thrown up and out the mouth. Throwing up can make you feel weak and cause you to get dehydrated. Dehydration can make you tired and thirsty, make you have a dry mouth, and make it so you pee (urinate) less often. Older adults and people with other diseases or a weak defense system (immune system) are at higher risk for dehydration. If you feel sick to your stomach or if you throw up, it is important to follow instructions from your doctor about how to take care of yourself. Follow these instructions at home: Eating and drinking Follow these instructions as told by your doctor:  Take an oral rehydration solution (ORS). This is a drink that is sold at pharmacies and stores.  Drink clear fluids in small amounts as you are able, such as: ? Water. ? Ice chips. ? Diluted fruit juice. ? Low-calorie sports drinks.  Eat bland, easy-to-digest foods in small amounts as you are able, such as: ? Bananas. ? Applesauce. ? Rice. ? Low-fat (lean) meats. ? Toast. ? Crackers.  Avoid fluids that have a lot of sugar or caffeine in them.  Avoid alcohol.  Avoid spicy or fatty foods.  General instructions  Drink enough fluid to keep your pee (urine) clear or pale yellow.  Wash your hands often. If you cannot use soap and water, use hand sanitizer.  Make sure that all people in your home wash their hands well and often.  Take over-the-counter and prescription medicines only as told by your doctor.  Rest at home while you get better.  Watch your condition for any changes.  Breathe slowly and deeply when you feel sick to your stomach.  Keep all follow-up visits as told by your doctor. This is important. Contact a doctor  if:  You have a fever.  You cannot keep fluids down.  Your symptoms get worse.  You have new symptoms.  You feel sick to your stomach for more than two days.  You feel light-headed or dizzy.  You have a headache.  You have muscle cramps. Get help right away if:  You have pain in your chest, neck, arm, or jaw.  You feel very weak or you pass out (faint).  You throw up again and again.  You see blood in your throw-up.  Your throw-up looks like black coffee grounds.  You have bloody or black poop (stools) or poop that look like tar.  You have a very bad headache, a stiff neck, or both.  You have a rash.  You have very bad pain, cramping, or bloating in your belly (abdomen).  You have trouble breathing.  You are breathing very quickly.  Your heart is beating very quickly.  Your skin feels cold and clammy.  You feel confused.  You have pain when you pee.  You have signs of dehydration, such as: ? Dark pee, hardly any pee, or no pee. ? Cracked lips. ? Dry mouth. ? Sunken eyes. ? Sleepiness. ? Weakness. These symptoms may be an emergency. Do not wait to see if the symptoms will go away. Get medical help right away. Call your local emergency services (911 in the [place] Do not drive yourself to the hospital. This information is   not intended to replace advice given to you by your health care provider. Make sure you discuss any questions you have with your health care provider. Document Released: 04/24/2008 Document Revised: 05/26/2016 Document Reviewed: 07/13/2015 Elsevier Interactive Patient Education  2018 Elsevier Inc.  

## 2018-10-15 NOTE — Plan of Care (Signed)

## 2018-10-16 NOTE — Discharge Summary (Signed)
[facility] [place] NAME: Amy Logan    MR#:  [id number]  DATE OF BIRTH:  1970-11-30  DATE OF ADMISSION:  10/14/2018   ADMITTING PHYSICIAN: Max Sane, MD  DATE OF DISCHARGE: 10/15/2018  8:35 AM  PRIMARY CARE PHYSICIAN: Sallee Lange, NP   ADMISSION DIAGNOSIS:  Sepsis, due to unspecified organism, unspecified whether acute organ dysfunction present ([place]) [A41.9] DISCHARGE DIAGNOSIS:  Active Problems:   SIRS (systemic inflammatory response syndrome) ([place])  SECONDARY DIAGNOSIS:   Past Medical History:  Diagnosis Date  . Anemia   . Anxiety   . Arthritis   . Bradycardia   . Cancer ([place]) 1990   cervical cells stage 4  . Fibromyalgia   . History of hiatal hernia    SMALL  . History of kidney stones    H/O  . Hypertension   . Neuropathy    HOSPITAL COURSE:  47 y.o.female  with a history of RT TKA, HTN recent surgery for abdominal wall lesion on 10/09/18 which was an  epidermoid cyst is admitted with sudden onset vomiting and feeling hot and cold  * Sepsis - Ruled out  * HTN - controlled  * Fibromyalgia - continue cymbalta and tramadol prn  * Sudden onset of Vomiting - with feeling hot and cold - now resolved and tolerating diet.   * Leukocytosis- she has baseline leucocytosis aorund 16, now very high- component of dehydration. Does not look septic. No surgical site infection as always concern for strep infection post surgery. But site looks fine - consider outpt eval if her baseline leucocytosis persists  * Recent anterior abdominal wall epidermoid cyst removal - f/up with surgery as scheduled DISCHARGE CONDITIONS:  stable CONSULTS OBTAINED:  Treatment Team:  Tsosie Billing, MD DRUG ALLERGIES:   Allergies  Allergen Reactions  . Onion Anaphylaxis  . Coconut Flavor Hives  . Demerol [Meperidine] Hives    agitated   . Morphine And Related Hives    Agitated   . Phenergan [Promethazine  Hcl]     Tremors/ spasms   . Stadol [Butorphanol] Hives    Increases BP  . Zofran [Ondansetron Hcl] Hives  . Imitrex [Sumatriptan] Palpitations   DISCHARGE MEDICATIONS:   Allergies as of 10/15/2018      Reactions   Onion Anaphylaxis   Coconut Flavor Hives   Demerol [meperidine] Hives   agitated    Morphine And Related Hives   Agitated    Phenergan [promethazine Hcl]    Tremors/ spasms    Stadol [butorphanol] Hives   Increases BP   Zofran [ondansetron Hcl] Hives   Imitrex [sumatriptan] Palpitations      Medication List    TAKE these medications   B-12 COMPLIANCE INJECTION IJ Inject 1 Dose as directed every 30 (thirty) days.   bacitracin 500 UNIT/GM ointment Apply 1 application topically as needed for wound care.   cetirizine 10 MG tablet Commonly known as:  ZYRTEC Take 10 mg by mouth daily as needed for allergies.   DULoxetine 60 MG capsule Commonly known as:  CYMBALTA Take 60 mg by mouth at bedtime.   lisinopril-hydrochlorothiazide 20-25 MG tablet Commonly known as:  PRINZIDE,ZESTORETIC Take 1 tablet by mouth every morning.   naproxen sodium 220 MG tablet Commonly known as:  ALEVE Take 220 mg by mouth 2 (two) times daily.   traMADol 50 MG tablet Commonly known as:  ULTRAM Take 1 tablet (50 mg total) by mouth every 8 (eight) hours as  needed.   Vitamin D (Ergocalciferol) 1.25 MG (50000 UT) Caps capsule Commonly known as:  DRISDOL Take 50,000 Units by mouth every Friday.        DISCHARGE INSTRUCTIONS:   DIET:  Regular diet DISCHARGE CONDITION:  Good ACTIVITY:  Activity as tolerated OXYGEN:  Home Oxygen: No.  Oxygen Delivery: room air DISCHARGE LOCATION:  home   If you experience worsening of your admission symptoms, develop shortness of breath, life threatening emergency, suicidal or homicidal thoughts you must seek medical attention immediately by calling 911 or calling your MD immediately  if symptoms less severe.  You Must read complete  instructions/literature along with all the possible adverse reactions/side effects for all the Medicines you take and that have been prescribed to you. Take any new Medicines after you have completely understood and accpet all the possible adverse reactions/side effects.   Please note  You were cared for by a hospitalist during your hospital stay. If you have any questions about your discharge medications or the care you received while you were in the hospital after you are discharged, you can call the unit and asked to speak with the hospitalist on call if the hospitalist that took care of you is not available. Once you are discharged, your primary care physician will handle any further medical issues. Please note that NO REFILLS for any discharge medications will be authorized once you are discharged, as it is imperative that you return to your primary care physician (or establish a relationship with a primary care physician if you do not have one) for your aftercare needs so that they can reassess your need for medications and monitor your lab values.    On the day of Discharge:  VITAL SIGNS:  Blood pressure (!) 106/55, pulse 68, temperature 98.4 F (36.9 C), temperature source Oral, resp. rate 20, height 5\' 6"  (1.676 m), weight 92.5 kg, SpO2 97 %. PHYSICAL EXAMINATION:  GENERAL:  47 y.o.-year-old patient lying in the bed with no acute distress.  EYES: Pupils equal, round, reactive to light and accommodation. No scleral icterus. Extraocular muscles intact.  HEENT: Head atraumatic, normocephalic. Oropharynx and nasopharynx clear.  NECK:  Supple, no jugular venous distention. No thyroid enlargement, no tenderness.  LUNGS: Normal breath sounds bilaterally, no wheezing, rales,rhonchi or crepitation. No use of accessory muscles of respiration.  CARDIOVASCULAR: S1, S2 normal. No murmurs, rubs, or gallops.  ABDOMEN: Soft, non-tender, non-distended. Bowel sounds present. No organomegaly or mass.    EXTREMITIES: No pedal edema, cyanosis, or clubbing.  NEUROLOGIC: Cranial nerves II through XII are intact. Muscle strength 5/5 in all extremities. Sensation intact. Gait not checked.  PSYCHIATRIC: The patient is alert and oriented x 3.  SKIN: No obvious rash, lesion, or ulcer.  DATA REVIEW:   CBC Recent Labs  Lab 10/15/18 0426  WBC 11.4*  HGB 12.8  HCT 38.8  PLT 269    Chemistries  Recent Labs  Lab 10/14/18 0531 10/15/18 0426  NA 136 140  K 4.1 3.4*  CL 106 105  CO2 20* 25  GLUCOSE 128* 105*  BUN 21* 9  CREATININE 0.69 0.68  CALCIUM 8.8* 8.0*  AST 15  --   ALT 15  --   ALKPHOS 54  --   BILITOT 0.6  --      Follow-up Information    [name], [name], NP. Schedule an appointment as soon as possible for a visit in 5 day(s).   Specialty:  Internal Medicine Contact information: [address]  [place] [address] [phone]        Fredirick Maudlin, MD. Go on 10/24/2018.   Specialty:  General Surgery Why:  @ 9:00 a.m. Contact information: [address] [place] [address] [phone]            Management plans discussed with the patient, family and they are in agreement.  CODE STATUS: Prior   TOTAL TIME TAKING CARE OF THIS PATIENT: 45 minutes.    Max Sane M.D on 10/16/2018 at 11:14 AM  Between 7am to 6pm - Pager - [phone]  After 6pm go to [email or web address] - Proofreader  [facility]  Office  [phone]  CC: Primary care physician; Sallee Lange, NP   Note: This dictation was prepared with Dragon dictation along with smaller phrase technology. Any transcriptional errors that result from this process are unintentional.

## 2018-10-19 LAB — CULTURE, BLOOD (ROUTINE X 2)
Culture: NO GROWTH
Culture: NO GROWTH
Special Requests: ADEQUATE

## 2018-10-24 ENCOUNTER — Ambulatory Visit (INDEPENDENT_AMBULATORY_CARE_PROVIDER_SITE_OTHER): Payer: 59 | Admitting: General Surgery

## 2018-10-24 ENCOUNTER — Encounter: Payer: Self-pay | Admitting: General Surgery

## 2018-10-24 ENCOUNTER — Other Ambulatory Visit: Payer: Self-pay

## 2018-10-24 VITALS — BP 133/97 | HR 64 | Temp 96.6°F | Resp 18 | Ht 66.0 in | Wt 213.5 lb

## 2018-10-24 DIAGNOSIS — Z9889 Other specified postprocedural states: Secondary | ICD-10-CM

## 2018-10-24 DIAGNOSIS — F329 Major depressive disorder, single episode, unspecified: Secondary | ICD-10-CM | POA: Insufficient documentation

## 2018-10-24 DIAGNOSIS — F5104 Psychophysiologic insomnia: Secondary | ICD-10-CM | POA: Insufficient documentation

## 2018-10-24 DIAGNOSIS — F419 Anxiety disorder, unspecified: Secondary | ICD-10-CM

## 2018-10-24 DIAGNOSIS — I10 Essential (primary) hypertension: Secondary | ICD-10-CM | POA: Insufficient documentation

## 2018-10-24 DIAGNOSIS — F1190 Opioid use, unspecified, uncomplicated: Secondary | ICD-10-CM | POA: Insufficient documentation

## 2018-10-24 DIAGNOSIS — Z872 Personal history of diseases of the skin and subcutaneous tissue: Secondary | ICD-10-CM

## 2018-10-24 DIAGNOSIS — E669 Obesity, unspecified: Secondary | ICD-10-CM | POA: Insufficient documentation

## 2018-10-24 DIAGNOSIS — G629 Polyneuropathy, unspecified: Secondary | ICD-10-CM | POA: Insufficient documentation

## 2018-10-24 DIAGNOSIS — M1990 Unspecified osteoarthritis, unspecified site: Secondary | ICD-10-CM | POA: Insufficient documentation

## 2018-10-24 DIAGNOSIS — F32A Depression, unspecified: Secondary | ICD-10-CM | POA: Insufficient documentation

## 2018-10-24 DIAGNOSIS — E785 Hyperlipidemia, unspecified: Secondary | ICD-10-CM | POA: Insufficient documentation

## 2018-10-24 NOTE — Patient Instructions (Signed)
Please call our office with any questions or concerns. 

## 2018-10-24 NOTE — Progress Notes (Signed)
Amy Logan is here today for a postoperative visit after undergoing excision of a large abdominal epidermal inclusion cyst.  Her operation was performed on 20 November.  Pathology confirmed the diagnosis of epidermal inclusion cyst.  She presented to the emergency department about 5 days later with intractable nausea and vomiting.  She was diagnosed with sepsis and admitted to the hospital.  There was no concerned that her surgical site was at all contributing to this and the reason for her illness was never truly discovered.  She has recovered well and returned to work.  She notes some ongoing tenderness in the area of her operation, however she has not noticed any erythema induration or drainage.  She did state that she has developed a vaginal yeast infection that she believes is secondary to the antibiotics she received when admitted to the hospital.  Vitals:   10/24/18 0933  BP: (!) 133/97  Pulse: 64  Resp: 18  Temp: (!) 96.6 F (35.9 C)  SpO2: 98%   Focused abdominal exam: surgical incision well-approximated with a small bit of scabbing at the mid point. No erythema, induration, or drainage.  A/P: Doing well post-op from excision of a large epidermal inclusion cyst.  There does not appear to have been any relationship between her operation and her later admission to the hospital.  She is healing well and may resume her usual activities without restriction.  I advised her to contact her PCP for fluconazole to manage her yeast infection. RTC PRN.

## 2018-12-16 ENCOUNTER — Other Ambulatory Visit: Payer: Self-pay

## 2018-12-16 ENCOUNTER — Ambulatory Visit (INDEPENDENT_AMBULATORY_CARE_PROVIDER_SITE_OTHER): Payer: 59

## 2018-12-16 ENCOUNTER — Ambulatory Visit
Admission: EM | Admit: 2018-12-16 | Discharge: 2018-12-16 | Disposition: A | Discharge status: Home / Self Care | Payer: 59 | Attending: Family Medicine | Admitting: Family Medicine

## 2018-12-16 DIAGNOSIS — W548XXA Other contact with dog, initial encounter: Secondary | ICD-10-CM

## 2018-12-16 DIAGNOSIS — M79671 Pain in right foot: Secondary | ICD-10-CM

## 2018-12-16 DIAGNOSIS — M25571 Pain in right ankle and joints of right foot: Secondary | ICD-10-CM | POA: Insufficient documentation

## 2018-12-16 NOTE — Discharge Instructions (Addendum)
Take home medications as prescribed.  Wear boot.  Rest.  Apply ice.  Follow-up with podiatry this week.  Return to urgent care as needed.

## 2018-12-16 NOTE — ED Triage Notes (Signed)
Patient states that 2 days ago and she was taking her dog out. States that the ground was wet and her foot was planted. States that ankle turned and popped. States that area has been very painful. Patient has bruising on ankle.

## 2018-12-16 NOTE — ED Provider Notes (Signed)
[facility] ____________________________________________  Time seen: Approximately 11:03 AM  I have reviewed the triage vital signs and the nursing notes.   HISTORY  Chief Complaint Ankle Pain (Right)   HPI Amy Logan is a 48 y.o. female presenting for evaluation of right foot and right ankle pain since Saturday after injury.  Patient states that she was walking her dog, and the ground was wet.  States when her dog pulled her foot remained planted but she rolled her ankle.  States that she has continued remain ambulatory but painful in doing so, mostly putting her weight on her ankle.  Reports has been taken her home naproxen and tramadol as needed which helps some.  Also applied ice.  States has a lot of pain moving her foot up and down as well.  Denies other pain or injuries.  States pain is currently mild to moderate.  Patient states that she has had some chronic paresthesias in the lower leg from her right knee replacement, but denies any acute changes to this.  Reports otherwise doing well denies other complaints.  Does report previous fracture to right lateral malleolus.  [name], [name], NP: PCP    Past Medical History:  Diagnosis Date  . Anemia   . Anxiety   . Arthritis   . Bradycardia   . 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 Fibromyalgia   . History of hiatal hernia    SMALL  . History of kidney stones    H/O  . Hypertension   . Neuropathy     Patient Active Problem List   Diagnosis Date Noted  . Anxiety and depression 10/24/2018  . Arthritis 10/24/2018  . Chronic, continuous use of opioids 10/24/2018  . Chronic insomnia 10/24/2018  . Hyperlipidemia 10/24/2018  . Hypertension 10/24/2018  . Neuropathy 10/24/2018  . Obesity (BMI 30-39.9) 10/24/2018  . H/O excision of epidermal inclusion cyst 10/24/2018  . SIRS (systemic inflammatory response syndrome) ([place]) 10/14/2018  . Epidermal inclusion cyst   . Mass of soft tissue of abdomen  09/30/2018  . B12 deficiency 11/20/2016  . Vitamin D insufficiency 11/20/2016  . Hx of normocytic normochromic anemia 12/20/2015  . Incomplete tear of right rotator cuff 11/21/2015    Past Surgical History:  Procedure Laterality Date  . ABDOMINAL HYSTERECTOMY    . APPENDECTOMY    . BREAST CYST ASPIRATION Left    neg  . CHOLECYSTECTOMY    . COLONOSCOPY  2018  . cyst removed     thyroglossal duct cyst  . JOINT REPLACEMENT    . LIPOMA EXCISION Right 10/09/2018   NOT A LIPOMA.  EPIDERMAL INCLUSION CYST  . REPLACEMENT TOTAL KNEE     Right knee     No current facility-administered medications for this encounter.   Current Outpatient Medications:  .  bacitracin 500 UNIT/GM ointment, Apply 1 application topically as needed for wound care., Disp: , Rfl:  .  cetirizine (ZYRTEC) 10 MG tablet, Take 10 mg by mouth daily as needed for allergies., Disp: , Rfl:  .  Cyanocobalamin (B-12 COMPLIANCE INJECTION IJ), Inject 1 Dose as directed every 30 (thirty) days., Disp: , Rfl:  .  DULoxetine (CYMBALTA) 60 MG capsule, Take 60 mg by mouth at bedtime., Disp: , Rfl:  .  lisinopril-hydrochlorothiazide (PRINZIDE,ZESTORETIC) 20-25 MG tablet, Take 1 tablet by mouth every morning., Disp: , Rfl:  .  naproxen sodium (ALEVE) 220 MG tablet, Take 220 mg by mouth 2 (two) times daily., Disp: ,  Rfl:  .  tiZANidine (ZANAFLEX) 4 MG tablet, TAKE 1 TABLET BY MOUTH NIGHTLY AS NEEDED, Disp: , Rfl:  .  traMADol (ULTRAM) 50 MG tablet, Take 50 mg by mouth 2 (two) times daily as needed. for pain, Disp: , Rfl:  .  Vitamin D, Ergocalciferol, (DRISDOL) 1.25 MG (50000 UT) CAPS capsule, Take 50,000 Units by mouth every Friday. , Disp: , Rfl: 0  Allergies Onion; Coconut flavor; Demerol [meperidine]; Morphine and related; Phenergan [promethazine hcl]; Stadol [butorphanol]; Zofran [ondansetron hcl]; and Imitrex [sumatriptan]  Family History  Problem Relation Age of Onset  . Diabetes Mother   . Heart failure Mother   . Asthma  Mother   . Cancer Father   . Breast cancer Cousin 70       mat cousin    Social History Social History   Tobacco Use  . Smoking status: Current Every Day Smoker    Packs/day: 0.50    Years: 25.00    Pack years: 12.50    Types: Cigarettes  . Smokeless tobacco: Never Used  Substance Use Topics  . Alcohol use: Yes    Alcohol/week: 0.0 standard drinks    Comment: social  . Drug use: No    Review of Systems Constitutional: No fever Cardiovascular: Denies chest pain. Respiratory: Denies shortness of breath. Musculoskeletal: Positive right foot and right ankle pain. Skin: Negative for rash. _____________   PHYSICAL EXAM:  VITAL SIGNS: ED Triage Vitals  Enc Vitals Group     BP 12/16/18 0859 105/81     Pulse Rate 12/16/18 0859 88     Resp 12/16/18 0859 16     Temp 12/16/18 0859 99.1 F (37.3 C)     Temp Source 12/16/18 0859 Oral     SpO2 12/16/18 0859 99 %     Weight 12/16/18 0856 197 lb (89.4 kg)     Height 12/16/18 0856 5\' 6"  (1.676 m)     Head Circumference --      Peak Flow --      Pain Score 12/16/18 0856 6     Pain Loc --      Pain Edu? --      Excl. in [place]? --     Constitutional: Alert and oriented. Well appearing and in no acute distress. ENT      Head: Normocephalic and atraumatic. Cardiovascular: Normal rate, regular rhythm. Grossly normal heart sounds.  Good peripheral circulation. Respiratory: Normal respiratory effort without tachypnea nor retractions. Breath sounds are clear and equal bilaterally. No wheezes, rales, rhonchi. Musculoskeletal:  Bilateral pedal pulses equal and easily palpated. Except: Right dorsal midfoot mild to moderate tenderness to direct palpation, no swelling, no ecchymosis.  Right lower lateral malleolus and surrounding moderate tenderness to palpation with minimal edema, mild ecchymosis to medial malleolus without tenderness, guarded movements, decreased ankle rotation, decreased plantar flexion and slightly decrease dorsiflexion,  right lower extremity otherwise nontender, no tenderness along Achilles tendon, and negative Thompson squeeze test.  Right foot normal distal sensation per patient. Neurologic:  Normal speech and languageSpeech is normal. Skin:  Skin is warm, dry and intact. No rash noted. Psychiatric: Mood and affect are normal. Speech and behavior are normal. Patient exhibits appropriate insight and judgment   ___________________________________________   LABS (all labs ordered are listed, but only abnormal results are displayed)  Labs Reviewed - No data to display ____________________________________________   RADIOLOGY  Dg Ankle Complete Right  Result Date: 12/16/2018 CLINICAL DATA:  Twisting injury with lateral ankle pain, initial encounter  EXAM: RIGHT ANKLE - COMPLETE 3+ VIEW COMPARISON:  None. FINDINGS: Minimal cortical irregularity is noted in the lateral aspect of the lateral malleolus. No significant overlying soft tissue changes are noted and this is likely chronic in nature. No other bony abnormality is seen aside from some calcaneal spurring. IMPRESSION: Degenerative change. Likely chronic changes in the distal fibula related to prior trauma given the lack of significant soft tissue swelling. Electronically Signed   By: Inez Catalina M.D.   On: 12/16/2018 09:39   Dg Foot Complete Right  Result Date: 12/16/2018 CLINICAL DATA:  Ankle injury 2 days ago. Midfoot to lateral malleolus pain and bruising. Initial encounter. EXAM: RIGHT FOOT COMPLETE - 3+ VIEW COMPARISON:  None. FINDINGS: No acute fracture or dislocation is identified in the foot. The ankle is reported separately. There is a moderate-sized plantar calcaneal enthesophyte. There may be an ankle joint effusion. IMPRESSION: No acute osseous abnormality identified in the foot. Electronically Signed   By: Logan Bores M.D.   On: 12/16/2018 09:44   ____________________________________________   PROCEDURES Procedures     INITIAL IMPRESSION  / ASSESSMENT AND PLAN / ED COURSE  Pertinent labs & imaging results that were available during my care of the patient were reviewed by me and considered in my medical decision making (see chart for details).  Well-appearing patient.  No acute distress.  Right foot and right ankle pain post mechanical injury.  Right foot and right ankle x-rays as above radiologist and reviewed by myself, no acute osseous abnormalities to the foot, degenerative changes with chronic changes likely to distal fibula.  Suspect sprain injuries, possible ligamentous injury.  Cam boot given.  Continue home naproxen.  Follow-up with podiatry this week.  Ice, elevate and supportive care.  Discussed follow up with Primary care physician this week. Discussed follow up and return parameters including no resolution or any worsening concerns. Patient verbalized understanding and agreed to plan.   ____________________________________________   FINAL CLINICAL IMPRESSION(S) / ED DIAGNOSES  Final diagnoses:  Right foot pain  Acute right ankle pain     ED Discharge Orders    None       Note: This dictation was prepared with Dragon dictation along with smaller phrase technology. Any transcriptional errors that result from this process are unintentional.         Marylene Land, NP 12/16/18 1107

## 2018-12-30 ENCOUNTER — Observation Stay
Admission: EM | Admit: 2018-12-30 | Discharge: 2018-12-31 | DRG: 392 | Disposition: A | Discharge status: Home / Self Care | Payer: 59 | Attending: Internal Medicine | Admitting: Internal Medicine

## 2018-12-30 ENCOUNTER — Emergency Department: Payer: 59

## 2018-12-30 ENCOUNTER — Other Ambulatory Visit: Payer: Self-pay

## 2018-12-30 DIAGNOSIS — D72829 Elevated white blood cell count, unspecified: Secondary | ICD-10-CM | POA: Diagnosis present

## 2018-12-30 DIAGNOSIS — E785 Hyperlipidemia, unspecified: Secondary | ICD-10-CM | POA: Diagnosis present

## 2018-12-30 DIAGNOSIS — Z90710 Acquired absence of both cervix and uterus: Secondary | ICD-10-CM

## 2018-12-30 DIAGNOSIS — M549 Dorsalgia, unspecified: Secondary | ICD-10-CM | POA: Diagnosis present

## 2018-12-30 DIAGNOSIS — I10 Essential (primary) hypertension: Secondary | ICD-10-CM | POA: Diagnosis present

## 2018-12-30 DIAGNOSIS — Z9049 Acquired absence of other specified parts of digestive tract: Secondary | ICD-10-CM | POA: Diagnosis not present

## 2018-12-30 DIAGNOSIS — R1031 Right lower quadrant pain: Principal | ICD-10-CM | POA: Diagnosis present

## 2018-12-30 DIAGNOSIS — F17210 Nicotine dependence, cigarettes, uncomplicated: Secondary | ICD-10-CM | POA: Diagnosis present

## 2018-12-30 DIAGNOSIS — F419 Anxiety disorder, unspecified: Secondary | ICD-10-CM | POA: Diagnosis present

## 2018-12-30 DIAGNOSIS — F32A Depression, unspecified: Secondary | ICD-10-CM | POA: Diagnosis present

## 2018-12-30 DIAGNOSIS — M797 Fibromyalgia: Secondary | ICD-10-CM | POA: Diagnosis present

## 2018-12-30 DIAGNOSIS — R102 Pelvic and perineal pain: Secondary | ICD-10-CM

## 2018-12-30 DIAGNOSIS — R188 Other ascites: Secondary | ICD-10-CM

## 2018-12-30 DIAGNOSIS — F329 Major depressive disorder, single episode, unspecified: Secondary | ICD-10-CM | POA: Diagnosis not present

## 2018-12-30 LAB — COMPREHENSIVE METABOLIC PANEL
ALK PHOS: 46 U/L (ref 38–126)
ALT: 14 U/L (ref 0–44)
AST: 16 U/L (ref 15–41)
Albumin: 4.4 g/dL (ref 3.5–5.0)
Anion gap: 9 (ref 5–15)
BILIRUBIN TOTAL: 0.7 mg/dL (ref 0.3–1.2)
BUN: 12 mg/dL (ref 6–20)
CALCIUM: 9.1 mg/dL (ref 8.9–10.3)
CHLORIDE: 100 mmol/L (ref 98–111)
CO2: 24 mmol/L (ref 22–32)
CREATININE: 0.67 mg/dL (ref 0.44–1.00)
Glucose, Bld: 120 mg/dL — ABNORMAL HIGH (ref 70–99)
Potassium: 3.6 mmol/L (ref 3.5–5.1)
Sodium: 133 mmol/L — ABNORMAL LOW (ref 135–145)
TOTAL PROTEIN: 7.4 g/dL (ref 6.5–8.1)

## 2018-12-30 LAB — URINALYSIS, COMPLETE (UACMP) WITH MICROSCOPIC
BILIRUBIN URINE: NEGATIVE
Bacteria, UA: NONE SEEN
GLUCOSE, UA: NEGATIVE mg/dL
Ketones, ur: NEGATIVE mg/dL
LEUKOCYTES UA: NEGATIVE
NITRITE: NEGATIVE
PH: 6 (ref 5.0–8.0)
Protein, ur: NEGATIVE mg/dL
SPECIFIC GRAVITY, URINE: 1.019 (ref 1.005–1.030)

## 2018-12-30 LAB — CBC
HCT: 35.7 % — ABNORMAL LOW (ref 36.0–46.0)
Hemoglobin: 12.1 g/dL (ref 12.0–15.0)
MCH: 32.6 pg (ref 26.0–34.0)
MCHC: 33.9 g/dL (ref 30.0–36.0)
MCV: 96.2 fL (ref 80.0–100.0)
NRBC: 0 % (ref 0.0–0.2)
PLATELETS: 287 10*3/uL (ref 150–400)
RBC: 3.71 MIL/uL — ABNORMAL LOW (ref 3.87–5.11)
RDW: 14 % (ref 11.5–15.5)
WBC: 22.5 10*3/uL — AB (ref 4.0–10.5)

## 2018-12-30 LAB — LACTIC ACID, PLASMA: Lactic Acid, Venous: 1.5 mmol/L (ref 0.5–1.9)

## 2018-12-30 LAB — LIPASE, BLOOD: LIPASE: 24 U/L (ref 11–51)

## 2018-12-30 MED ORDER — HYDROMORPHONE HCL 1 MG/ML IJ SOLN
1.0000 mg | Freq: Once | INTRAMUSCULAR | Status: AC
  Administered 2018-12-30: 1 mg via INTRAVENOUS
  Filled 2018-12-30: qty 1

## 2018-12-30 MED ORDER — PIPERACILLIN-TAZOBACTAM 3.375 G IVPB
3.3750 g | Freq: Three times a day (TID) | INTRAVENOUS | Status: DC
  Administered 2018-12-31 (×2): 3.375 g via INTRAVENOUS
  Filled 2018-12-30 (×4): qty 50

## 2018-12-30 MED ORDER — HYDROMORPHONE HCL 1 MG/ML IJ SOLN
INTRAMUSCULAR | Status: AC
  Filled 2018-12-30: qty 1

## 2018-12-30 MED ORDER — PIPERACILLIN-TAZOBACTAM 3.375 G IVPB 30 MIN
3.3750 g | Freq: Once | INTRAVENOUS | Status: AC
  Administered 2018-12-30: 3.375 g via INTRAVENOUS
  Filled 2018-12-30: qty 50

## 2018-12-30 MED ORDER — GADOBUTROL 1 MMOL/ML IV SOLN
7.5000 mL | Freq: Once | INTRAVENOUS | Status: AC | PRN
  Administered 2018-12-30: 7.5 mL via INTRAVENOUS
  Filled 2018-12-30: qty 7.5

## 2018-12-30 MED ORDER — HYDROMORPHONE HCL 1 MG/ML IJ SOLN
0.5000 mg | Freq: Once | INTRAMUSCULAR | Status: AC
  Administered 2018-12-30: 0.5 mg via INTRAVENOUS

## 2018-12-30 MED ORDER — SODIUM CHLORIDE 0.9% FLUSH: 3.0000 mL | Freq: Once | INTRAVENOUS | Status: DC

## 2018-12-30 MED ORDER — IOHEXOL 300 MG/ML  SOLN
100.0000 mL | Freq: Once | INTRAMUSCULAR | Status: AC | PRN
  Administered 2018-12-30: 100 mL via INTRAVENOUS
  Filled 2018-12-30: qty 100

## 2018-12-30 MED ORDER — HYDROMORPHONE HCL 1 MG/ML IJ SOLN
0.5000 mg | INTRAMUSCULAR | Status: DC | PRN
  Administered 2018-12-30 – 2018-12-31 (×4): 0.5 mg via INTRAVENOUS
  Filled 2018-12-30 (×3): qty 1

## 2018-12-30 MED ORDER — SODIUM CHLORIDE 0.9 % IV SOLN
Freq: Once | INTRAVENOUS | Status: AC
  Administered 2018-12-30: 15:00:00 via INTRAVENOUS

## 2018-12-30 NOTE — ED Triage Notes (Signed)
Pt comes via POV with c/o lower right back pain that radiates around to her right lower side.  Pt states this started this am. Pt states extensive medications taken with no relief.   Pt states she has taken 2 Goodys, 2 extra strength exlax, 2 prilosec, 1 10mg  flexiril and 1-5mg  oxy at 12 and another at 12:45.  Pt states 10/10 pain at this time.  Pt states some vomiting

## 2018-12-30 NOTE — ED Triage Notes (Signed)
Says onset this am with pain right flank that rad around to right lower quad.

## 2018-12-30 NOTE — ED Notes (Signed)
ED TO INPATIENT HANDOFF REPORT  Name/Age/Gender Amy Logan 48 y.o. female  Code Status Code Status History    Date Active Date Inactive Code Status Order ID Comments User Context   10/14/2018 0854 10/15/2018 1135 Full Code [id number]  Max Sane, MD Inpatient      Home/SNF/Other Home  Chief Complaint low back pain rads into abd  Level of Care/Admitting Diagnosis ED Disposition    ED Disposition Condition [place]: [place] [[id number]]  Level of Care: Med-Surg [16]  Diagnosis: RLQ abdominal pain [263785]  Admitting Physician: Lance Coon [[id number]]  Attending Physician: Lance Coon [phone]  Estimated length of stay: past midnight tomorrow  Certification:: I certify this patient will need inpatient services for at least 2 midnights  PT Class (Do Not Modify): Inpatient [101]  PT Acc Code (Do Not Modify): Private [1]       Medical History Past Medical History:  Diagnosis Date  . Anemia   . Anxiety   . Arthritis   . Bradycardia   . Cancer ([place]) 1990   cervical cells stage 4  . Fibromyalgia   . History of hiatal hernia    SMALL  . History of kidney stones    H/O  . Hypertension   . Neuropathy     Allergies Allergies  Allergen Reactions  . Onion Anaphylaxis  . Coconut Flavor Hives  . Demerol [Meperidine] Hives    agitated   . Morphine And Related Hives    Agitated   . Phenergan [Promethazine Hcl]     Tremors/ spasms   . Stadol [Butorphanol] Hives    Increases BP  . Zofran [Ondansetron Hcl] Hives  . Imitrex [Sumatriptan] Palpitations    IV Location/Drains/Wounds Patient Lines/Drains/Airways Status   Active Line/Drains/Airways    Name:   Placement date:   Placement time:   Site:   Days:   Peripheral IV 12/30/18 Right Forearm   12/30/18    1448    Forearm   less than 1   Incision (Closed) 10/09/18 Abdomen   10/09/18    0808     82   Incision - 1 Port Abdomen    10/09/18    0751     82           Labs/Imaging Results for orders placed or performed during the hospital encounter of 12/30/18 (from the past 48 hour(s))  Urinalysis, Complete w Microscopic     Status: Abnormal   Collection Time: 12/30/18  2:03 PM  Result Value Ref Range   Color, Urine YELLOW (A) YELLOW   APPearance CLEAR (A) CLEAR   Specific Gravity, Urine 1.019 1.005 - 1.030   pH 6.0 5.0 - 8.0   Glucose, UA NEGATIVE NEGATIVE mg/dL   Hgb urine dipstick SMALL (A) NEGATIVE   Bilirubin Urine NEGATIVE NEGATIVE   Ketones, ur NEGATIVE NEGATIVE mg/dL   Protein, ur NEGATIVE NEGATIVE mg/dL   Nitrite NEGATIVE NEGATIVE   Leukocytes, UA NEGATIVE NEGATIVE   RBC / HPF 21-50 0 - 5 RBC/hpf   WBC, UA 0-5 0 - 5 WBC/hpf   Bacteria, UA NONE SEEN NONE SEEN   Squamous Epithelial / LPF 0-5 0 - 5    Comment: Performed at [facility], [address] [place], [place] [address]  Lipase, blood     Status: None   Collection Time: 12/30/18  2:04 PM  Result Value Ref Range   Lipase 24 11 - 51 U/L    Comment:  Performed at [facility], [address] [place], [place] [address]  Comprehensive metabolic panel     Status: Abnormal   Collection Time: 12/30/18  2:04 PM  Result Value Ref Range   Sodium 133 (L) 135 - 145 mmol/L   Potassium 3.6 3.5 - 5.1 mmol/L   Chloride 100 98 - 111 mmol/L   CO2 24 22 - 32 mmol/L   Glucose, Bld 120 (H) 70 - 99 mg/dL   BUN 12 6 - 20 mg/dL   Creatinine, Ser 0.67 0.44 - 1.00 mg/dL   Calcium 9.1 8.9 - 10.3 mg/dL   Total Protein 7.4 6.5 - 8.1 g/dL   Albumin 4.4 3.5 - 5.0 g/dL   AST 16 15 - 41 U/L   ALT 14 0 - 44 U/L   Alkaline Phosphatase 46 38 - 126 U/L   Total Bilirubin 0.7 0.3 - 1.2 mg/dL   GFR calc non Af Amer >60 >60 mL/min   GFR calc Af Amer >60 >60 mL/min   Anion gap 9 5 - 15    Comment: Performed at [facility], [address] [place], [place] [address]  CBC     Status: Abnormal   Collection Time: 12/30/18  2:04 PM  Result Value Ref Range   WBC 22.5 (H) 4.0 -  10.5 K/uL   RBC 3.71 (L) 3.87 - 5.11 MIL/uL   Hemoglobin 12.1 12.0 - 15.0 g/dL   HCT 35.7 (L) 36.0 - 46.0 %   MCV 96.2 80.0 - 100.0 fL   MCH 32.6 26.0 - 34.0 pg   MCHC 33.9 30.0 - 36.0 g/dL   RDW 14.0 11.5 - 15.5 %   Platelets 287 150 - 400 K/uL   nRBC 0.0 0.0 - 0.2 %    Comment: Performed at [facility], [address] [place], [place] [address]  Lactic acid, plasma     Status: None   Collection Time: 12/30/18  2:59 PM  Result Value Ref Range   Lactic Acid, Venous 1.5 0.5 - 1.9 mmol/L    Comment: Performed at [facility], [address], [place], [place] [address]   Mr Abdomen W Or Wo Contrast  Result Date: 12/30/2018 CLINICAL DATA:  Right-sided abdominal pain.  "Abdominal infection". EXAM: MRI ABDOMEN WITHOUT AND WITH CONTRAST TECHNIQUE: Multiplanar multisequence MR imaging of the abdomen was performed both before and after the administration of intravenous contrast. CONTRAST:  7.5 cc Gadavist COMPARISON:  Today's pelvic ultrasound and abdominopelvic CTs, dictated separately. FINDINGS: Portions of exam are minimally motion degraded. Lower chest: Small hiatal hernia. Normal heart size without pericardial or pleural effusion. Hepatobiliary: Hepatomegaly, 22.4 cm craniocaudal. No focal liver lesion. Cholecystectomy. Common duct upper normal for prior cholecystectomy. Example at 10 mm in the porta hepatis. No obstructive stone or mass. Pancreas:  Normal, without mass or ductal dilatation. Spleen:  Normal in size, without focal abnormality. Adrenals/Urinary Tract: Normal adrenal glands. Normal kidneys, without hydronephrosis. Stomach/Bowel: Normal distal stomach and abdominal bowel loops. Vascular/Lymphatic: Normal caliber of the aorta and branch vessels. No abdominal adenopathy. Other: Small volume right-sided abdominal fluid, including image 36/4. Musculoskeletal: No acute osseous abnormality. IMPRESSION: 1. Small volume abdominal fluid, as detailed on CT. 2. Otherwise, no  acute process in the abdomen. 3. Hepatomegaly. 4. Cholecystectomy with borderline common duct dilatation, most likely within normal variation. No cause identified. 5. Small hiatal hernia. Electronically Signed   By: Abigail Miyamoto M.D.   On: 12/30/2018 20:06   US Pelvis Transvanginal Non-ob (tv  Only)  Result Date: 12/30/2018 CLINICAL DATA:  Right-sided pelvic pain post hysterectomy. Possible torsion. EXAM: TRANSABDOMINAL AND TRANSVAGINAL ULTRASOUND OF PELVIS TECHNIQUE: Both transabdominal and transvaginal ultrasound examinations of the pelvis were performed. Transabdominal technique was performed for global imaging of the pelvis including uterus, ovaries, adnexal regions, and pelvic cul-de-sac. It was necessary to proceed with endovaginal exam following the transabdominal exam to visualize the ovaries. COMPARISON:  Abdominopelvic CT same date and 10/14/2018 FINDINGS: Uterus Measurements: Surgically absent. Right ovary Measurements: Not visualized.  No adnexal mass seen. Left ovary Measurements: Not visualized.  No adnexal mass seen. Other findings A moderate amount of free pelvic fluid is present. IMPRESSION: 1. Neither ovary is visualized post hysterectomy. 2. Etiology for the right adnexal enlargement seen on earlier CT is not further clarified. 3. Nonspecific moderate free pelvic fluid. Electronically Signed   By: Richardean Sale M.D.   On: 12/30/2018 17:04   US Pelvis Complete  Result Date: 12/30/2018 CLINICAL DATA:  Right-sided pelvic pain post hysterectomy. Possible torsion. EXAM: TRANSABDOMINAL AND TRANSVAGINAL ULTRASOUND OF PELVIS TECHNIQUE: Both transabdominal and transvaginal ultrasound examinations of the pelvis were performed. Transabdominal technique was performed for global imaging of the pelvis including uterus, ovaries, adnexal regions, and pelvic cul-de-sac. It was necessary to proceed with endovaginal exam following the transabdominal exam to visualize the ovaries. COMPARISON:  Abdominopelvic  CT same date and 10/14/2018 FINDINGS: Uterus Measurements: Surgically absent. Right ovary Measurements: Not visualized.  No adnexal mass seen. Left ovary Measurements: Not visualized.  No adnexal mass seen. Other findings A moderate amount of free pelvic fluid is present. IMPRESSION: 1. Neither ovary is visualized post hysterectomy. 2. Etiology for the right adnexal enlargement seen on earlier CT is not further clarified. 3. Nonspecific moderate free pelvic fluid. Electronically Signed   By: Richardean Sale M.D.   On: 12/30/2018 17:04   Ct Abdomen Pelvis W Contrast  Result Date: 12/30/2018 CLINICAL DATA:  Abdominal pain.  Leukocytosis. EXAM: CT ABDOMEN AND PELVIS WITH CONTRAST TECHNIQUE: Multidetector CT imaging of the abdomen and pelvis was performed using the standard protocol following bolus administration of intravenous contrast. CONTRAST:  120mL OMNIPAQUE IOHEXOL 300 MG/ML  SOLN COMPARISON:  CT 10/14/2018 FINDINGS: Lower chest: Lung bases are clear. Hepatobiliary: Postcholecystectomy. Mild prominence of intrahepatic bile ducts similar comparison to comparison exam. The common bile duct measures 9 mm (image 31/2 which compares to 9 mm on comparison exam. Pancreas: No pancreatic duct dilatation. No pancreatic inflammation. Spleen: Normal spleen Adrenals/urinary tract: Adrenal glands and kidneys are normal. The ureters and bladder normal. Stomach/Bowel: Stomach, small-bowel cecum normal. The cecum has been migrated into the LEFT upper quadrant (image 23/2). No evidence cecal dilatation or obstruction. Post appendectomy. There is some fluid along the RIGHT lower quadrant which is moderately high-density just above simple fluid density. This fluid extends in the RIGHT pelvis. Ascending, transverse and descending colon normal. Rectum normal. Several diverticular the descending colon without acute inflammation Vascular/Lymphatic: Abdominal aorta is normal caliber. No periportal or retroperitoneal adenopathy. No  pelvic adenopathy. Reproductive: Post hysterectomy. There is fluid in the RIGHT adnexal region. The RIGHT ovary is difficult to identify. On previous there are clips adjacent adjacent to the RIGHT ovary. There is ovoid soft tissue mass in this same region measuring 4.7 by 3.0 cm by 6.0 (Image 67/7, image 46/5). There is fluid adjacent to this rounded soft tissue mass which is high-density. This mass lesion is immediately associated with the small bowel loops. Fluid extends to the pelvis. The LEFT ovary is not  identified.  Post hysterectomy. Other: No intraperitoneal free air. Musculoskeletal: No aggressive osseous lesion. IMPRESSION: 1. Inflammatory process in the RIGHT lower quadrant / RIGHT adnexal region. RIGHT ovary is poorly defined and difficult to separate from the small bowel but concern for enlargement of the RIGHT ovary as described above. Differential for RIGHT adnexa process would include RIGHT TUBO-OVARIAN ABSCESS AND OVARIAN TORSION TORSION. Recommend pelvic ultrasound for further evaluation. 2. Cecum has migrated into the LEFT upper quadrant. No evidence obstruction or volvulus. The migration of the bowel could potentially result in vascular compromise to the RIGHT ovary. 3. Post appendectomy and cholecystectomy. Dilatation of the common bile duct is similar to prior and favored benign post cholecystectomy dilatation. Electronically Signed   By: Suzy Bouchard M.D.   On: 12/30/2018 15:43    Pending Labs Unresulted Labs (From admission, onward)    Start     Ordered   12/30/18 1437  Blood culture (routine x 2)  BLOOD CULTURE X 2,   STAT     12/30/18 1436   Signed and Held  CBC  (enoxaparin (LOVENOX)    CrCl >/= 30 ml/min)  Once,   R    Comments:  Baseline for enoxaparin therapy IF NOT ALREADY DRAWN.  Notify MD if PLT < 100 K.    Signed and Held   Signed and Held  Creatinine, serum  (enoxaparin (LOVENOX)    CrCl >/= 30 ml/min)  Once,   R    Comments:  Baseline for enoxaparin therapy IF NOT  ALREADY DRAWN.    Signed and Held   Signed and Held  Creatinine, serum  (enoxaparin (LOVENOX)    CrCl >/= 30 ml/min)  Weekly,   R    Comments:  while on enoxaparin therapy    Signed and Held   Signed and Held  Basic metabolic panel  Tomorrow morning,   R     Signed and Held   Signed and Held  CBC  Tomorrow morning,   R     Signed and Held          Vitals/Pain Today's Vitals   12/30/18 1725 12/30/18 1730 12/30/18 1819 12/30/18 2141  BP:  119/78  114/75  Pulse:  78  (!) 58  Resp:  16  18  Temp:      SpO2:  99%  99%  Weight:      Height:      PainSc: 8   4  8      Isolation Precautions No active isolations  Medications Medications  sodium chloride flush (NS) 0.9 % injection 3 mL (has no administration in time range)  HYDROmorphone (DILAUDID) injection 0.5 mg (0.5 mg Intravenous Given 12/30/18 2139)  piperacillin-tazobactam (ZOSYN) IVPB 3.375 g (has no administration in time range)  0.9 %  sodium chloride infusion ( Intravenous Stopped 12/30/18 1819)  HYDROmorphone (DILAUDID) injection 1 mg (1 mg Intravenous Given 12/30/18 1450)  iohexol (OMNIPAQUE) 300 MG/ML solution 100 mL (100 mLs Intravenous Contrast Given 12/30/18 1509)  HYDROmorphone (DILAUDID) injection 0.5 mg (0.5 mg Intravenous Given 12/30/18 1726)  piperacillin-tazobactam (ZOSYN) IVPB 3.375 g (0 g Intravenous Stopped 12/30/18 1805)  gadobutrol (GADAVIST) 1 MMOL/ML injection 7.5 mL (7.5 mLs Intravenous Contrast Given 12/30/18 1942)    Mobility walks

## 2018-12-30 NOTE — ED Notes (Signed)
Primedoc in with pt for admission.

## 2018-12-30 NOTE — ED Notes (Signed)
Pt moved to 18h while waiting on admission bed.   Pt alert.  Iv in place

## 2018-12-30 NOTE — H&P (Signed)
[facility] NAME: Amy Logan    MR#:  [id number]  DATE OF BIRTH:  December 03, 1970  DATE OF ADMISSION:  12/30/2018  PRIMARY CARE PHYSICIAN: Dayton Martes [name], NP   REQUESTING/REFERRING PHYSICIAN: Jimmye Norman, MD  CHIEF COMPLAINT:   Chief Complaint  Patient presents with  . Back Pain    HISTORY OF PRESENT ILLNESS:  Amy Logan  is a 48 y.o. female who presents with chief complaint as above.  Patient presents to the ED with a complaint of right-sided back/flank pain which moved around to her right lower quadrant today.  She states that she was hospitalized here about 3 months ago with something similar, at the time she was just postop from excision of an abdominal wall lesion higher up on her abdomen.  At the time she was diagnosed with SIRS, and it was unclear if that hospitalization had something to do with her postop status or not.  However, today she is well-healed from her surgery, and presents with leukocytosis and this complaint of pain again.  CT imaging of her abdomen and pelvis questioned right ovarian involvement, abscess versus torsion.  Further imaging with pelvic ultrasound and MRI abdomen was not helpful in clarifying any abdominal pathology.  CT and MRI both saw some nonspecific abdominal fluid collection in her right lower quadrant.  ED physician spoke with general surgery and gynecology who both requested hospitalist be called for admission and further evaluation.  PAST MEDICAL HISTORY:   Past Medical History:  Diagnosis Date  . Anemia   . Anxiety   . Arthritis   . Bradycardia   . Cancer ([place]) 1990   cervical cells stage 4  . Fibromyalgia   . History of hiatal hernia    SMALL  . History of kidney stones    H/O  . Hypertension   . Neuropathy      PAST SURGICAL HISTORY:   Past Surgical History:  Procedure Laterality Date  . ABDOMINAL HYSTERECTOMY    . APPENDECTOMY    . BREAST CYST ASPIRATION Left     neg  . CHOLECYSTECTOMY    . COLONOSCOPY  2018  . cyst removed     thyroglossal duct cyst  . JOINT REPLACEMENT    . LIPOMA EXCISION Right 10/09/2018   NOT A LIPOMA.  EPIDERMAL INCLUSION CYST  . REPLACEMENT TOTAL KNEE     Right knee     SOCIAL HISTORY:   Social History   Tobacco Use  . Smoking status: Current Every Day Smoker    Packs/day: 0.50    Years: 25.00    Pack years: 12.50    Types: Cigarettes  . Smokeless tobacco: Never Used  Substance Use Topics  . Alcohol use: Yes    Alcohol/week: 0.0 standard drinks    Comment: social     FAMILY HISTORY:   Family History  Problem Relation Age of Onset  . Diabetes Mother   . Heart failure Mother   . Asthma Mother   . Cancer Father   . Breast cancer Cousin 16       mat cousin     DRUG ALLERGIES:   Allergies  Allergen Reactions  . Onion Anaphylaxis  . Coconut Flavor Hives  . Demerol [Meperidine] Hives    agitated   . Morphine And Related Hives    Agitated   . Phenergan [Promethazine Hcl]     Tremors/ spasms   . Stadol [Butorphanol] Hives    Increases  BP  . Zofran [Ondansetron Hcl] Hives  . Imitrex [Sumatriptan] Palpitations    MEDICATIONS AT HOME:   Prior to Admission medications   Medication Sig Start Date End Date Taking? Authorizing Provider  cetirizine (ZYRTEC) 10 MG tablet Take 10 mg by mouth daily as needed for allergies.   Yes [provider]  DULoxetine (CYMBALTA) 60 MG capsule Take 60 mg by mouth at bedtime.   Yes [provider]  lisinopril-hydrochlorothiazide (PRINZIDE,ZESTORETIC) 20-25 MG tablet Take 1 tablet by mouth every morning.   Yes [provider]  naproxen sodium (ALEVE) 220 MG tablet Take 220 mg by mouth 2 (two) times daily.   Yes [provider]  traMADol (ULTRAM) 50 MG tablet Take 50 mg by mouth 2 (two) times daily.  11/22/18  Yes [provider]    REVIEW OF SYSTEMS:  Review of Systems  Constitutional: Negative for chills, fever,  malaise/fatigue and weight loss.  HENT: Negative for ear pain, hearing loss and tinnitus.   Eyes: Negative for blurred vision, double vision, pain and redness.  Respiratory: Negative for cough, hemoptysis and shortness of breath.   Cardiovascular: Negative for chest pain, palpitations, orthopnea and leg swelling.  Gastrointestinal: Positive for abdominal pain and nausea. Negative for constipation, diarrhea and vomiting.  Genitourinary: Negative for dysuria, frequency and hematuria.  Musculoskeletal: Negative for back pain, joint pain and neck pain.  Skin:       No acne, rash, or lesions  Neurological: Negative for dizziness, tremors, focal weakness and weakness.  Endo/Heme/Allergies: Negative for polydipsia. Does not bruise/bleed easily.  Psychiatric/Behavioral: Negative for depression. The patient is not nervous/anxious and does not have insomnia.      VITAL SIGNS:   Vitals:   12/30/18 1401 12/30/18 1403 12/30/18 1730  BP:  136/81 119/78  Pulse:  87 78  Resp:  18 16  Temp:  98.2 F (36.8 C)   SpO2:  99% 99%  Weight: 87.1 kg    Height: 5\' 6"  (1.676 m)     Wt Readings from Last 3 Encounters:  12/30/18 87.1 kg  12/16/18 89.4 kg  10/24/18 96.8 kg    PHYSICAL EXAMINATION:  Physical Exam  Vitals reviewed. Constitutional: She is oriented to person, place, and time. She appears well-developed and well-nourished. No distress.  HENT:  Head: Normocephalic and atraumatic.  Mouth/Throat: Oropharynx is clear and moist.  Eyes: Pupils are equal, round, and reactive to light. Conjunctivae and EOM are normal. No scleral icterus.  Neck: Normal range of motion. Neck supple. No JVD present. No thyromegaly present.  Cardiovascular: Normal rate, regular rhythm and intact distal pulses. Exam reveals no gallop and no friction rub.  No murmur heard. Respiratory: Effort normal and breath sounds normal. No respiratory distress. She has no wheezes. She has no rales.  GI: Soft. Bowel sounds are  normal. She exhibits no distension. There is abdominal tenderness.  Musculoskeletal: Normal range of motion.        General: No edema.     Comments: No arthritis, no gout  Lymphadenopathy:    She has no cervical adenopathy.  Neurological: She is alert and oriented to person, place, and time. No cranial nerve deficit.  No dysarthria, no aphasia  Skin: Skin is warm and dry. No rash noted. No erythema.  Psychiatric: She has a normal mood and affect. Her behavior is normal. Judgment and thought content normal.    LABORATORY PANEL:   CBC Recent Labs  Lab 12/30/18 1404  WBC 22.5*  HGB 12.1  HCT 35.7*  PLT 287   ------------------------------------------------------------------------------------------------------------------  Chemistries  Recent Labs  Lab 12/30/18 1404  NA 133*  K 3.6  CL 100  CO2 24  GLUCOSE 120*  BUN 12  CREATININE 0.67  CALCIUM 9.1  AST 16  ALT 14  ALKPHOS 46  BILITOT 0.7   ------------------------------------------------------------------------------------------------------------------  Cardiac Enzymes No results for input(s): TROPONINI in the last 168 hours. ------------------------------------------------------------------------------------------------------------------  RADIOLOGY:  Mr Abdomen W Or Wo Contrast  Result Date: 12/30/2018 CLINICAL DATA:  Right-sided abdominal pain.  "Abdominal infection". EXAM: MRI ABDOMEN WITHOUT AND WITH CONTRAST TECHNIQUE: Multiplanar multisequence MR imaging of the abdomen was performed both before and after the administration of intravenous contrast. CONTRAST:  7.5 cc Gadavist COMPARISON:  Today's pelvic ultrasound and abdominopelvic CTs, dictated separately. FINDINGS: Portions of exam are minimally motion degraded. Lower chest: Small hiatal hernia. Normal heart size without pericardial or pleural effusion. Hepatobiliary: Hepatomegaly, 22.4 cm craniocaudal. No focal liver lesion. Cholecystectomy. Common duct upper  normal for prior cholecystectomy. Example at 10 mm in the porta hepatis. No obstructive stone or mass. Pancreas:  Normal, without mass or ductal dilatation. Spleen:  Normal in size, without focal abnormality. Adrenals/Urinary Tract: Normal adrenal glands. Normal kidneys, without hydronephrosis. Stomach/Bowel: Normal distal stomach and abdominal bowel loops. Vascular/Lymphatic: Normal caliber of the aorta and branch vessels. No abdominal adenopathy. Other: Small volume right-sided abdominal fluid, including image 36/4. Musculoskeletal: No acute osseous abnormality. IMPRESSION: 1. Small volume abdominal fluid, as detailed on CT. 2. Otherwise, no acute process in the abdomen. 3. Hepatomegaly. 4. Cholecystectomy with borderline common duct dilatation, most likely within normal variation. No cause identified. 5. Small hiatal hernia. Electronically Signed   By: Abigail Miyamoto M.D.   On: 12/30/2018 20:06   US Pelvis Transvanginal Non-ob (tv Only)  Result Date: 12/30/2018 CLINICAL DATA:  Right-sided pelvic pain post hysterectomy. Possible torsion. EXAM: TRANSABDOMINAL AND TRANSVAGINAL ULTRASOUND OF PELVIS TECHNIQUE: Both transabdominal and transvaginal ultrasound examinations of the pelvis were performed. Transabdominal technique was performed for global imaging of the pelvis including uterus, ovaries, adnexal regions, and pelvic cul-de-sac. It was necessary to proceed with endovaginal exam following the transabdominal exam to visualize the ovaries. COMPARISON:  Abdominopelvic CT same date and 10/14/2018 FINDINGS: Uterus Measurements: Surgically absent. Right ovary Measurements: Not visualized.  No adnexal mass seen. Left ovary Measurements: Not visualized.  No adnexal mass seen. Other findings A moderate amount of free pelvic fluid is present. IMPRESSION: 1. Neither ovary is visualized post hysterectomy. 2. Etiology for the right adnexal enlargement seen on earlier CT is not further clarified. 3. Nonspecific moderate  free pelvic fluid. Electronically Signed   By: Richardean Sale M.D.   On: 12/30/2018 17:04   US Pelvis Complete  Result Date: 12/30/2018 CLINICAL DATA:  Right-sided pelvic pain post hysterectomy. Possible torsion. EXAM: TRANSABDOMINAL AND TRANSVAGINAL ULTRASOUND OF PELVIS TECHNIQUE: Both transabdominal and transvaginal ultrasound examinations of the pelvis were performed. Transabdominal technique was performed for global imaging of the pelvis including uterus, ovaries, adnexal regions, and pelvic cul-de-sac. It was necessary to proceed with endovaginal exam following the transabdominal exam to visualize the ovaries. COMPARISON:  Abdominopelvic CT same date and 10/14/2018 FINDINGS: Uterus Measurements: Surgically absent. Right ovary Measurements: Not visualized.  No adnexal mass seen. Left ovary Measurements: Not visualized.  No adnexal mass seen. Other findings A moderate amount of free pelvic fluid is present. IMPRESSION: 1. Neither ovary is visualized post hysterectomy. 2. Etiology for the right adnexal enlargement seen on earlier CT is not further clarified. 3.  Nonspecific moderate free pelvic fluid. Electronically Signed   By: Richardean Sale M.D.   On: 12/30/2018 17:04   Ct Abdomen Pelvis W Contrast  Result Date: 12/30/2018 CLINICAL DATA:  Abdominal pain.  Leukocytosis. EXAM: CT ABDOMEN AND PELVIS WITH CONTRAST TECHNIQUE: Multidetector CT imaging of the abdomen and pelvis was performed using the standard protocol following bolus administration of intravenous contrast. CONTRAST:  117mL OMNIPAQUE IOHEXOL 300 MG/ML  SOLN COMPARISON:  CT 10/14/2018 FINDINGS: Lower chest: Lung bases are clear. Hepatobiliary: Postcholecystectomy. Mild prominence of intrahepatic bile ducts similar comparison to comparison exam. The common bile duct measures 9 mm (image 31/2 which compares to 9 mm on comparison exam. Pancreas: No pancreatic duct dilatation. No pancreatic inflammation. Spleen: Normal spleen Adrenals/urinary  tract: Adrenal glands and kidneys are normal. The ureters and bladder normal. Stomach/Bowel: Stomach, small-bowel cecum normal. The cecum has been migrated into the LEFT upper quadrant (image 23/2). No evidence cecal dilatation or obstruction. Post appendectomy. There is some fluid along the RIGHT lower quadrant which is moderately high-density just above simple fluid density. This fluid extends in the RIGHT pelvis. Ascending, transverse and descending colon normal. Rectum normal. Several diverticular the descending colon without acute inflammation Vascular/Lymphatic: Abdominal aorta is normal caliber. No periportal or retroperitoneal adenopathy. No pelvic adenopathy. Reproductive: Post hysterectomy. There is fluid in the RIGHT adnexal region. The RIGHT ovary is difficult to identify. On previous there are clips adjacent adjacent to the RIGHT ovary. There is ovoid soft tissue mass in this same region measuring 4.7 by 3.0 cm by 6.0 (Image 67/7, image 46/5). There is fluid adjacent to this rounded soft tissue mass which is high-density. This mass lesion is immediately associated with the small bowel loops. Fluid extends to the pelvis. The LEFT ovary is not identified.  Post hysterectomy. Other: No intraperitoneal free air. Musculoskeletal: No aggressive osseous lesion. IMPRESSION: 1. Inflammatory process in the RIGHT lower quadrant / RIGHT adnexal region. RIGHT ovary is poorly defined and difficult to separate from the small bowel but concern for enlargement of the RIGHT ovary as described above. Differential for RIGHT adnexa process would include RIGHT TUBO-OVARIAN ABSCESS AND OVARIAN TORSION TORSION. Recommend pelvic ultrasound for further evaluation. 2. Cecum has migrated into the LEFT upper quadrant. No evidence obstruction or volvulus. The migration of the bowel could potentially result in vascular compromise to the RIGHT ovary. 3. Post appendectomy and cholecystectomy. Dilatation of the common bile duct is  similar to prior and favored benign post cholecystectomy dilatation. Electronically Signed   By: Suzy Bouchard M.D.   On: 12/30/2018 15:43    EKG:   Orders placed or performed during the hospital encounter of 10/14/18  . EKG 12-Lead  . EKG 12-Lead  . EKG    IMPRESSION AND PLAN:  Principal Problem:   RLQ abdominal pain -unclear etiology at this time.  She has fluid in her right lower quadrant on imaging, and CT imaging questions ovarian involvement of some kind.  We have started IV Zosyn, and will start with a gynecology consult to further direct treatment. Active Problems:   Leukocytosis -likely related to pathology causing her right lower quadrant abdominal pain.  See above for treatment   Hypertension -home dose antihypertensives   Anxiety and depression -continue home meds   Hyperlipidemia -home dose antilipid  Chart review performed and case discussed with ED provider. Labs, imaging and/or ECG reviewed by provider and discussed with patient/family. Management plans discussed with the patient and/or family.  DVT PROPHYLAXIS: SubQ lovenox  GI PROPHYLAXIS:  None  ADMISSION STATUS: Inpatient     CODE STATUS: Full Code Status History    Date Active Date Inactive Code Status Order ID Comments User Context   10/14/2018 0854 10/15/2018 1135 Full Code [id number]  Max Sane, MD Inpatient      TOTAL TIME TAKING CARE OF THIS PATIENT: 45 minutes.   Ethlyn Daniels 12/30/2018, 9:18 PM  [facility]  Office  [phone]  CC: Primary care physician; Sallee Lange, NP  Note:  This document was prepared using Dragon voice recognition software and may include unintentional dictation errors.

## 2018-12-30 NOTE — ED Provider Notes (Signed)
[facility] Emergency Department Provider Note       Time seen: ----------------------------------------- 2:43 PM on 12/30/2018 -----------------------------------------   I have reviewed the triage vital signs and the nursing notes.  HISTORY   Chief Complaint Back Pain    HPI Amy Logan is a 48 y.o. female with a history of anemia, anxiety, bradycardia, cervical cancer, fibromyalgia, kidney stones, hypertension who presents to the ED for right flank pain.  Patient is complaining of right lower back pain that radiates to her right lower quadrant.  Patient states symptoms started this morning, she has been taking over-the-counter medications without any improvement.  Pain is 10 out of 10 on arrival.  She has had some vomiting, was seen for this 3 months ago and diagnosed with Sirs.  Past Medical History:  Diagnosis Date  . Anemia   . Anxiety   . Arthritis   . Bradycardia   . Cancer ([place]) 1990   cervical cells stage 4  . Fibromyalgia   . History of hiatal hernia    SMALL  . History of kidney stones    H/O  . Hypertension   . Neuropathy     Patient Active Problem List   Diagnosis Date Noted  . Anxiety and depression 10/24/2018  . Arthritis 10/24/2018  . Chronic, continuous use of opioids 10/24/2018  . Chronic insomnia 10/24/2018  . Hyperlipidemia 10/24/2018  . Hypertension 10/24/2018  . Neuropathy 10/24/2018  . Obesity (BMI 30-39.9) 10/24/2018  . H/O excision of epidermal inclusion cyst 10/24/2018  . SIRS (systemic inflammatory response syndrome) ([place]) 10/14/2018  . Epidermal inclusion cyst   . Mass of soft tissue of abdomen 09/30/2018  . B12 deficiency 11/20/2016  . Vitamin D insufficiency 11/20/2016  . Hx of normocytic normochromic anemia 12/20/2015  . Incomplete tear of right rotator cuff 11/21/2015    Past Surgical History:  Procedure Laterality Date  . ABDOMINAL HYSTERECTOMY    . APPENDECTOMY    . BREAST CYST ASPIRATION Left    neg  . CHOLECYSTECTOMY    . COLONOSCOPY  2018  . cyst removed     thyroglossal duct cyst  . JOINT REPLACEMENT    . LIPOMA EXCISION Right 10/09/2018   NOT A LIPOMA.  EPIDERMAL INCLUSION CYST  . REPLACEMENT TOTAL KNEE     Right knee    Allergies Onion; Coconut flavor; Demerol [meperidine]; Morphine and related; Phenergan [promethazine hcl]; Stadol [butorphanol]; Zofran [ondansetron hcl]; and Imitrex [sumatriptan]  Social History Social History   Tobacco Use  . Smoking status: Current Every Day Smoker    Packs/day: 0.50    Years: 25.00    Pack years: 12.50    Types: Cigarettes  . Smokeless tobacco: Never Used  Substance Use Topics  . Alcohol use: Yes    Alcohol/week: 0.0 standard drinks    Comment: social  . Drug use: No   Review of Systems Constitutional: Negative for fever, chills Cardiovascular: Negative for chest pain. Respiratory: Negative for shortness of breath. Gastrointestinal: Positive for abdominal pain, vomiting Musculoskeletal: Positive for back pain Skin: Negative for rash. Neurological: Negative for headaches, focal weakness or numbness.  All systems negative/normal/unremarkable except as stated in the HPI  ____________________________________________   PHYSICAL EXAM:  VITAL SIGNS: ED Triage Vitals  Enc Vitals Group     BP 12/30/18 1403 136/81     Pulse Rate 12/30/18 1403 87     Resp 12/30/18 1403 18     Temp 12/30/18 1403 98.2 F (36.8 C)  Temp src --      SpO2 12/30/18 1403 99 %     Weight 12/30/18 1401 192 lb (87.1 kg)     Height 12/30/18 1401 5\' 6"  (1.676 m)     Head Circumference --      Peak Flow --      Pain Score 12/30/18 1401 10     Pain Loc --      Pain Edu? --      Excl. in [place]? --    Constitutional: Alert and oriented. Well appearing and in no distress. Eyes: Conjunctivae are normal. Normal extraocular movements. ENT      Head: Normocephalic and atraumatic.      Nose: No congestion/rhinnorhea.      Mouth/Throat: Mucous  membranes are moist.      Neck: No stridor. Cardiovascular: Normal rate, regular rhythm. No murmurs, rubs, or gallops. Respiratory: Normal respiratory effort without tachypnea nor retractions. Breath sounds are clear and equal bilaterally. No wheezes/rales/rhonchi. Gastrointestinal: Right flank tenderness, no rebound or guarding.  Normal bowel sounds. Musculoskeletal: Nontender with normal range of motion in extremities. No lower extremity tenderness nor edema. Neurologic:  Normal speech and language. No gross focal neurologic deficits are appreciated.  Skin:  Skin is warm, dry and intact. No rash noted. Psychiatric: Mood and affect are normal. Speech and behavior are normal.  ____________________________________________  ED COURSE:  As part of my medical decision making, I reviewed the following data within the [facility] History obtained from family if available, nursing notes, old chart and ekg, as well as notes from prior ED visits. Patient presented for right flank pain, we will assess with labs and imaging as indicated at this time.   Procedures ____________________________________________   LABS (pertinent positives/negatives)  Labs Reviewed  COMPREHENSIVE METABOLIC PANEL - Abnormal; Notable for the following components:      Result Value   Sodium 133 (*)    Glucose, Bld 120 (*)    All other components within normal limits  CBC - Abnormal; Notable for the following components:   WBC 22.5 (*)    RBC 3.71 (*)    HCT 35.7 (*)    All other components within normal limits  URINALYSIS, COMPLETE (UACMP) WITH MICROSCOPIC - Abnormal; Notable for the following components:   Color, Urine YELLOW (*)    APPearance CLEAR (*)    Hgb urine dipstick SMALL (*)    All other components within normal limits  CULTURE, BLOOD (ROUTINE X 2)  CULTURE, BLOOD (ROUTINE X 2)  LIPASE, BLOOD  LACTIC ACID, PLASMA   CRITICAL CARE Performed by: Laurence Aly   Total critical  care time: 30 minutes  Critical care time was exclusive of separately billable procedures and treating other patients.  Critical care was necessary to treat or prevent imminent or life-threatening deterioration.  Critical care was time spent personally by me on the following activities: development of treatment plan with patient and/or surrogate as well as nursing, discussions with consultants, evaluation of patient's response to treatment, examination of patient, obtaining history from patient or surrogate, ordering and performing treatments and interventions, ordering and review of laboratory studies, ordering and review of radiographic studies, pulse oximetry and re-evaluation of patient's condition.  RADIOLOGY Images were viewed by me  CT the abdomen pelvis with contrast IMPRESSION: 1. Inflammatory process in the RIGHT lower quadrant / RIGHT adnexal region. RIGHT ovary is poorly defined and difficult to separate from the small bowel but concern for enlargement of the RIGHT  ovary as described above. Differential for RIGHT adnexa process would include RIGHT TUBO-OVARIAN ABSCESS AND OVARIAN TORSION TORSION. Recommend pelvic ultrasound for further evaluation. 2. Cecum has migrated into the LEFT upper quadrant. No evidence obstruction or volvulus. The migration of the bowel could potentially result in vascular compromise to the RIGHT ovary. 3. Post appendectomy and cholecystectomy. Dilatation of the common bile duct is similar to prior and favored benign post cholecystectomy dilatation. ____________________________________________   DIFFERENTIAL DIAGNOSIS   Renal colic, pyelonephritis, UTI, cholecystitis, appendicitis, Sirs, ovarian cyst, ovarian torsion  FINAL ASSESSMENT AND PLAN  Leukocytosis, right lower quadrant inflammatory process   Plan: The patient had presented for right flank pain. Patient's labs did indicate significant leukocytosis. Patient's imaging revealed a  nonspecific right lower quadrant inflammatory process which was not further differentiated either with ultrasound or MRI.  She may would benefit from interventional radiology drainage or aspiration of this site.  I have discussed with OB on-call as well as general surgery who would recommend medicine admission with perhaps subspecialty consultation.  Have ordered IV Zosyn for her.  No specific etiology has been discovered for this inflammatory process at this time.   Laurence Aly, MD    Note: This note was generated in part or whole with voice recognition software. Voice recognition is usually quite accurate but there are transcription errors that can and very often do occur. I apologize for any typographical errors that were not detected and corrected.     Earleen Newport, MD 12/30/18 2013

## 2018-12-30 NOTE — ED Notes (Signed)
See triage note  Presents with lower back pain  States pian is now moving into right side of abd  States pain started this am  Denies any fever  States she took several meds PTA and pain is easing off at present

## 2018-12-30 NOTE — Progress Notes (Signed)
Pharmacy Antibiotic Note  Patrecia Veiga is a 48 y.o. female admitted on 12/30/2018 with intra-abdominal infx cannot r/o tubo-ovarian abscess.  Pharmacy has been consulted for zosyn dosing.  Plan: Zosyn 3.375g IV q8h (4 hour infusion).  Height: 5\' 6"  (167.6 cm) Weight: 192 lb (87.1 kg) IBW/kg (Calculated) : 59.3  Temp (24hrs), Avg:98.2 F (36.8 C), Min:98.2 F (36.8 C), Max:98.2 F (36.8 C)  Recent Labs  Lab 12/30/18 1404 12/30/18 1459  WBC 22.5*  --   CREATININE 0.67  --   LATICACIDVEN  --  1.5    Estimated Creatinine Clearance: 95.6 mL/min (by C-G formula based on SCr of 0.67 mg/dL).    Allergies  Allergen Reactions  . Onion Anaphylaxis  . Coconut Flavor Hives  . Demerol [Meperidine] Hives    agitated   . Morphine And Related Hives    Agitated   . Phenergan [Promethazine Hcl]     Tremors/ spasms   . Stadol [Butorphanol] Hives    Increases BP  . Zofran [Ondansetron Hcl] Hives  . Imitrex [Sumatriptan] Palpitations    Thank you for allowing pharmacy to be a part of this patient's care.  Tobie Lords, PharmD, BCPS Clinical Pharmacist 12/30/2018

## 2018-12-31 DIAGNOSIS — R102 Pelvic and perineal pain: Secondary | ICD-10-CM | POA: Diagnosis not present

## 2018-12-31 DIAGNOSIS — R1031 Right lower quadrant pain: Secondary | ICD-10-CM | POA: Diagnosis not present

## 2018-12-31 LAB — CBC
HCT: 34.5 % — ABNORMAL LOW (ref 36.0–46.0)
Hemoglobin: 11.3 g/dL — ABNORMAL LOW (ref 12.0–15.0)
MCH: 32.3 pg (ref 26.0–34.0)
MCHC: 32.8 g/dL (ref 30.0–36.0)
MCV: 98.6 fL (ref 80.0–100.0)
NRBC: 0 % (ref 0.0–0.2)
Platelets: 259 10*3/uL (ref 150–400)
RBC: 3.5 MIL/uL — ABNORMAL LOW (ref 3.87–5.11)
RDW: 14.4 % (ref 11.5–15.5)
WBC: 12.1 10*3/uL — ABNORMAL HIGH (ref 4.0–10.5)

## 2018-12-31 LAB — BASIC METABOLIC PANEL
Anion gap: 6 (ref 5–15)
BUN: 8 mg/dL (ref 6–20)
CO2: 28 mmol/L (ref 22–32)
Calcium: 8.6 mg/dL — ABNORMAL LOW (ref 8.9–10.3)
Chloride: 104 mmol/L (ref 98–111)
Creatinine, Ser: 0.71 mg/dL (ref 0.44–1.00)
GFR calc Af Amer: >60 mL/min (ref >60)
GFR calc non Af Amer: >60 mL/min (ref >60)
Glucose, Bld: 92 mg/dL (ref 70–99)
Potassium: 3.5 mmol/L (ref 3.5–5.1)
Sodium: 138 mmol/L (ref 135–145)

## 2018-12-31 MED ORDER — METOCLOPRAMIDE HCL 5 MG/ML IJ SOLN: 5.0000 mg | Freq: Four times a day (QID) | INTRAMUSCULAR | Status: DC | PRN

## 2018-12-31 MED ORDER — ENOXAPARIN SODIUM 40 MG/0.4ML ~~LOC~~ SOLN: 40.0000 mg | SUBCUTANEOUS | Status: DC

## 2018-12-31 MED ORDER — DULOXETINE HCL 60 MG PO CPEP
60.0000 mg | ORAL_CAPSULE | Freq: Every day | ORAL | Status: DC
  Filled 2018-12-31 (×2): qty 1

## 2018-12-31 MED ORDER — AMOXICILLIN-POT CLAVULANATE 875-125 MG PO TABS: 1.0000 | ORAL_TABLET | Freq: Two times a day (BID) | ORAL | 0 refills | Status: DC

## 2018-12-31 MED ORDER — ACETAMINOPHEN 650 MG RE SUPP: 650.0000 mg | Freq: Four times a day (QID) | RECTAL | Status: DC | PRN

## 2018-12-31 MED ORDER — SODIUM CHLORIDE 0.9 % IV SOLN
INTRAVENOUS | Status: AC
  Administered 2018-12-31: 01:00:00 via INTRAVENOUS

## 2018-12-31 MED ORDER — LISINOPRIL 20 MG PO TABS
20.0000 mg | ORAL_TABLET | Freq: Every day | ORAL | Status: DC
  Filled 2018-12-31: qty 1

## 2018-12-31 MED ORDER — LISINOPRIL-HYDROCHLOROTHIAZIDE 20-25 MG PO TABS: 1.0000 | ORAL_TABLET | ORAL | Status: DC

## 2018-12-31 MED ORDER — ACETAMINOPHEN 325 MG PO TABS
650.0000 mg | ORAL_TABLET | Freq: Four times a day (QID) | ORAL | Status: DC | PRN
  Administered 2018-12-31: 650 mg via ORAL
  Filled 2018-12-31: qty 2

## 2018-12-31 MED ORDER — OXYCODONE HCL 5 MG PO TABS
5.0000 mg | ORAL_TABLET | ORAL | Status: DC | PRN
  Administered 2018-12-31 (×4): 5 mg via ORAL
  Filled 2018-12-31 (×4): qty 1

## 2018-12-31 MED ORDER — HYDROCHLOROTHIAZIDE 25 MG PO TABS: 25.0000 mg | ORAL_TABLET | Freq: Every day | ORAL | Status: DC

## 2018-12-31 NOTE — Consult Note (Signed)
Obstetrics & Gynecology History and Physical Note  Date of Consultation: 12/31/2018   Requesting Provider: Dr Benjie Karvonen (Hospitalist)  Primary OBGYN: None Primary Care Provider: Sallee Lange  Reason for Consultation: RLQ pain  History of Present Illness: Amy Logan is a 48 y.o.  (No LMP recorded. Patient has had a hysterectomy.), with the above CC. Pt has a recent h/o one day R sided pain that radiates to the RLQ, severe, improved with narcotics given in hospital, associated with some nausea when pain severe, no other associated findings. Pt has a h/o TLH 15 years ago.  She has had 4 subsequent surgeries for ovarian cysts, and says she has PCOS.  They never removed her ovaries.  Her last surgery was 7 years ago for this.  She has not had obvious sx's of menopause.  Denies hot flashes, night sweats, other.  She also has a prior h/o appendectomy, cholestectomy, abdominal wall mass excision.  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 Pt seen 3 mos ago for nausea and vomiting and some pain, told she may have had diverticulosis then.  A CT was done then without evidence for adnexal mass or cyst. She has a h/o SIRS as well.  WBC elevated on admission. She was admitted by hospitalist for pain management and evaluation.  ROS: A review of systems was performed and was complete and comprehensive, except as stated in the above HPI.  OBGYN History: As per HPI. OB History   No obstetric history on file.      Past Medical History: Past Medical History:  Diagnosis Date  . Anemia   . Anxiety   . Arthritis   . Bradycardia   . Cancer ([facility]) 1990   cervical cells stage 4  . Fibromyalgia   . History of hiatal hernia    SMALL  . History of kidney stones    H/O  . Hypertension   . Neuropathy     Past Surgical History: Past Surgical History:  Procedure Laterality Date  . ABDOMINAL HYSTERECTOMY    . APPENDECTOMY    . BREAST CYST ASPIRATION Left    neg  .  CHOLECYSTECTOMY    . COLONOSCOPY  2018  . cyst removed     thyroglossal duct cyst  . JOINT REPLACEMENT    . LIPOMA EXCISION Right 10/09/2018   NOT A LIPOMA.  EPIDERMAL INCLUSION CYST  . REPLACEMENT TOTAL KNEE     Right knee    Family History:  Family History  Problem Relation Age of Onset  . Diabetes Mother   . Heart failure Mother   . Asthma Mother   . Cancer Father   . Breast cancer Cousin 70       mat cousin   She denies any female cancers, bleeding or blood clotting disorders except as above.   Social History:  Social History   Socioeconomic History  . Marital status: Married    Spouse name: Not on file  . Number of children: Not on file  . Years of education: Not on file  . Highest education level: Not on file  Occupational History  . Not on file  Social Needs  . Financial resource strain: Not on file  . Food insecurity:    Worry: Not on file    Inability: Not on file  . Transportation needs:    Medical: Not on file    Non-medical: Not on file  Tobacco Use  . Smoking status: Current Every Day Smoker  Packs/day: 0.50    Years: 25.00    Pack years: 12.50    Types: Cigarettes  . Smokeless tobacco: Never Used  Substance and Sexual Activity  . Alcohol use: Yes    Alcohol/week: 0.0 standard drinks    Comment: social  . Drug use: No  . Sexual activity: Not on file  Lifestyle  . Physical activity:    Days per week: Not on file    Minutes per session: Not on file  . Stress: Not on file  Relationships  . Social connections:    Talks on phone: Not on file    Gets together: Not on file    Attends religious service: Not on file    Active member of club or organization: Not on file    Attends meetings of clubs or organizations: Not on file    Relationship status: Not on file  . Intimate partner violence:    Fear of current or ex partner: Not on file    Emotionally abused: Not on file    Physically abused: Not on file    Forced sexual activity: Not on  file  Other Topics Concern  . Not on file  Social History Narrative  . Not on file    Allergy: Allergies  Allergen Reactions  . Onion Anaphylaxis  . Coconut Flavor Hives  . Demerol [Meperidine] Hives    agitated   . Morphine And Related Hives    Agitated   . Phenergan [Promethazine Hcl]     Tremors/ spasms   . Stadol [Butorphanol] Hives    Increases BP  . Zofran [Ondansetron Hcl] Hives  . Imitrex [Sumatriptan] Palpitations    Current Outpatient Medications: Medications Prior to Admission  Medication Sig Dispense Refill Last Dose  . cetirizine (ZYRTEC) 10 MG tablet Take 10 mg by mouth daily as needed for allergies.   Unknown at PRN  . DULoxetine (CYMBALTA) 60 MG capsule Take 60 mg by mouth at bedtime.   12/29/2018 at 2000  . lisinopril-hydrochlorothiazide (PRINZIDE,ZESTORETIC) 20-25 MG tablet Take 1 tablet by mouth every morning.   12/30/2018 at 0800  . naproxen sodium (ALEVE) 220 MG tablet Take 220 mg by mouth 2 (two) times daily.   12/30/2018 at 0800  . traMADol (ULTRAM) 50 MG tablet Take 50 mg by mouth 2 (two) times daily.    12/30/2018 at [facility] Medications: Current Facility-Administered Medications  Medication Dose Route Frequency Provider Last Rate Last Dose  . 0.9 %  sodium chloride infusion   Intravenous Continuous Lance Coon, MD 75 mL/hr at 12/31/18 0453    . acetaminophen (TYLENOL) tablet 650 mg  650 mg Oral Q6H PRN Lance Coon, MD   650 mg at 12/31/18 0055   Or  . acetaminophen (TYLENOL) suppository 650 mg  650 mg Rectal Q6H PRN Lance Coon, MD      . DULoxetine (CYMBALTA) DR capsule 60 mg  60 mg Oral Corwin Levins, MD      . enoxaparin (LOVENOX) injection 40 mg  40 mg Subcutaneous Q24H Lance Coon, MD   Stopped at 12/31/18 0901  . lisinopril (PRINIVIL,ZESTRIL) tablet 20 mg  20 mg Oral Daily Lance Coon, MD       And  . hydrochlorothiazide (HYDRODIURIL) tablet 25 mg  25 mg Oral Daily Lance Coon, MD      . HYDROmorphone (DILAUDID)  injection 0.5 mg  0.5 mg Intravenous Q4H PRN Lance Coon, MD   0.5 mg at 12/31/18 0555  . metoCLOPramide (  REGLAN) injection 5 mg  5 mg Intravenous Q6H PRN Lance Coon, MD      . oxyCODONE (Oxy IR/ROXICODONE) immediate release tablet 5 mg  5 mg Oral Q4H PRN Lance Coon, MD   5 mg at 12/31/18 0455  . piperacillin-tazobactam (ZOSYN) IVPB 3.375 g  3.375 g Intravenous Driscilla Moats, MD   Stopped at 12/31/18 [phone]  . sodium chloride flush (NS) 0.9 % injection 3 mL  3 mL Intravenous Once Lance Coon, MD        Physical Exam: Vitals:   12/30/18 2141 12/31/18 0059 12/31/18 0451 12/31/18 0801  BP: 114/75 123/89 96/62 100/66  Pulse: (!) 58 (!) 54 (!) 57 62  Resp: 18  18 18   Temp:  97.9 F (36.6 C) 98.6 F (37 C) 98 F (36.7 C)  TempSrc:  Oral Oral Oral  SpO2: 99% 97% 96% 97%  Weight:      Height:        Temp:  [97.9 F (36.6 C)-98.6 F (37 C)] 98 F (36.7 C) (02/11 0801) Pulse Rate:  [54-87] 62 (02/11 0801) Resp:  [16-18] 18 (02/11 0801) BP: (96-136)/(62-89) 100/66 (02/11 0801) SpO2:  [96 %-99 %] 97 % (02/11 0801) Weight:  [87.1 kg] 87.1 kg (02/10 1401) I/O last 3 completed shifts: In: 53.7 [I.V.:3.7; IV Piggyback:50] Out: -  Total I/O In: 290 [I.V.:290] Out: 700 [Urine:700]  Intake/Output Summary (Last 24 hours) at 12/31/2018 0904 Last data filed at 12/31/2018 0845 Gross per 24 hour  Intake 343.64 ml  Output 700 ml  Net -356.36 ml    Body mass index is 30.99 kg/m. Constitutional: Well nourished, well developed female in no acute distress.  HEENT: normal Neck:  Supple, normal appearance, and no thyromegaly  Cardiovascular:Regular rate and rhythm.  No murmurs, rubs or gallops. Respiratory:  Clear to auscultation bilateral. Normal respiratory effort Abdomen: positive bowel sounds and no masses, hernias; diffusely  tender to palpation, non distended, no guarding Neuro: grossly intact Psych:  Normal mood and affect.  Skin:  Warm and dry.  MS: normal gait and normal  bilateral lower extremity strength/ROM/symmetry Lymphatic:  No inguinal lymphadenopathy.   Recent Labs  Lab 12/30/18 1404 12/31/18 0616  WBC 22.5* 12.1*  HGB 12.1 11.3*  HCT 35.7* 34.5*  PLT 287 259   Recent Labs  Lab 12/30/18 1404 12/31/18 0616  NA 133* 138  K 3.6 3.5  CL 100 104  CO2 24 28  BUN 12 8  CREATININE 0.67 0.71  CALCIUM 9.1 8.6*  PROT 7.4  --   BILITOT 0.7  --   ALKPHOS 46  --   ALT 14  --   AST 16  --   GLUCOSE 120* 92   Imaging:  Ultrasound independently reviewed/interpreted by self. Also, she has had CT abd/pelvis and MRI abd  Assessment: Ms. Herrig is a 48 y.o.  (No LMP recorded. 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  There are no cysts or ovaries seen on ultrasound (the most sensitive test for gyn pathology).  Concern for bowel and adhesive disease, with higher risks for exploratory surgery in this regards.  Recommend general surgery evaluation.Amy Applebaum, MD, [facility], [facility] 12/31/2018  9:04 AM Pager [phone]

## 2018-12-31 NOTE — Discharge Summary (Signed)
Amy Logan NAME: Amy Logan    MR#:  [id number]  DATE OF BIRTH:  [birth year]  DATE OF ADMISSION:  12/30/2018 ADMITTING PHYSICIAN: Lance Coon, MD  DATE OF DISCHARGE: 12/31/2018  PRIMARY CARE PHYSICIAN: Sallee Lange, NP    ADMISSION DIAGNOSIS:  Pelvic pain [R10.2] Intraabdominal fluid collection [R18.8] Leukocytosis, unspecified type [D72.829]  DISCHARGE DIAGNOSIS:  Principal Problem:   RLQ abdominal pain Active Problems:   Anxiety and depression   Hyperlipidemia   Hypertension   Leukocytosis   SECONDARY DIAGNOSIS:   Past Medical History:  Diagnosis Date  . Anemia   . Anxiety   . Arthritis   . Bradycardia   . Cancer ([facility]) 1990   cervical cells stage 4  . Fibromyalgia   . History of hiatal hernia    SMALL  . History of kidney stones    H/O  . Hypertension   . Neuropathy     HOSPITAL COURSE:   48 year old female with history of fibromyalgia who presented to the emergency room due to right lower quadrant abdominal pain.  1.  Right lower quadrant abdominal pain: Patient underwent CT of the abdomen, pelvic ultrasound and MRI.  She was evaluated by GYN as well as surgery. Patient is currently nontender and tolerating diet.  According to surgery the findings were discussed with radiology and it was felt to be of adnexal origin. OB/GYN does not feel that this is a GYN issue. Patient symptoms have improved. I will have her follow-up with OB GYN next week. Continue antibiotics at discharge.  2.  Essential hypertension: Continue lisinopril/HCTZ  3.  Fibromyalgia: Continue Cymbalta  4. Tobacco dependence: Patient is encouraged to quit smoking. Counseling was provided for 4 minutes.   DISCHARGE CONDITIONS AND DIET:   Stable for discharge on regular diet  CONSULTS OBTAINED:  Treatment Team:  Jules Husbands, MD  DRUG ALLERGIES:   Allergies  Allergen Reactions  . Onion Anaphylaxis  . Coconut  Flavor Hives  . Demerol [Meperidine] Hives    agitated   . Morphine And Related Hives    Agitated   . Phenergan [Promethazine Hcl]     Tremors/ spasms   . Stadol [Butorphanol] Hives    Increases BP  . Zofran [Ondansetron Hcl] Hives  . Imitrex [Sumatriptan] Palpitations    DISCHARGE MEDICATIONS:   Allergies as of 12/31/2018      Reactions   Onion Anaphylaxis   Coconut Flavor Hives   Demerol [meperidine] Hives   agitated    Morphine And Related Hives   Agitated    Phenergan [promethazine Hcl]    Tremors/ spasms    Stadol [butorphanol] Hives   Increases BP   Zofran [ondansetron Hcl] Hives   Imitrex [sumatriptan] Palpitations      Medication List    TAKE these medications   cetirizine 10 MG tablet Commonly known as:  ZYRTEC Take 10 mg by mouth daily as needed for allergies.   DULoxetine 60 MG capsule Commonly known as:  CYMBALTA Take 60 mg by mouth at bedtime.   lisinopril-hydrochlorothiazide 20-25 MG tablet Commonly known as:  PRINZIDE,ZESTORETIC Take 1 tablet by mouth every morning.   naproxen sodium 220 MG tablet Commonly known as:  ALEVE Take 220 mg by mouth 2 (two) times daily.   traMADol 50 MG tablet Commonly known as:  ULTRAM Take 50 mg by mouth 2 (two) times daily.         Today   CHIEF COMPLAINT:  Abdominal pain is improved    VITAL SIGNS:  Blood pressure 114/73, pulse (!) 55, temperature 98.6 F (37 C), temperature source Oral, resp. rate 18, height 5\' 6"  (1.676 m), weight 87.1 kg, SpO2 98 %.   REVIEW OF SYSTEMS:  Review of Systems  Constitutional: Negative.  Negative for chills, fever and malaise/fatigue.  HENT: Negative.  Negative for ear discharge, ear pain, hearing loss, nosebleeds and sore throat.   Eyes: Negative.  Negative for blurred vision and pain.  Respiratory: Negative.  Negative for cough, hemoptysis, shortness of breath and wheezing.   Cardiovascular: Negative.  Negative for chest pain, palpitations and leg swelling.   Gastrointestinal: Negative.  Negative for abdominal pain, blood in stool, diarrhea, nausea and vomiting.  Genitourinary: Negative.  Negative for dysuria.  Musculoskeletal: Negative.  Negative for back pain.  Skin: Negative.   Neurological: Negative for dizziness, tremors, speech change, focal weakness, seizures and headaches.  Endo/Heme/Allergies: Negative.  Does not bruise/bleed easily.  Psychiatric/Behavioral: Negative.  Negative for depression, hallucinations and suicidal ideas.     PHYSICAL EXAMINATION:  GENERAL:  48 y.o.-year-old patient lying in the bed with no acute distress.  NECK:  Supple, no jugular venous distention. No thyroid enlargement, no tenderness.  LUNGS: Normal breath sounds bilaterally, no wheezing, rales,rhonchi  No use of accessory muscles of respiration.  CARDIOVASCULAR: S1, S2 normal. No murmurs, rubs, or gallops.  ABDOMEN: Soft, non-tender, non-distended. Bowel sounds present. No organomegaly or mass.  EXTREMITIES: No pedal edema, cyanosis, or clubbing.  PSYCHIATRIC: The patient is alert and oriented x 3.  SKIN: No obvious rash, lesion, or ulcer.   DATA REVIEW:   CBC Recent Labs  Lab 12/31/18 0616  WBC 12.1*  HGB 11.3*  HCT 34.5*  PLT 259    Chemistries  Recent Labs  Lab 12/30/18 1404 12/31/18 0616  NA 133* 138  K 3.6 3.5  CL 100 104  CO2 24 28  GLUCOSE 120* 92  BUN 12 8  CREATININE 0.67 0.71  CALCIUM 9.1 8.6*  AST 16  --   ALT 14  --   ALKPHOS 46  --   BILITOT 0.7  --     Cardiac Enzymes No results for input(s): TROPONINI in the last 168 hours.  Microbiology Results  @[email or web address] @  RADIOLOGY:  Mr Abdomen W Or Wo Contrast  Result Date: 12/30/2018 CLINICAL DATA:  Right-sided abdominal pain.  "Abdominal infection". EXAM: MRI ABDOMEN WITHOUT AND WITH CONTRAST TECHNIQUE: Multiplanar multisequence MR imaging of the abdomen was performed both before and after the administration of intravenous contrast. CONTRAST:  7.5 cc Gadavist  COMPARISON:  Today's pelvic ultrasound and abdominopelvic CTs, dictated separately. FINDINGS: Portions of exam are minimally motion degraded. Lower chest: Small hiatal hernia. Normal heart size without pericardial or pleural effusion. Hepatobiliary: Hepatomegaly, 22.4 cm craniocaudal. No focal liver lesion. Cholecystectomy. Common duct upper normal for prior cholecystectomy. Example at 10 mm in the porta hepatis. No obstructive stone or mass. Pancreas:  Normal, without mass or ductal dilatation. Spleen:  Normal in size, without focal abnormality. Adrenals/Urinary Tract: Normal adrenal glands. Normal kidneys, without hydronephrosis. Stomach/Bowel: Normal distal stomach and abdominal bowel loops. Vascular/Lymphatic: Normal caliber of the aorta and branch vessels. No abdominal adenopathy. Other: Small volume right-sided abdominal fluid, including image 36/4. Musculoskeletal: No acute osseous abnormality. IMPRESSION: 1. Small volume abdominal fluid, as detailed on CT. 2. Otherwise, no acute process in the abdomen. 3. Hepatomegaly. 4. Cholecystectomy with borderline common duct dilatation, most likely within normal variation. No cause identified. 5. Small  hiatal hernia. Electronically Signed   By: Abigail Miyamoto M.D.   On: 12/30/2018 20:06   US Pelvis Transvanginal Non-ob (tv Only)  Result Date: 12/30/2018 CLINICAL DATA:  Right-sided pelvic pain post hysterectomy. Possible torsion. EXAM: TRANSABDOMINAL AND TRANSVAGINAL ULTRASOUND OF PELVIS TECHNIQUE: Both transabdominal and transvaginal ultrasound examinations of the pelvis were performed. Transabdominal technique was performed for global imaging of the pelvis including uterus, ovaries, adnexal regions, and pelvic cul-de-sac. It was necessary to proceed with endovaginal exam following the transabdominal exam to visualize the ovaries. COMPARISON:  Abdominopelvic CT same date and 10/14/2018 FINDINGS: Uterus Measurements: Surgically absent. Right ovary Measurements: Not  visualized.  No adnexal mass seen. Left ovary Measurements: Not visualized.  No adnexal mass seen. Other findings A moderate amount of free pelvic fluid is present. IMPRESSION: 1. Neither ovary is visualized post hysterectomy. 2. Etiology for the right adnexal enlargement seen on earlier CT is not further clarified. 3. Nonspecific moderate free pelvic fluid. Electronically Signed   By: Richardean Sale M.D.   On: 12/30/2018 17:04   US Pelvis Complete  Result Date: 12/30/2018 CLINICAL DATA:  Right-sided pelvic pain post hysterectomy. Possible torsion. EXAM: TRANSABDOMINAL AND TRANSVAGINAL ULTRASOUND OF PELVIS TECHNIQUE: Both transabdominal and transvaginal ultrasound examinations of the pelvis were performed. Transabdominal technique was performed for global imaging of the pelvis including uterus, ovaries, adnexal regions, and pelvic cul-de-sac. It was necessary to proceed with endovaginal exam following the transabdominal exam to visualize the ovaries. COMPARISON:  Abdominopelvic CT same date and 10/14/2018 FINDINGS: Uterus Measurements: Surgically absent. Right ovary Measurements: Not visualized.  No adnexal mass seen. Left ovary Measurements: Not visualized.  No adnexal mass seen. Other findings A moderate amount of free pelvic fluid is present. IMPRESSION: 1. Neither ovary is visualized post hysterectomy. 2. Etiology for the right adnexal enlargement seen on earlier CT is not further clarified. 3. Nonspecific moderate free pelvic fluid. Electronically Signed   By: Richardean Sale M.D.   On: 12/30/2018 17:04   Ct Abdomen Pelvis W Contrast  Result Date: 12/30/2018 CLINICAL DATA:  Abdominal pain.  Leukocytosis. EXAM: CT ABDOMEN AND PELVIS WITH CONTRAST TECHNIQUE: Multidetector CT imaging of the abdomen and pelvis was performed using the standard protocol following bolus administration of intravenous contrast. CONTRAST:  119mL OMNIPAQUE IOHEXOL 300 MG/ML  SOLN COMPARISON:  CT 10/14/2018 FINDINGS: Lower chest:  Lung bases are clear. Hepatobiliary: Postcholecystectomy. Mild prominence of intrahepatic bile ducts similar comparison to comparison exam. The common bile duct measures 9 mm (image 31/2 which compares to 9 mm on comparison exam. Pancreas: No pancreatic duct dilatation. No pancreatic inflammation. Spleen: Normal spleen Adrenals/urinary tract: Adrenal glands and kidneys are normal. The ureters and bladder normal. Stomach/Bowel: Stomach, small-bowel cecum normal. The cecum has been migrated into the LEFT upper quadrant (image 23/2). No evidence cecal dilatation or obstruction. Post appendectomy. There is some fluid along the RIGHT lower quadrant which is moderately high-density just above simple fluid density. This fluid extends in the RIGHT pelvis. Ascending, transverse and descending colon normal. Rectum normal. Several diverticular the descending colon without acute inflammation Vascular/Lymphatic: Abdominal aorta is normal caliber. No periportal or retroperitoneal adenopathy. No pelvic adenopathy. Reproductive: Post hysterectomy. There is fluid in the RIGHT adnexal region. The RIGHT ovary is difficult to identify. On previous there are clips adjacent adjacent to the RIGHT ovary. There is ovoid soft tissue mass in this same region measuring 4.7 by 3.0 cm by 6.0 (Image 67/7, image 46/5). There is fluid adjacent to this rounded soft tissue mass which  is high-density. This mass lesion is immediately associated with the small bowel loops. Fluid extends to the pelvis. The LEFT ovary is not identified.  Post hysterectomy. Other: No intraperitoneal free air. Musculoskeletal: No aggressive osseous lesion. IMPRESSION: 1. Inflammatory process in the RIGHT lower quadrant / RIGHT adnexal region. RIGHT ovary is poorly defined and difficult to separate from the small bowel but concern for enlargement of the RIGHT ovary as described above. Differential for RIGHT adnexa process would include RIGHT TUBO-OVARIAN ABSCESS AND OVARIAN  TORSION TORSION. Recommend pelvic ultrasound for further evaluation. 2. Cecum has migrated into the LEFT upper quadrant. No evidence obstruction or volvulus. The migration of the bowel could potentially result in vascular compromise to the RIGHT ovary. 3. Post appendectomy and cholecystectomy. Dilatation of the common bile duct is similar to prior and favored benign post cholecystectomy dilatation. Electronically Signed   By: Suzy Bouchard M.D.   On: 12/30/2018 15:43      Allergies as of 12/31/2018      Reactions   Onion Anaphylaxis   Coconut Flavor Hives   Demerol [meperidine] Hives   agitated    Morphine And Related Hives   Agitated    Phenergan [promethazine Hcl]    Tremors/ spasms    Stadol [butorphanol] Hives   Increases BP   Zofran [ondansetron Hcl] Hives   Imitrex [sumatriptan] Palpitations      Medication List    TAKE these medications   cetirizine 10 MG tablet Commonly known as:  ZYRTEC Take 10 mg by mouth daily as needed for allergies.   DULoxetine 60 MG capsule Commonly known as:  CYMBALTA Take 60 mg by mouth at bedtime.   lisinopril-hydrochlorothiazide 20-25 MG tablet Commonly known as:  PRINZIDE,ZESTORETIC Take 1 tablet by mouth every morning.   naproxen sodium 220 MG tablet Commonly known as:  ALEVE Take 220 mg by mouth 2 (two) times daily.   traMADol 50 MG tablet Commonly known as:  ULTRAM Take 50 mg by mouth 2 (two) times daily.         Management plans discussed with the patient and she is in agreement. Stable for discharge home  Patient should follow up with obgyn  CODE STATUS:     Code Status Orders  (From admission, onward)         Start     Ordered   12/31/18 0032  Full code  Continuous     12/31/18 0031        Code Status History    Date Active Date Inactive Code Status Order ID Comments User Context   10/14/2018 0854 10/15/2018 1135 Full Code [id number]  Max Sane, MD Inpatient      TOTAL TIME TAKING CARE OF THIS  PATIENT: 38 minutes.    Note: This dictation was prepared with Dragon dictation along with smaller phrase technology. Any transcriptional errors that result from this process are unintentional.  Courtez Twaddle M.D on 12/31/2018 at 12:44 PM  Between 7am to 6pm - Pager - [phone] After 6pm go to [email or web address] - password [email or web address] [facility]  Office  [phone]  CC: Primary care physician; Dayton Martes [name], NP

## 2018-12-31 NOTE — Discharge Instructions (Signed)

## 2018-12-31 NOTE — Consult Note (Signed)
[facility] SURGICAL CONSULTATION NOTE (initial) - cpt: 44034   HISTORY OF PRESENT ILLNESS (HPI):  48 y.o. female presented to [facility] ED yesterday 12/31/2018 for evaluation of abdominal pain. Patient reported a 1 day history of right flank and RLQ abdominal pain which she described as a sharp pain. She had associated nausea and emesis with this pain. She denied any fevers, chills, CP, SOB, or bowel changes. She has a history of similar pain and presentations in the past and was treated conservatively with ABx and pain control and got better. She feels this presentation is similar to that time. She was worked up in the ED and there was concern ofor potential right adnexal torsion vs abscess as well as some concern for adhesive disease in the abdomen.   This afternoon, she reports her pain and nausea/emesis has resolved and she is tolerating a diet.  Surgery is consulted by hospitalist physician Dr. Bettey Costa, MD in this context for evaluation and management of RLQ abdominal pain.   PAST MEDICAL HISTORY (PMH):  Past Medical History:  Diagnosis Date  . Anemia   . Anxiety   . Arthritis   . Bradycardia   . Cancer ([place]) 1990   cervical cells stage 4  . Fibromyalgia   . History of hiatal hernia    SMALL  . History of kidney stones    H/O  . Hypertension   . Neuropathy      PAST SURGICAL HISTORY ([place]):  Past Surgical History:  Procedure Laterality Date  . ABDOMINAL HYSTERECTOMY    . APPENDECTOMY    . BREAST CYST ASPIRATION Left    neg  . CHOLECYSTECTOMY    . COLONOSCOPY  2018  . cyst removed     thyroglossal duct cyst  . JOINT REPLACEMENT    . LIPOMA EXCISION Right 10/09/2018   NOT A LIPOMA.  EPIDERMAL INCLUSION CYST  . REPLACEMENT TOTAL KNEE     Right knee     MEDICATIONS:  Prior to Admission medications   Medication Sig Start Date End Date Taking? Authorizing Provider  cetirizine (ZYRTEC) 10 MG tablet Take 10 mg by mouth daily as needed for allergies.    Yes [provider]  DULoxetine (CYMBALTA) 60 MG capsule Take 60 mg by mouth at bedtime.   Yes [provider]  lisinopril-hydrochlorothiazide (PRINZIDE,ZESTORETIC) 20-25 MG tablet Take 1 tablet by mouth every morning.   Yes [provider]  naproxen sodium (ALEVE) 220 MG tablet Take 220 mg by mouth 2 (two) times daily.   Yes [provider]  traMADol (ULTRAM) 50 MG tablet Take 50 mg by mouth 2 (two) times daily.  11/22/18  Yes [provider]     ALLERGIES:  Allergies  Allergen Reactions  . Onion Anaphylaxis  . Coconut Flavor Hives  . Demerol [Meperidine] Hives    agitated   . Morphine And Related Hives    Agitated   . Phenergan [Promethazine Hcl]     Tremors/ spasms   . Stadol [Butorphanol] Hives    Increases BP  . Zofran [Ondansetron Hcl] Hives  . Imitrex [Sumatriptan] Palpitations     SOCIAL HISTORY:  Social History   Socioeconomic History  . Marital status: Married    Spouse name: Not on file  . Number of children: Not on file  . Years of education: Not on file  . Highest education level: Not on file  Occupational History  . Not on file  Social Needs  . Financial resource strain:  Not on file  . Food insecurity:    Worry: Not on file    Inability: Not on file  . Transportation needs:    Medical: Not on file    Non-medical: Not on file  Tobacco Use  . Smoking status: Current Every Day Smoker    Packs/day: 0.50    Years: 25.00    Pack years: 12.50    Types: Cigarettes  . Smokeless tobacco: Never Used  Substance and Sexual Activity  . Alcohol use: Yes    Alcohol/week: 0.0 standard drinks    Comment: social  . Drug use: No  . Sexual activity: Not on file  Lifestyle  . Physical activity:    Days per week: Not on file    Minutes per session: Not on file  . Stress: Not on file  Relationships  . Social connections:    Talks on phone: Not on file    Gets together: Not on file    Attends religious service: Not on  file    Active member of club or organization: Not on file    Attends meetings of clubs or organizations: Not on file    Relationship status: Not on file  . Intimate partner violence:    Fear of current or ex partner: Not on file    Emotionally abused: Not on file    Physically abused: Not on file    Forced sexual activity: Not on file  Other Topics Concern  . Not on file  Social History Narrative  . Not on file     FAMILY HISTORY:  Family History  Problem Relation Age of Onset  . Diabetes Mother   . Heart failure Mother   . Asthma Mother   . Cancer Father   . Breast cancer Cousin 43       mat cousin      REVIEW OF SYSTEMS:  Review of Systems  Constitutional: Negative for chills and fever.  Respiratory: Negative for cough and sputum production.   Cardiovascular: Negative for chest pain and palpitations.  Gastrointestinal: Positive for abdominal pain, nausea and vomiting. Negative for constipation and diarrhea.  Genitourinary: Negative for dysuria, hematuria and urgency.  Neurological: Negative for dizziness and headaches.  All other systems reviewed and are negative.   VITAL SIGNS:  Temp:  [97.9 F (36.6 C)-98.6 F (37 C)] 98.6 F (37 C) (02/11 1223) Pulse Rate:  [54-87] 55 (02/11 1223) Resp:  [16-18] 18 (02/11 1223) BP: (96-136)/(62-89) 114/73 (02/11 1223) SpO2:  [96 %-99 %] 98 % (02/11 1223) Weight:  [87.1 kg] 87.1 kg (02/10 1401)     Height: 5\' 6"  (167.6 cm) Weight: 87.1 kg BMI (Calculated): 31   INTAKE/OUTPUT:  This shift: Total I/O In: 396.8 [I.V.:364; IV Piggyback:32.8] Out: 850 [Urine:850]  Last 2 shifts: @IOLAST2SHIFTS @   PHYSICAL EXAM:  Physical Exam Vitals signs and nursing note reviewed.  Constitutional:      General: She is not in acute distress.    Appearance: Normal appearance. She is obese. She is not ill-appearing.  HENT:     Head: Normocephalic and atraumatic.  Eyes:     General: No scleral icterus.    Pupils: Pupils are equal, round,  and reactive to light.  Cardiovascular:     Rate and Rhythm: Normal rate and regular rhythm.     Pulses: Normal pulses.     Heart sounds: No murmur. No friction rub. No gallop.   Pulmonary:     Effort: Pulmonary effort is normal. No  respiratory distress.     Breath sounds: Normal breath sounds. No wheezing or rhonchi.  Abdominal:     General: Abdomen is protuberant. A surgical scar is present. There is no distension.     Palpations: Abdomen is soft.     Tenderness: There is no abdominal tenderness. There is no guarding or rebound.     Comments: Multiple healed previous surgical incisions  Genitourinary:    Comments: Deferred Skin:    General: Skin is warm and dry.     Coloration: Skin is not jaundiced or pale.  Neurological:     General: No focal deficit present.     Mental Status: She is alert. She is disoriented.  Psychiatric:        Mood and Affect: Mood normal.        Behavior: Behavior normal.       Labs:  CBC Latest Ref Rng & Units 12/31/2018 12/30/2018 10/15/2018  WBC 4.0 - 10.5 K/uL 12.1(H) 22.5(H) 11.4(H)  Hemoglobin 12.0 - 15.0 g/dL 11.3(L) 12.1 12.8  Hematocrit 36.0 - 46.0 % 34.5(L) 35.7(L) 38.8  Platelets 150 - 400 K/uL 259 287 269   CMP Latest Ref Rng & Units 12/31/2018 12/30/2018 10/15/2018  Glucose 70 - 99 mg/dL 92 120(H) 105(H)  BUN 6 - 20 mg/dL 8 12 9   Creatinine 0.44 - 1.00 mg/dL 0.71 0.67 0.68  Sodium 135 - 145 mmol/L 138 133(L) 140  Potassium 3.5 - 5.1 mmol/L 3.5 3.6 3.4(L)  Chloride 98 - 111 mmol/L 104 100 105  CO2 22 - 32 mmol/L 28 24 25   Calcium 8.9 - 10.3 mg/dL 8.6(L) 9.1 8.0(L)  Total Protein 6.5 - 8.1 g/dL - 7.4 -  Total Bilirubin 0.3 - 1.2 mg/dL - 0.7 -  Alkaline Phos 38 - 126 U/L - 46 -  AST 15 - 41 U/L - 16 -  ALT 0 - 44 U/L - 14 -     Imaging studies:   CT Abdomen/Pelvis (012/08/2019) personally reviewed and radiologist report reviewed:  IMPRESSION: 1. Inflammatory process in the RIGHT lower quadrant / RIGHT adnexal region. RIGHT  ovary is poorly defined and difficult to separate from the small bowel but concern for enlargement of the RIGHT ovary as described above. Differential for RIGHT adnexa process would include RIGHT TUBO-OVARIAN ABSCESS AND OVARIAN TORSION TORSION. Recommend pelvic ultrasound for further evaluation. 2. Cecum has migrated into the LEFT upper quadrant. No evidence obstruction or volvulus. The migration of the bowel could potentially result in vascular compromise to the RIGHT ovary. 3. Post appendectomy and cholecystectomy. Dilatation of the common bile duct is similar to prior and favored benign post cholecystectomy dilatation.  MR Abdomen (12/30/2018) personally reviewed and radiologist report reviewed:  IMPRESSION: 1. Small volume abdominal fluid, as detailed on CT. 2. Otherwise, no acute process in the abdomen. 3. Hepatomegaly. 4. Cholecystectomy with borderline common duct dilatation, most likely within normal variation. No cause identified. 5. Small hiatal hernia.    Assessment/Plan:  48 y.o. female with resolved RLQ abdominal pain and improving leukocytosis.    Discussed radiologic findings with Dr Dahlia Byes and Dr Pascal Lux (radioogy) and this was felt to be of adnexal origin.   On assessment, patient is non-tender and tolerating a diet.   She was offered MRI of the pelvis to further evaluate the area of concern. However she declined.   No indication for emergent surgical intervention.   Agree with OB/GYN assessment and plan for pain control and ABx.   Disposition per Hospitalist.   Thank  you for the opportunity to participate in this patient's care.   -- Edison Simon, PA-C [facility] 12/31/2018, 12:27 PM [phone] M-F: 7am - 4pm

## 2018-12-31 NOTE — Progress Notes (Signed)
Patient discharged home. Discharge instructions, prescriptions and follow up appointment given to and reviewed with patient. Patient verbalized understanding. Patient wheeled out by auxiliary.  

## 2018-12-31 NOTE — ED Notes (Signed)
Report called to beth rn floor nurse 

## 2019-01-04 LAB — CULTURE, BLOOD (ROUTINE X 2)
Culture: NO GROWTH
Culture: NO GROWTH
Special Requests: ADEQUATE
Special Requests: ADEQUATE

## 2019-03-09 ENCOUNTER — Other Ambulatory Visit: Payer: Self-pay

## 2019-03-09 ENCOUNTER — Ambulatory Visit
Admission: EM | Admit: 2019-03-09 | Discharge: 2019-03-09 | Disposition: A | Discharge status: Home / Self Care | Payer: 59

## 2019-03-09 DIAGNOSIS — M25562 Pain in left knee: Secondary | ICD-10-CM

## 2019-03-09 DIAGNOSIS — I10 Essential (primary) hypertension: Secondary | ICD-10-CM | POA: Diagnosis not present

## 2019-03-09 DIAGNOSIS — S86912A Strain of unspecified muscle(s) and tendon(s) at lower leg level, left leg, initial encounter: Secondary | ICD-10-CM

## 2019-03-09 NOTE — Discharge Instructions (Signed)
Ice, elevation and knee sleeve for pain control.  Aleve twice a day, take with food.  Follow up with orthopedics for persistent symptoms as may need further evaluation and treatment.  Please return to be seen or go to er if develop numbness tingling, redness, swelling, chest pain , or shortness of breath.

## 2019-03-09 NOTE — ED Provider Notes (Signed)
[facility]    CSN: [id number] Arrival date & time: 03/09/19  1211     History   Chief Complaint Chief Complaint  Patient presents with  . Leg Pain    HPI Amy Logan is a 48 y.o. female.   Amy Logan presents with complaints of left knee pain which has been ongoing since about 4/7, but worse over the past three days and last night even worse. States she is getting evaluated for potential psoriatic arthritis. However last night she stepped down at work and had increased pain with "tight" sensation to posterior and anterior knee, worse with weight bearing and worse with flexion. Denies any previous similar. No specific other injury. Pain 9/10 in severity. She feels it has had some swelling, no redness. No fevers. Has some burning to posterior knee. Feels improved when knee is in flexion. No recent travel or immobilization, no hormone therapy, no chest pain , no shortness of breath . She does smoke. Hx of arthritis, has has knee replaced to right knee in the past. Hx of fibromyalgia. Took tizanidine at 6:30 this am and took ibuprofen at 3:30 this am which didn't seem to help with the pain. No numbness or tingling.    ROS per HPI, negative if not otherwise mentioned.      Past Medical History:  Diagnosis Date  . Anemia   . Anxiety   . Arthritis   . Bradycardia   . Cancer ([place]) 1990   cervical cells stage 4  . Fibromyalgia   . History of hiatal hernia    SMALL  . History of kidney stones    H/O  . Hypertension   . Neuropathy     Patient Active Problem List   Diagnosis Date Noted  . RLQ abdominal pain 12/30/2018  . Leukocytosis 12/30/2018  . Anxiety and depression 10/24/2018  . Arthritis 10/24/2018  . Chronic, continuous use of opioids 10/24/2018  . Chronic insomnia 10/24/2018  . Hyperlipidemia 10/24/2018  . Hypertension 10/24/2018  . Neuropathy 10/24/2018  . Obesity (BMI 30-39.9) 10/24/2018  . H/O excision of epidermal inclusion cyst 10/24/2018  .  SIRS (systemic inflammatory response syndrome) ([place]) 10/14/2018  . Epidermal inclusion cyst   . Mass of soft tissue of abdomen 09/30/2018  . B12 deficiency 11/20/2016  . Vitamin D insufficiency 11/20/2016  . Hx of normocytic normochromic anemia 12/20/2015  . Incomplete tear of right rotator cuff 11/21/2015    Past Surgical History:  Procedure Laterality Date  . ABDOMINAL HYSTERECTOMY    . APPENDECTOMY    . BREAST CYST ASPIRATION Left    neg  . CHOLECYSTECTOMY    . COLONOSCOPY  2018  . cyst removed     thyroglossal duct cyst  . JOINT REPLACEMENT    . LIPOMA EXCISION Right 10/09/2018   NOT A LIPOMA.  EPIDERMAL INCLUSION CYST  . REPLACEMENT TOTAL KNEE     Right knee    OB History   No obstetric history on file.      Home Medications    Prior to Admission medications   Medication Sig Start Date End Date Taking? Authorizing Provider  tiZANidine (ZANAFLEX) 4 MG capsule Take 4 mg by mouth 3 (three) times daily.   Yes [provider]  amoxicillin-clavulanate (AUGMENTIN) 875-125 MG tablet Take 1 tablet by mouth 2 (two) times daily. 12/31/18   Bettey Costa, MD  cetirizine (ZYRTEC) 10 MG tablet Take 10 mg by mouth daily as needed for allergies.    [provider]  DULoxetine (CYMBALTA) 60 MG capsule Take 60 mg by mouth at bedtime.    [provider]  lisinopril-hydrochlorothiazide (PRINZIDE,ZESTORETIC) 20-25 MG tablet Take 1 tablet by mouth every morning.    [provider]  naproxen sodium (ALEVE) 220 MG tablet Take 220 mg by mouth 2 (two) times daily.    [provider]  traMADol (ULTRAM) 50 MG tablet Take 50 mg by mouth 2 (two) times daily.  11/22/18   [provider]    Family History Family History  Problem Relation Age of Onset  . Diabetes Mother   . Heart failure Mother   . Asthma Mother   . Cancer Father   . Breast cancer Cousin 27       mat cousin    Social History Social History   Tobacco Use  . Smoking  status: Current Every Day Smoker    Packs/day: 0.50    Years: 25.00    Pack years: 12.50    Types: Cigarettes  . Smokeless tobacco: Never Used  Substance Use Topics  . Alcohol use: Yes    Alcohol/week: 0.0 standard drinks    Comment: social  . Drug use: No     Allergies   Onion; Coconut flavor; Demerol [meperidine]; Morphine and related; Phenergan [promethazine hcl]; Stadol [butorphanol]; Zofran [ondansetron hcl]; and Imitrex [sumatriptan]   Review of Systems Review of Systems   Physical Exam Triage Vital Signs ED Triage Vitals  Enc Vitals Group     BP 03/09/19 1237 (!) 138/95     Pulse Rate 03/09/19 1237 87     Resp 03/09/19 1237 16     Temp 03/09/19 1237 98.1 F (36.7 C)     Temp Source 03/09/19 1237 Oral     SpO2 03/09/19 1237 99 %     Weight 03/09/19 1239 190 lb (86.2 kg)     Height 03/09/19 1239 5' 6.5" (1.689 m)     Head Circumference --      Peak Flow --      Pain Score 03/09/19 1237 9     Pain Loc --      Pain Edu? --      Excl. in [place]? --    No data found.  Updated Vital Signs BP (!) 138/95 (BP Location: Right Arm)   Pulse 87   Temp 98.1 F (36.7 C) (Oral)   Resp 16   Ht 5' 6.5" (1.689 m)   Wt 190 lb (86.2 kg)   SpO2 99%   BMI 30.21 kg/m   Visual Acuity Right Eye Distance:   Left Eye Distance:   Bilateral Distance:    Right Eye Near:   Left Eye Near:    Bilateral Near:     Physical Exam Constitutional:      General: She is not in acute distress.    Appearance: She is well-developed.  Cardiovascular:     Rate and Rhythm: Normal rate and regular rhythm.     Heart sounds: Normal heart sounds.  Pulmonary:     Effort: Pulmonary effort is normal.     Breath sounds: Normal breath sounds.  Musculoskeletal:     Left knee: She exhibits decreased range of motion. She exhibits no swelling, no effusion, no ecchymosis, no deformity, no laceration, no erythema, normal alignment, no LCL laxity, normal patellar mobility, no bony tenderness and no MCL  laxity.       Legs:     Comments: Inconsistent left posterior knee tenderness on palpation; left anterior proximal tib/fib  with muscular tenderness; no obvious effusion or swelling; no redness, no bruising; no obvious laxity; strong pedal pulse; sensation intact; pain to distal hamstring with left knee flexion; no obvious palpable cyst present; negative homans   Skin:    General: Skin is warm and dry.  Neurological:     Mental Status: She is alert and oriented to person, place, and time.      UC Treatments / Results  Labs (all labs ordered are listed, but only abnormal results are displayed) Labs Reviewed - No data to display  EKG None  Radiology No results found.  Procedures Procedures (including critical care time)  Medications Ordered in UC Medications - No data to display  Initial Impression / Assessment and Plan / UC Course  I have reviewed the triage vital signs and the nursing notes.  Pertinent labs & imaging results that were available during my care of the patient were reviewed by me and considered in my medical decision making (see chart for details).     Findings inconsistent with dvt at this time, hr normal, no chest pain , no shortness of breath . Tendonitis vs meniscal strain vs muscle strain vs arthritis vs ruptured bakers cyst considered at this time. Continue with nsaids. Brace for support provided. Return precautions provided. Follow up with orthopedics for further evaluation if symptoms persist. Patient verbalized understanding and agreeable to plan.    Final Clinical Impressions(s) / UC Diagnoses   Final diagnoses:  Knee strain, left, initial encounter  Acute pain of left knee     Discharge Instructions     Ice, elevation and knee sleeve for pain control.  Aleve twice a day, take with food.  Follow up with orthopedics for persistent symptoms as may need further evaluation and treatment.  Please return to be seen or go to er if develop numbness  tingling, redness, swelling, chest pain , or shortness of breath.     ED Prescriptions    None     Controlled Substance Prescriptions [place] Controlled Substance Registry consulted? Not Applicable   Zigmund Gottron, NP 03/09/19 1320

## 2019-03-09 NOTE — ED Triage Notes (Addendum)
Pt states she has had left leg/knee soreness and last night at went to take a step and felt sudden pain in left knee. Pain from knee all the way down lower left leg and states she is unable to bend it

## 2019-03-20 DIAGNOSIS — R5382 Chronic fatigue, unspecified: Secondary | ICD-10-CM | POA: Insufficient documentation

## 2019-03-20 DIAGNOSIS — D72825 Bandemia: Secondary | ICD-10-CM | POA: Insufficient documentation

## 2019-08-28 ENCOUNTER — Ambulatory Visit: Payer: Self-pay | Admitting: Family Medicine

## 2019-12-07 IMAGING — CT CT ABD-PELV W/ CM
2 of 5 series · 15 of 46 positions shown, 17 images · IV contrast (APPLIED)
Comparison: None.

CLINICAL DATA: Abdominal infection. Right-sided abdominal AP Jumper
removed this week. Now with nausea and vomiting.

EXAM:
CT ABDOMEN AND PELVIS WITH CONTRAST
TECHNIQUE: Multidetector CT imaging of the abdomen and pelvis was performed
using the standard protocol following bolus administration of
intravenous contrast.
CONTRAST:  100mL TZRKSQ-UGG IOPAMIDOL (TZRKSQ-UGG) INJECTION 61%

[Series 2: routine abd/pel with · axial · 0.87mm/px · z∈[-937,-542]mm · 12 of 93 slices shown, 14 images]
[im 7/93  soft-tissue]
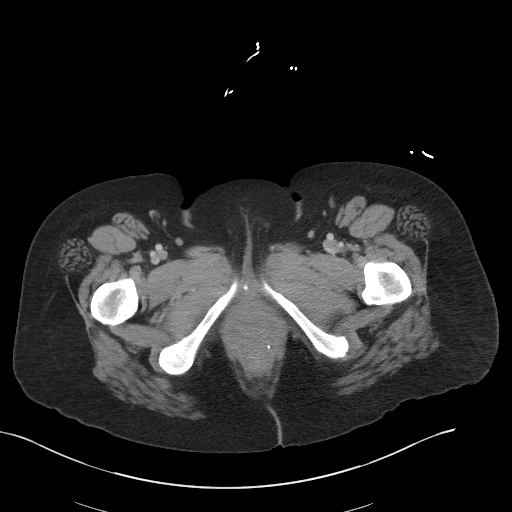
[im 7/93  bone]
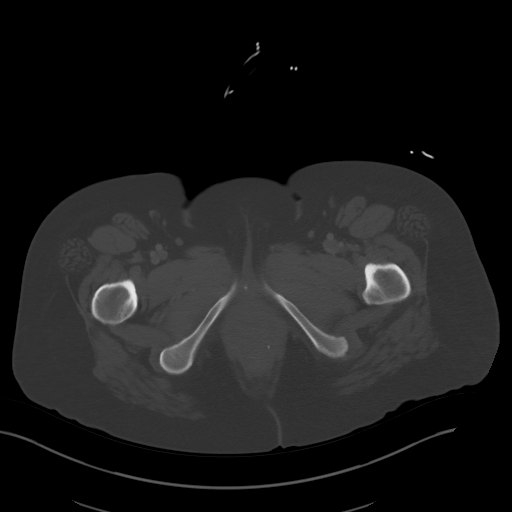
[im 14/93  soft-tissue]
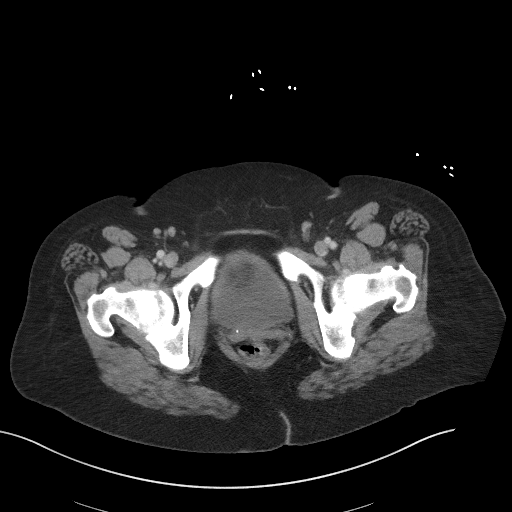
[im 20/93  soft-tissue]
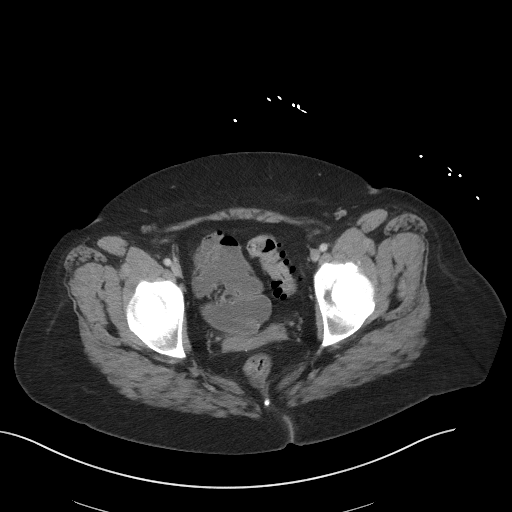
[im 27/93  soft-tissue]
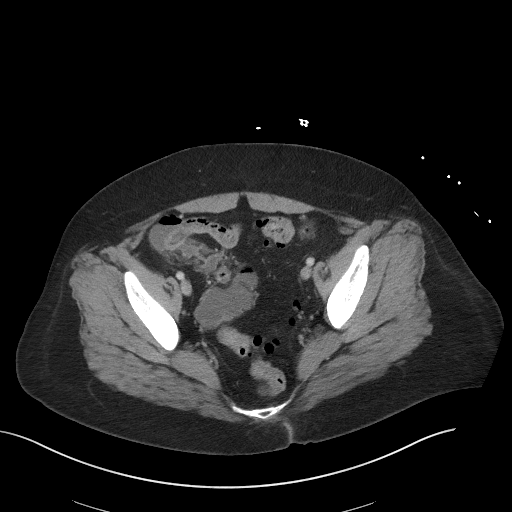
[im 33/93  soft-tissue]
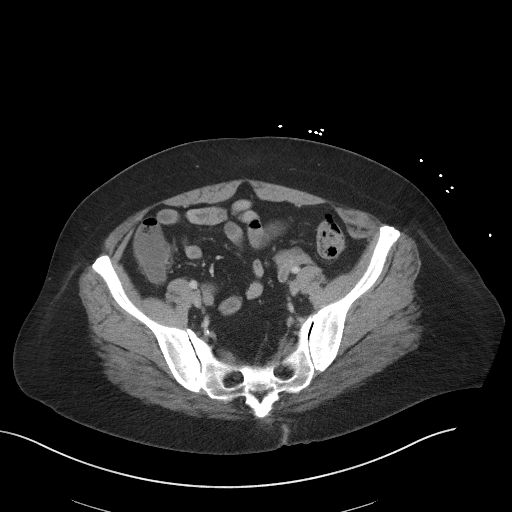
[im 40/93  soft-tissue]
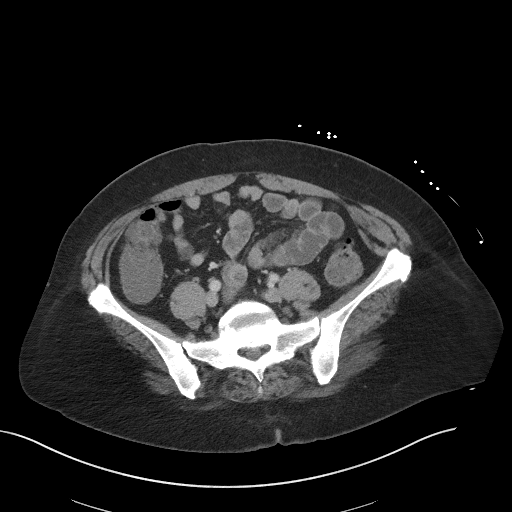
[im 53/93  soft-tissue]
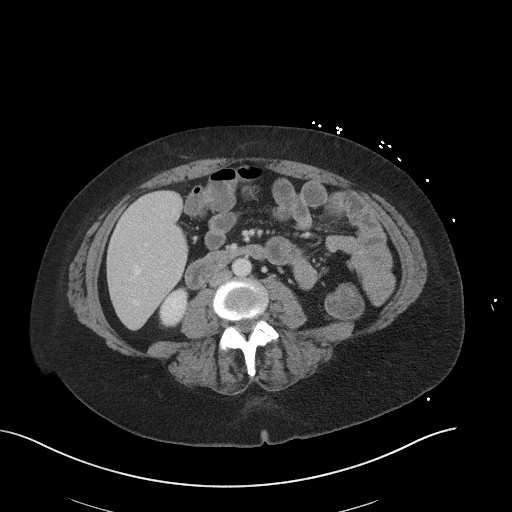
[im 60/93  soft-tissue]
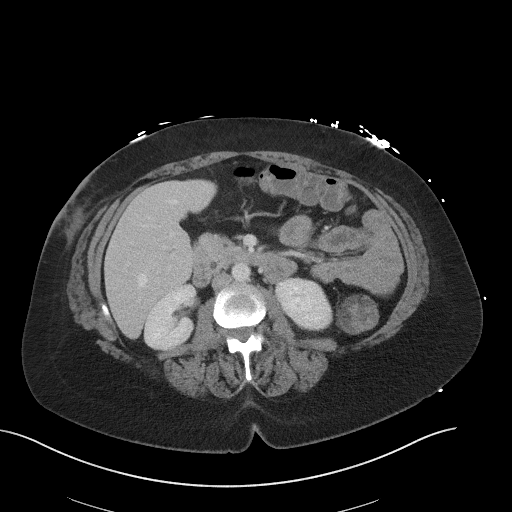
[im 66/93  soft-tissue]
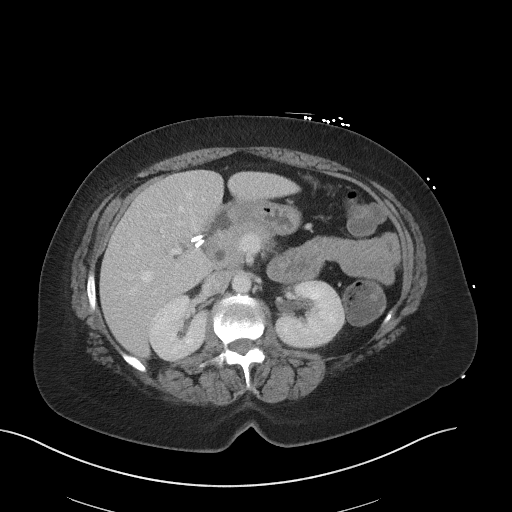
[im 66/93  bone]
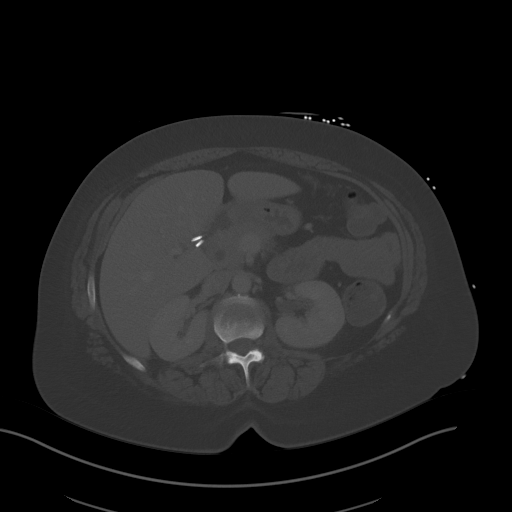
[im 73/93  soft-tissue]
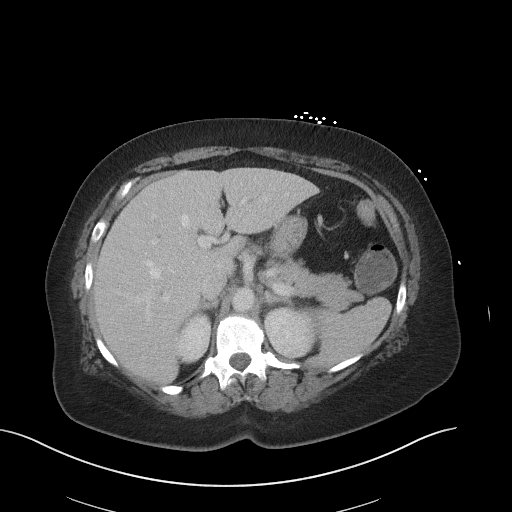
[im 79/93  soft-tissue]
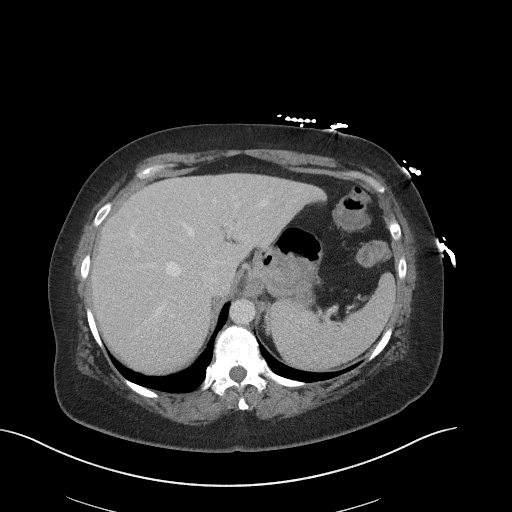
[im 86/93  soft-tissue]
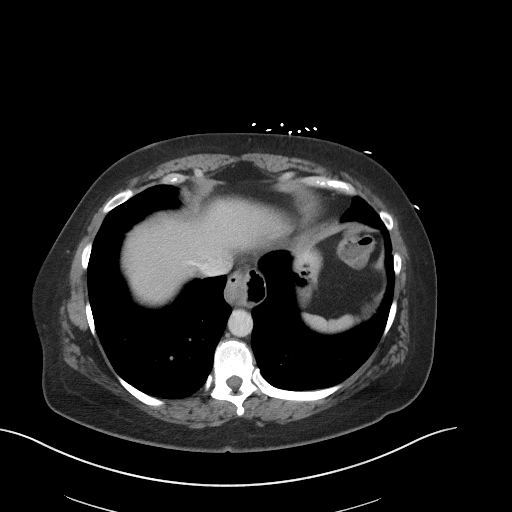

[Series 6: coronal st · coronal · 0.76mm/px · 3 of 90 slices shown]
[im 30/90  soft-tissue]
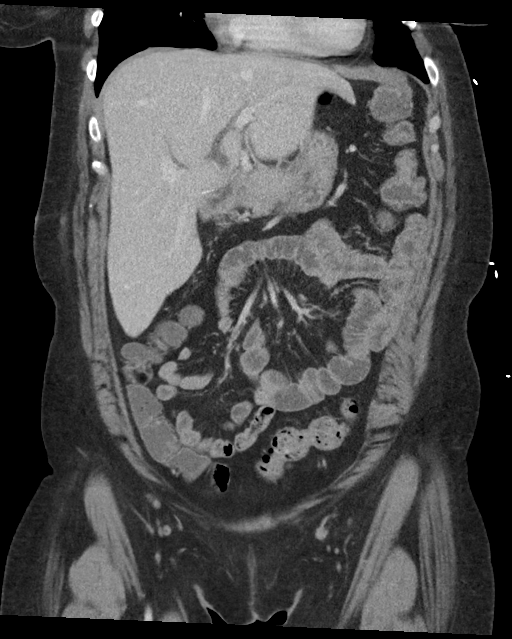
[im 40/90  soft-tissue]
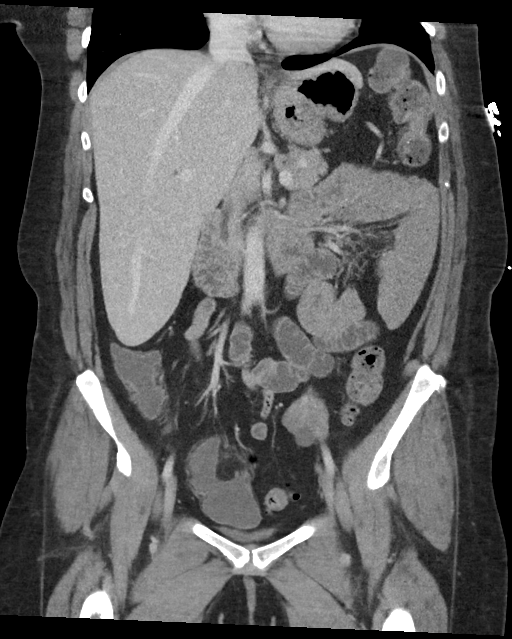
[im 50/90  soft-tissue]
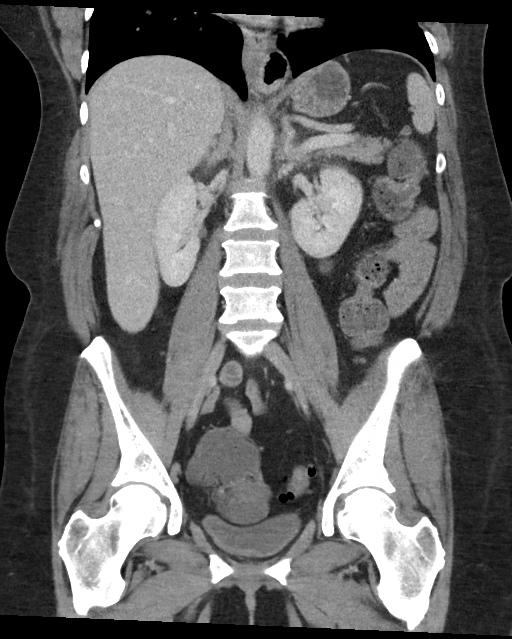

[15 of 46 positions shown; findings below may reference images not displayed]

FINDINGS: Lower chest: No pleural fluid or consolidation. Small hiatal hernia.

Hepatobiliary: No focal hepatic abnormality. Elongated right lobe of
the liver spanning 22.9 cm. Postcholecystectomy with mild
postsurgical dilatation of the biliary tree. No visualized
choledocholithiasis.

Pancreas: No ductal dilatation or inflammation.

Spleen: Normal in size without focal abnormality.

Adrenals/Urinary Tract: Normal adrenal glands. No hydronephrosis or
perinephric edema. Homogeneous renal enhancement with symmetric
excretion on delayed phase imaging. Urinary bladder is partially
distended without wall thickening.

Stomach/Bowel: Small hiatal hernia. Possible pre-pyloric gastric
wall thickening versus nondistention, lack of enteric contrast
limits detailed assessment. No small bowel dilatation, inflammation
or obstruction. Appendix not visualized, post appendectomy per
history. Fluid/liquid stool in the cecum and ascending colon without
colonic wall thickening or inflammatory change. Formed stool in the
more distal colon. Diverticulosis from the splenic flexure distally,
prominent in the distal descending and sigmoid. No diverticulitis.

Vascular/Lymphatic: Vascular structures are unremarkable. No
enlarged lymph nodes in the abdomen or pelvis.

Reproductive: Post hysterectomy. Surgical clips in the right adnexa,
right ovary tentatively identified and normal. Left ovary appears
quiescent. No suspicious adnexal mass.

Other: Skin thickening with mild subcutaneous edema about the right
lateral abdominal wall at the level of the the lower liver. Small
amount of simple fluid in the subcutaneous tissues likely small
postoperative seroma. No peripherally enhancing fluid collection or
subcutaneous air. No abdominopelvic ascites. Small fat containing
umbilical hernia.

Musculoskeletal: There are no acute or suspicious osseous
abnormalities. Prominent facet hypertrophy in the lower lumbar
spine.
IMPRESSION: 1. Post recent right abdominal wall lipoma excision with skin
thickening and small amount of edema and free fluid at the operative
bed. This is likely normal postoperative, no organized fluid
collection or soft tissue air.
2. Equivocal pre pyloric gastric wall thickening which may represent
gastritis versus nondistention.
3. Colonic diverticulosis without diverticulitis.

## 2020-01-15 ENCOUNTER — Other Ambulatory Visit: Payer: Self-pay

## 2020-01-15 ENCOUNTER — Ambulatory Visit
Admission: EM | Admit: 2020-01-15 | Discharge: 2020-01-15 | Disposition: A | Discharge status: Home / Self Care | Payer: Self-pay | Attending: Family Medicine | Admitting: Family Medicine

## 2020-01-15 ENCOUNTER — Encounter: Payer: Self-pay | Admitting: Emergency Medicine

## 2020-01-15 DIAGNOSIS — M25562 Pain in left knee: Secondary | ICD-10-CM

## 2020-01-15 DIAGNOSIS — L4050 Arthropathic psoriasis, unspecified: Secondary | ICD-10-CM

## 2020-01-15 MED ORDER — PREDNISONE 10 MG PO TABS: ORAL_TABLET | ORAL | 0 refills | Status: DC

## 2020-01-15 NOTE — ED Provider Notes (Signed)
[facility]    CSN: [id number] Arrival date & time: 01/15/20  1437  History   Chief Complaint Chief Complaint  Patient presents with  . Joint Pain   HPI  49 year old female presents with left knee pain.  Patient has known psoriatic arthritis.  She also has osteoarthritis and fibromyalgia.  She is off of her methotrexate.  She states that this was stopped due to side effects.  Patient reports that she now is without insurance.  She states that she has been experiencing left knee pain for the past few days.  She is having severe left knee pain.  Sick/10 in severity.  Worse with activity.  No relieving factors.  Patient is requesting steroids for psoriatic arthritis flare.  No other associated symptoms.  No other complaints.  Past Medical History:  Diagnosis Date  . Anemia   . Anxiety   . Arthritis   . Bradycardia   . Cancer ([place]) 1990   cervical cells stage 4  . Fibromyalgia   . History of hiatal hernia    SMALL  . History of kidney stones    H/O  . Hypertension   . Neuropathy     Patient Active Problem List   Diagnosis Date Noted  . RLQ abdominal pain 12/30/2018  . Leukocytosis 12/30/2018  . Anxiety and depression 10/24/2018  . Arthritis 10/24/2018  . Chronic, continuous use of opioids 10/24/2018  . Chronic insomnia 10/24/2018  . Hyperlipidemia 10/24/2018  . Hypertension 10/24/2018  . Neuropathy 10/24/2018  . Obesity (BMI 30-39.9) 10/24/2018  . H/O excision of epidermal inclusion cyst 10/24/2018  . SIRS (systemic inflammatory response syndrome) ([place]) 10/14/2018  . Epidermal inclusion cyst   . Mass of soft tissue of abdomen 09/30/2018  . B12 deficiency 11/20/2016  . Vitamin D insufficiency 11/20/2016  . Hx of normocytic normochromic anemia 12/20/2015  . Incomplete tear of right rotator cuff 11/21/2015    Past Surgical History:  Procedure Laterality Date  . ABDOMINAL HYSTERECTOMY    . APPENDECTOMY    . BREAST CYST ASPIRATION Left    neg  .  CHOLECYSTECTOMY    . COLONOSCOPY  2018  . cyst removed     thyroglossal duct cyst  . JOINT REPLACEMENT    . LIPOMA EXCISION Right 10/09/2018   NOT A LIPOMA.  EPIDERMAL INCLUSION CYST  . REPLACEMENT TOTAL KNEE     Right knee    OB History   No obstetric history on file.      Home Medications    Prior to Admission medications   Medication Sig Start Date End Date Taking? Authorizing Provider  cetirizine (ZYRTEC) 10 MG tablet Take 10 mg by mouth daily as needed for allergies.   Yes [provider]  DULoxetine (CYMBALTA) 60 MG capsule Take 60 mg by mouth at bedtime.   Yes [provider]  ibuprofen (ADVIL) 800 MG tablet Take 800 mg by mouth 2 (two) times daily as needed.   Yes [provider]  lisinopril-hydrochlorothiazide (PRINZIDE,ZESTORETIC) 20-25 MG tablet Take 1 tablet by mouth every morning.   Yes [provider]  traMADol (ULTRAM) 50 MG tablet Take 50 mg by mouth 2 (two) times daily.  11/22/18  Yes [provider]  triamcinolone (KENALOG) 0.025 % cream Apply 1 application topically 2 (two) times daily.   Yes [provider]  predniSONE (DELTASONE) 10 MG tablet 50 mg daily x 2 days, then 40 mg daily x 2 days, then 30 mg daily x 2 days, then  20 mg daily x 2 days, then 10 mg daily x 2 days. 01/15/20   Coral Spikes, DO    Family History Family History  Problem Relation Age of Onset  . Diabetes Mother   . Heart failure Mother   . Asthma Mother   . Cancer Father   . Breast cancer Cousin 81       mat cousin    Social History Social History   Tobacco Use  . Smoking status: Current Every Day Smoker    Packs/day: 0.50    Years: 25.00    Pack years: 12.50    Types: Cigarettes  . Smokeless tobacco: Never Used  Substance Use Topics  . Alcohol use: Yes    Alcohol/week: 0.0 standard drinks    Comment: social  . Drug use: No     Allergies   Onion, Coconut flavor, Demerol [meperidine], Morphine and related, Phenergan  [promethazine hcl], Stadol [butorphanol], Zofran [ondansetron hcl], and Imitrex [sumatriptan]   Review of Systems Review of Systems  Constitutional: Negative.   Musculoskeletal:       Left knee pain.   Physical Exam Triage Vital Signs ED Triage Vitals  Enc Vitals Group     BP 01/15/20 1506 (!) 147/101     Pulse Rate 01/15/20 1506 73     Resp 01/15/20 1506 18     Temp 01/15/20 1506 99.1 F (37.3 C)     Temp Source 01/15/20 1506 Oral     SpO2 01/15/20 1506 100 %     Weight 01/15/20 1503 212 lb (96.2 kg)     Height 01/15/20 1503 5\' 6"  (1.676 m)     Head Circumference --      Peak Flow --      Pain Score 01/15/20 1503 6     Pain Loc --      Pain Edu? --      Excl. in [place]? --    Updated Vital Signs BP (!) 147/101 (BP Location: Left Arm)   Pulse 73   Temp 99.1 F (37.3 C) (Oral)   Resp 18   Ht 5\' 6"  (1.676 m)   Wt 96.2 kg   SpO2 100%   BMI 34.22 kg/m   Visual Acuity Right Eye Distance:   Left Eye Distance:   Bilateral Distance:    Right Eye Near:   Left Eye Near:    Bilateral Near:     Physical Exam Constitutional:      General: She is not in acute distress.    Appearance: Normal appearance. She is obese. She is not ill-appearing.  Eyes:     General:        Right eye: No discharge.        Left eye: No discharge.     Conjunctiva/sclera: Conjunctivae normal.  Cardiovascular:     Rate and Rhythm: Normal rate and regular rhythm.     Heart sounds: No murmur.  Pulmonary:     Effort: Pulmonary effort is normal.     Breath sounds: Normal breath sounds. No wheezing, rhonchi or rales.  Musculoskeletal:     Comments: Left knee -tenderness on the medial aspect of the left knee.  No apparent effusion.  Ligaments intact.  Exam limited due to body habitus.  Neurological:     Mental Status: She is alert.  Psychiatric:        Mood and Affect: Mood normal.        Behavior: Behavior normal.    UC Treatments /  Results  Labs (all labs ordered are listed, but only  abnormal results are displayed) Labs Reviewed - No data to display  EKG   Radiology No results found.  Procedures Procedures (including critical care time)  Medications Ordered in UC Medications - No data to display  Initial Impression / Assessment and Plan / UC Course  I have reviewed the triage vital signs and the nursing notes.  Pertinent labs & imaging results that were available during my care of the patient were reviewed by me and considered in my medical decision making (see chart for details).    49 year old female presents with acute on chronic knee pain.  Exacerbation of psoriatic arthritis.  Treating with prednisone.  Supportive care.  Final Clinical Impressions(s) / UC Diagnoses   Final diagnoses:  Acute pain of left knee  Psoriatic arthritis [facility])     Discharge Instructions     Steroids as prescribed.  No need for antibiotics. There is not indication of infection.  Best of luck  Dr. Lacinda Axon    ED Prescriptions    Medication Sig Dispense Auth. Provider   predniSONE (DELTASONE) 10 MG tablet 50 mg daily x 2 days, then 40 mg daily x 2 days, then 30 mg daily x 2 days, then 20 mg daily x 2 days, then 10 mg daily x 2 days. 30 tablet Thersa Salt G, DO     I have reviewed the PDMP during this encounter.   Coral Spikes, [place] 01/15/20 1552

## 2020-01-15 NOTE — Discharge Instructions (Signed)
Steroids as prescribed.  No need for antibiotics. There is not indication of infection.  Best of luck  Dr. Lacinda Axon

## 2020-01-15 NOTE — ED Triage Notes (Signed)
Patient states she has psoriatic arthritis and states she was taken off her Methotrexate in October. She states she had lost her insurance and she can't see anyone until April. She states she is having a flare and it is affecting her left knee. She is requesting an antibiotic and steroids.

## 2020-04-02 ENCOUNTER — Other Ambulatory Visit: Payer: Self-pay | Admitting: Nurse Practitioner

## 2020-04-02 DIAGNOSIS — Z1231 Encounter for screening mammogram for malignant neoplasm of breast: Secondary | ICD-10-CM

## 2020-04-12 ENCOUNTER — Ambulatory Visit: Payer: Medicaid Other

## 2020-04-15 DIAGNOSIS — L409 Psoriasis, unspecified: Secondary | ICD-10-CM | POA: Insufficient documentation

## 2020-04-15 DIAGNOSIS — Z79899 Other long term (current) drug therapy: Secondary | ICD-10-CM | POA: Insufficient documentation

## 2020-05-16 ENCOUNTER — Ambulatory Visit
Admission: EM | Admit: 2020-05-16 | Discharge: 2020-05-16 | Disposition: A | Discharge status: Home / Self Care | Payer: 59 | Attending: Emergency Medicine | Admitting: Emergency Medicine

## 2020-05-16 ENCOUNTER — Encounter: Payer: Self-pay | Admitting: Emergency Medicine

## 2020-05-16 ENCOUNTER — Other Ambulatory Visit: Payer: Self-pay

## 2020-05-16 DIAGNOSIS — J0100 Acute maxillary sinusitis, unspecified: Secondary | ICD-10-CM

## 2020-05-16 MED ORDER — DOXYCYCLINE HYCLATE 100 MG PO CAPS: 100.0000 mg | ORAL_CAPSULE | Freq: Two times a day (BID) | ORAL | 0 refills | Status: DC

## 2020-05-16 NOTE — Discharge Instructions (Addendum)
You were seen for a sinus infection and are being treated for bacterial sinus infection.   Take the antibiotics as prescribed until they're finished. If you think you're having a reaction, stop the medication, take benadryl and go to the nearest urgent care/emergency room. Take a probiotic while taking the antibiotic to decrease the chances of stomach upset.   Use over-the-counter fluticasone propionate (Flonase) to help with the congestion of your sinuses.  Continue using your Zyrtec daily.  Take care, Dr. Marland Kitchen, NP-c

## 2020-05-16 NOTE — ED Triage Notes (Signed)
Patient c/o sinus pain and pressure, congestion and cough that started 3 weeks.  Patient denies fevers.

## 2020-05-16 NOTE — ED Provider Notes (Signed)
[facility] - [place], [place]   Name: Amy Logan DOB: October 25, 1971 MRN: [id number] CSN: [id number] PCP: Sallee Lange, NP  Arrival date and time:  05/16/20 1358  Chief Complaint:  Sinus Problem   NOTE: Prior to seeing the patient today, I have reviewed the triage nursing documentation and vital signs. Clinical staff has updated patient's PMH/PSHx, current medication list, and drug allergies/intolerances to ensure comprehensive history available to assist in medical decision making.   History:   HPI: Amy Logan is a 48 y.o. female who presents today with complaints of sinus pressure.  She states her symptoms started approximately 3 weeks ago.  She has been attempting to treat her symptoms with over-the-counter sinus/cold flu medication.  No relief was noted with those medications.  She denies any fevers.  She has noted pressure to bilateral maxillary sinuses and continuous drainage down her throat.  She has a productive cough with green/brown sputum.  She denies any pain to her ears.  No recent antibiotics.  She denies any exposure to any persons with probable COVID-19.   Past Medical History:  Diagnosis Date  . Anemia   . Anxiety   . Arthritis   . Bradycardia   . Cancer ([place]) 1990   cervical cells stage 4  . Fibromyalgia   . History of hiatal hernia    SMALL  . History of kidney stones    H/O  . Hypertension   . Neuropathy     Past Surgical History:  Procedure Laterality Date  . ABDOMINAL HYSTERECTOMY    . APPENDECTOMY    . BREAST CYST ASPIRATION Left    neg  . CHOLECYSTECTOMY    . COLONOSCOPY  2018  . cyst removed     thyroglossal duct cyst  . JOINT REPLACEMENT    . LIPOMA EXCISION Right 10/09/2018   NOT A LIPOMA.  EPIDERMAL INCLUSION CYST  . REPLACEMENT TOTAL KNEE     Right knee    Family History  Problem Relation Age of Onset  . Diabetes Mother   . Heart failure Mother   . Asthma Mother   . Cancer Father   . Breast cancer Cousin 68        mat cousin    Social History   Tobacco Use  . Smoking status: Current Every Day Smoker    Packs/day: 0.50    Years: 25.00    Pack years: 12.50    Types: Cigarettes  . Smokeless tobacco: Never Used  Vaping Use  . Vaping Use: Never used  Substance Use Topics  . Alcohol use: Yes    Alcohol/week: 0.0 standard drinks    Comment: social  . Drug use: No    Patient Active Problem List   Diagnosis Date Noted  . RLQ abdominal pain 12/30/2018  . Leukocytosis 12/30/2018  . Anxiety and depression 10/24/2018  . Arthritis 10/24/2018  . Chronic, continuous use of opioids 10/24/2018  . Chronic insomnia 10/24/2018  . Hyperlipidemia 10/24/2018  . Hypertension 10/24/2018  . Neuropathy 10/24/2018  . Obesity (BMI 30-39.9) 10/24/2018  . H/O excision of epidermal inclusion cyst 10/24/2018  . SIRS (systemic inflammatory response syndrome) ([place]) 10/14/2018  . Epidermal inclusion cyst   . Mass of soft tissue of abdomen 09/30/2018  . B12 deficiency 11/20/2016  . Vitamin D insufficiency 11/20/2016  . Hx of normocytic normochromic anemia 12/20/2015  . Incomplete tear of right rotator cuff 11/21/2015    Home Medications:    No outpatient medications have been marked  as taking for the 05/16/20 encounter [facility] Encounter).    Allergies:   Onion, Coconut flavor, Demerol [meperidine], Morphine and related, Phenergan [promethazine hcl], Stadol [butorphanol], Zofran [ondansetron hcl], and Imitrex [sumatriptan]  Review of Systems (ROS): Review of Systems  Constitutional: Positive for fatigue. Negative for activity change, appetite change, diaphoresis and fever.  HENT: Positive for congestion, postnasal drip, sinus pressure, sinus pain and sore throat. Negative for ear discharge, ear pain, facial swelling, hearing loss and sneezing.   Eyes: Negative for pain.  Respiratory: Positive for cough. Negative for shortness of breath and wheezing.   Gastrointestinal: Positive for nausea. Negative for  constipation and vomiting.  All other systems reviewed and are negative.    Vital Signs: Today's Vitals   05/16/20 1443 05/16/20 1446 05/16/20 1514  BP:  (!) 136/95   Pulse:  75   Resp:  16   Temp:  98.1 F (36.7 C)   TempSrc:  Oral   SpO2:  97%   Weight: 198 lb (89.8 kg)    Height: 5\' 6"  (1.676 m)    PainSc: 5   5     Physical Exam: Physical Exam Vitals and nursing note reviewed.  HENT:     Head: Normocephalic.     Right Ear: No middle ear effusion.     Left Ear: Tympanic membrane normal.  No middle ear effusion.     Nose: Congestion present.     Right Sinus: Maxillary sinus tenderness present. No frontal sinus tenderness.     Left Sinus: Maxillary sinus tenderness present. No frontal sinus tenderness.     Comments: Tenderness to left maxillary sinus greater than right    Mouth/Throat:     Mouth: Mucous membranes are moist.     Pharynx: Posterior oropharyngeal erythema present.     Tonsils: No tonsillar exudate or tonsillar abscesses.  Cardiovascular:     Rate and Rhythm: Normal rate and regular rhythm.     Heart sounds: Normal heart sounds.  Pulmonary:     Effort: Pulmonary effort is normal.     Breath sounds: Normal breath sounds.  Musculoskeletal:     Cervical back: Normal range of motion.  Lymphadenopathy:     Cervical: No cervical adenopathy.  Skin:    General: Skin is warm and dry.  Neurological:     Mental Status: She is alert.      Urgent Care Treatments / Results:   LABS: PLEASE NOTE: all labs that were ordered this encounter are listed, however only abnormal results are displayed. Labs Reviewed - No data to display  EKG: -None  RADIOLOGY: No results found.  PROCEDURES: Procedures  MEDICATIONS RECEIVED THIS VISIT: Medications - No data to display  PERTINENT CLINICAL COURSE NOTES/UPDATES:   Initial Impression / Assessment and Plan / Urgent Care Course:  Pertinent labs & imaging results that were available during my care of the patient  were personally reviewed by me and considered in my medical decision making (see lab/imaging section of note for values and interpretations).  Amy Logan is a 49 y.o. female who presents to [facility] today with complaints of sinus pressure, diagnosed with bacterial sinusitis, and treated as such with the medications below. NP and patient reviewed discharge instructions below during visit.   Patient is well appearing overall in clinic today. She does not appear to be in any acute distress. Presenting symptoms (see HPI) and exam as documented above.   I have reviewed the follow up and strict return precautions for any  new or worsening symptoms. Patient is aware of symptoms that would be deemed urgent/emergent, and would thus require further evaluation either here or in the emergency department. At the time of discharge, she verbalized understanding and consent with the discharge plan as it was reviewed with her. All questions were fielded by provider and/or clinic staff prior to patient discharge.    Final Clinical Impressions / Urgent Care Diagnoses:   Final diagnoses:  Acute non-recurrent maxillary sinusitis    New Prescriptions:  [place] Controlled Substance Registry consulted? Not Applicable  Meds ordered this encounter  Medications  . doxycycline (VIBRAMYCIN) 100 MG capsule    Sig: Take 1 capsule (100 mg total) by mouth 2 (two) times daily.    Dispense:  20 capsule    Refill:  0      Discharge Instructions     You were seen for a sinus infection and are being treated for bacterial sinus infection.   Take the antibiotics as prescribed until they're finished. If you think you're having a reaction, stop the medication, take benadryl and go to the nearest urgent care/emergency room. Take a probiotic while taking the antibiotic to decrease the chances of stomach upset.   Use over-the-counter fluticasone propionate (Flonase) to help with the congestion of your sinuses.  Continue  using your Zyrtec daily.  Take care, Dr. Marland Kitchen, NP-c     Recommended Follow up Care:  Patient encouraged to follow up with the following provider within the specified time frame, or sooner as dictated by the severity of her symptoms. As always, she was instructed that for any urgent/emergent care needs, she should seek care either here or in the emergency department for more immediate evaluation.   Gertie Baron, DNP, NP-c    Gertie Baron, NP 05/16/20 1711

## 2020-08-17 ENCOUNTER — Telehealth: Payer: Self-pay

## 2020-08-17 NOTE — Telephone Encounter (Signed)
Called to confirm appt. Instructed to bring H&P to appt and to bring list of medications. Patient with understanding.

## 2020-08-18 ENCOUNTER — Other Ambulatory Visit: Payer: Self-pay

## 2020-08-18 ENCOUNTER — Ambulatory Visit
Payer: 59 | Attending: Student in an Organized Health Care Education/Training Program | Admitting: Student in an Organized Health Care Education/Training Program

## 2020-08-18 ENCOUNTER — Encounter: Payer: Self-pay | Admitting: Student in an Organized Health Care Education/Training Program

## 2020-08-18 VITALS — BP 141/99 | HR 71 | Temp 97.9°F | Resp 16 | Ht 66.0 in | Wt 221.0 lb

## 2020-08-18 DIAGNOSIS — L4050 Arthropathic psoriasis, unspecified: Secondary | ICD-10-CM | POA: Insufficient documentation

## 2020-08-18 DIAGNOSIS — E669 Obesity, unspecified: Secondary | ICD-10-CM | POA: Insufficient documentation

## 2020-08-18 DIAGNOSIS — M25562 Pain in left knee: Secondary | ICD-10-CM | POA: Insufficient documentation

## 2020-08-18 DIAGNOSIS — G8929 Other chronic pain: Secondary | ICD-10-CM | POA: Insufficient documentation

## 2020-08-18 DIAGNOSIS — G894 Chronic pain syndrome: Secondary | ICD-10-CM | POA: Diagnosis present

## 2020-08-18 DIAGNOSIS — E538 Deficiency of other specified B group vitamins: Secondary | ICD-10-CM | POA: Diagnosis present

## 2020-08-18 DIAGNOSIS — M797 Fibromyalgia: Secondary | ICD-10-CM | POA: Insufficient documentation

## 2020-08-18 DIAGNOSIS — M1712 Unilateral primary osteoarthritis, left knee: Secondary | ICD-10-CM | POA: Diagnosis present

## 2020-08-18 DIAGNOSIS — Z96651 Presence of right artificial knee joint: Secondary | ICD-10-CM | POA: Diagnosis present

## 2020-08-18 MED ORDER — GABAPENTIN 300 MG PO CAPS: ORAL_CAPSULE | ORAL | 0 refills | Status: DC

## 2020-08-18 NOTE — Progress Notes (Signed)
Safety precautions to be maintained throughout the outpatient stay will include: orient to surroundings, keep bed in low position, maintain call bell within reach at all times, provide assistance with transfer out of bed and ambulation.  

## 2020-08-18 NOTE — Patient Instructions (Signed)

## 2020-08-18 NOTE — Progress Notes (Signed)
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   5. B12 deficiency   6. Obesity (BMI 30-39.9)   7. Chronic pain syndrome   8. Primary osteoarthritis of left knee    Plan of Care (Initial workup plan)  Note: Ms. Shorb was reminded that as per protocol, today's visit has  been an evaluation only. We have not taken over the patient's controlled substance management.  General Recommendations: The pain condition that the patient suffers from is best treated with a multidisciplinary approach that involves an increase in physical activity to prevent de-conditioning and worsening of the pain cycle, as well as psychological counseling (formal and/or informal) to address the co-morbid psychological affects of pain. Treatment will often involve judicious use of pain medications and interventional procedures to decrease the pain, allowing the patient to participate in  the physical activity that will ultimately produce long-lasting pain reductions. The goal of the multidisciplinary approach is to return the patient to a higher level of overall function and to restore their ability to perform activities of daily living.  1.  Urine toxicology screen today which is customary for new patients, can take over patient's tramadol at next visit so long as urine toxicology screen is appropriate 2.  Left knee Hyalgan injection for knee osteoarthritis 3.  Addition of gabapentin as below.  Continue Cymbalta as prescribed  4.  Continue rheumatologic care for psoriatic arthritis with Dr. Posey Pronto    Lab Orders     Compliance Drug Analysis, Ur   Procedure Orders     KNEE INJECTION Pharmacotherapy (current): Medications ordered:  Meds ordered this encounter  Medications  . gabapentin (NEURONTIN) 300 MG capsule    Sig: Take 1 capsule (300 mg total) by mouth at bedtime for 30 days, THEN 1 capsule (300 mg total) 2 (two) times daily.    Dispense:  90 capsule    Refill:  0   Medications administered during this visit: Ashika Apuzzo had no medications administered during this visit.   Pharmacological management options:  Opioid Analgesics: The patient was informed that there is no guarantee that she would be a candidate for opioid analgesics. The decision will be made following CDC guidelines. This decision will be based on the results of diagnostic studies, as well as Ms. Shimabukuro's risk profile.  Tramadol 50 mg twice daily to 3 times daily as needed  Membrane stabilizer: Trial of gabapentin as above.  Continue Cymbalta as prescribed  Muscle relaxant: To be determined at a later time  NSAID: To be determined at a later time  Other analgesic(s): To be determined at a later time   Interventional management options: Ms. Rosemeyer was informed that there is no guarantee that she would be a candidate for interventional therapies. The decision will be based on the results of diagnostic  studies, as well as Ms. Rougeau's risk profile.  Procedure(s) under consideration:   Left knee Hyalgan/viscosupplementation Left knee genicular nerve block and possible radiofrequency ablation Sprint axillary nerve peripheral nerve stimulation   Provider-requested follow-up: Return in about 1 week (around 08/25/2020) for left knee hyalgan #1 .  No future appointments.  Note by: Gillis Santa, MD Date: 08/18/2020; Time: 9:49 AM

## 2020-08-19 ENCOUNTER — Encounter: Payer: Self-pay | Admitting: Student in an Organized Health Care Education/Training Program

## 2020-08-24 LAB — COMPLIANCE DRUG ANALYSIS, UR

## 2020-09-13 ENCOUNTER — Encounter: Payer: Self-pay | Admitting: Student in an Organized Health Care Education/Training Program

## 2020-09-13 ENCOUNTER — Ambulatory Visit
Payer: 59 | Attending: Student in an Organized Health Care Education/Training Program | Admitting: Student in an Organized Health Care Education/Training Program

## 2020-09-13 ENCOUNTER — Other Ambulatory Visit: Payer: Self-pay

## 2020-09-13 VITALS — BP 144/102 | HR 72 | Temp 97.1°F | Resp 16 | Ht 66.0 in | Wt 221.0 lb

## 2020-09-13 DIAGNOSIS — Z79891 Long term (current) use of opiate analgesic: Secondary | ICD-10-CM | POA: Diagnosis not present

## 2020-09-13 DIAGNOSIS — M1712 Unilateral primary osteoarthritis, left knee: Secondary | ICD-10-CM

## 2020-09-13 DIAGNOSIS — G8929 Other chronic pain: Secondary | ICD-10-CM

## 2020-09-13 DIAGNOSIS — Z888 Allergy status to other drugs, medicaments and biological substances status: Secondary | ICD-10-CM | POA: Diagnosis not present

## 2020-09-13 DIAGNOSIS — Z90710 Acquired absence of both cervix and uterus: Secondary | ICD-10-CM | POA: Diagnosis not present

## 2020-09-13 DIAGNOSIS — Z0289 Encounter for other administrative examinations: Secondary | ICD-10-CM

## 2020-09-13 DIAGNOSIS — Z79899 Other long term (current) drug therapy: Secondary | ICD-10-CM | POA: Diagnosis not present

## 2020-09-13 DIAGNOSIS — M25562 Pain in left knee: Secondary | ICD-10-CM | POA: Diagnosis not present

## 2020-09-13 DIAGNOSIS — G894 Chronic pain syndrome: Secondary | ICD-10-CM | POA: Insufficient documentation

## 2020-09-13 DIAGNOSIS — Z9049 Acquired absence of other specified parts of digestive tract: Secondary | ICD-10-CM | POA: Diagnosis not present

## 2020-09-13 DIAGNOSIS — Z96659 Presence of unspecified artificial knee joint: Secondary | ICD-10-CM | POA: Insufficient documentation

## 2020-09-13 DIAGNOSIS — Z885 Allergy status to narcotic agent status: Secondary | ICD-10-CM | POA: Diagnosis not present

## 2020-09-13 MED ORDER — SODIUM HYALURONATE (VISCOSUP) 20 MG/2ML IX SOSY
2.0000 mL | PREFILLED_SYRINGE | Freq: Once | INTRA_ARTICULAR | Status: AC
  Administered 2020-09-13: 2 mL via INTRA_ARTICULAR

## 2020-09-13 MED ORDER — LIDOCAINE HCL 2 % IJ SOLN
INTRAMUSCULAR | Status: AC
  Filled 2020-09-13: qty 20

## 2020-09-13 MED ORDER — TRAMADOL HCL 50 MG PO TABS: 50.0000 mg | ORAL_TABLET | Freq: Two times a day (BID) | ORAL | 0 refills | Status: DC

## 2020-09-13 MED ORDER — LIDOCAINE HCL 2 % IJ SOLN: 20.0000 mL | Freq: Once | INTRAMUSCULAR | Status: DC

## 2020-09-13 NOTE — Progress Notes (Signed)
Safety precautions to be maintained throughout the outpatient stay will include: orient to surroundings, keep bed in low position, maintain call bell within reach at all times, provide assistance with transfer out of bed and ambulation.  

## 2020-09-13 NOTE — Progress Notes (Signed)
PROVIDER NOTE: Information contained herein reflects review and annotations entered in association with encounter. Interpretation of such information and data should be left to medically-trained personnel. Information provided to patient can be located elsewhere in the medical record under "Patient Instructions". Document created using STT-dictation technology, any transcriptional errors that may result from process are unintentional.    Patient: Amy Logan  Service Category: Procedure  Provider: Gillis Santa, MD  DOB: 01-20-1971  DOS: 09/13/2020  Location: [facility]  MRN: [id number]  Setting: Ambulatory - outpatient  Referring Provider: Gillis Santa, MD  Type: Established Patient  Specialty: Interventional Pain Management  PCP: Sallee Lange, NP   Primary Reason for Visit: Interventional Pain Management Treatment. CC: Knee Pain (left knee )  Procedure:          Anesthesia, Analgesia, Anxiolysis:  Type: Diagnostic Intra-Articular Hyalgan Knee Injection #1  Region: Medial infrapatellar Knee Region Level: Knee Joint Laterality: Left knee  Type: Local Anesthesia Indication(s): Analgesia         Local Anesthetic: Lidocaine 1-2% Route: Infiltration ([place]/IM) IV Access: Declined Sedation: Declined   Position: Sitting   Indications: 1. Chronic pain of left knee   2. Primary osteoarthritis of left knee   3. Pain management contract signed   4. Chronic pain syndrome    Pain Score: Pre-procedure: 7 /10 Post-procedure: 7 /10   Pre-op Assessment:  Ms. Radziewicz is a 49 y.o. (year old), female patient, seen today for interventional treatment. She  has a past surgical history that includes Cholecystectomy; Appendectomy; Replacement total knee; Abdominal hysterectomy; Joint replacement; Breast cyst aspiration (Left); Colonoscopy (2018); cyst removed; and Lipoma excision (Right, 10/09/2018). Ms. Appenzeller has a current medication list which includes the following prescription(s):  cetirizine, cyanocobalamin, duloxetine, folic acid, gabapentin, humira pen, ibuprofen, lisinopril-hydrochlorothiazide, methotrexate, omeprazole, rosuvastatin, triamcinolone, and [START ON 09/17/2020] tramadol, and the following Facility-Administered Medications: lidocaine. Her primarily concern today is the Knee Pain (left knee )  Initial Vital Signs:  Pulse/HCG Rate: 72  Temp: (!) 97.1 F (36.2 C) Resp: 16 BP: (!) 144/102 SpO2: 99 %  BMI: Estimated body mass index is 35.67 kg/m as calculated from the following:   Height as of this encounter: 5\' 6"  (1.676 m).   Weight as of this encounter: 221 lb (100.2 kg).  Risk Assessment: Allergies: Reviewed. She is allergic to onion, coconut flavor, demerol [meperidine], morphine and related, phenergan [promethazine hcl], stadol [butorphanol], zofran [ondansetron hcl], and imitrex [sumatriptan].  Allergy Precautions: None required Coagulopathies: Reviewed. None identified.  Blood-thinner therapy: None at this time Active Infection(s): Reviewed. None identified. Ms. Jakubowicz is afebrile  Site Confirmation: Ms. Cutbirth was asked to confirm the procedure and laterality before marking the site Procedure checklist: Completed Consent: Before the procedure and under the influence of no sedative(s), amnesic(s), or anxiolytics, the patient was informed of the treatment options, risks and possible complications. To fulfill our ethical and legal obligations, as recommended by the American Medical Association's Code of Ethics, I have informed the patient of my clinical impression; the nature and purpose of the treatment or procedure; the risks, benefits, and possible complications of the intervention; the alternatives, including doing nothing; the risk(s) and benefit(s) of the alternative treatment(s) or procedure(s); and the risk(s) and benefit(s) of doing nothing. The patient was provided information about the general risks and possible complications associated with  the procedure. These may include, but are not limited to: failure to achieve desired goals, infection, bleeding, organ or nerve damage, allergic reactions, paralysis, and death. In  addition, the patient was informed of those risks and complications associated to the procedure, such as failure to decrease pain; infection; bleeding; organ or nerve damage with subsequent damage to sensory, motor, and/or autonomic systems, resulting in permanent pain, numbness, and/or weakness of one or several areas of the body; allergic reactions; (i.e.: anaphylactic reaction); and/or death. Furthermore, the patient was informed of those risks and complications associated with the medications. These include, but are not limited to: allergic reactions (i.e.: anaphylactic or anaphylactoid reaction(s)); adrenal axis suppression; blood sugar elevation that in diabetics may result in ketoacidosis or comma; water retention that in patients with history of congestive heart failure may result in shortness of breath, pulmonary edema, and decompensation with resultant heart failure; weight gain; swelling or edema; medication-induced neural toxicity; particulate matter embolism and blood vessel occlusion with resultant organ, and/or nervous system infarction; and/or aseptic necrosis of one or more joints. Finally, the patient was informed that Medicine is not an exact science; therefore, there is also the possibility of unforeseen or unpredictable risks and/or possible complications that may result in a catastrophic outcome. The patient indicated having understood very clearly. We have given the patient no guarantees and we have made no promises. Enough time was given to the patient to ask questions, all of which were answered to the patient's satisfaction. Ms. Hajjar has indicated that she wanted to continue with the procedure. Attestation: I, the ordering provider, attest that I have discussed with the patient the benefits, risks,  side-effects, alternatives, likelihood of achieving goals, and potential problems during recovery for the procedure that I have provided informed consent. Date  Time: 09/13/2020  9:13 AM  Pre-Procedure Preparation:  Monitoring: As per clinic protocol. Respiration, ETCO2, SpO2, BP, heart rate and rhythm monitor placed and checked for adequate function Safety Precautions: Patient was assessed for positional comfort and pressure points before starting the procedure. Time-out: I initiated and conducted the "Time-out" before starting the procedure, as per protocol. The patient was asked to participate by confirming the accuracy of the "Time Out" information. Verification of the correct person, site, and procedure were performed and confirmed by me, the nursing staff, and the patient. "Time-out" conducted as per Joint Commission's Universal Protocol (UP.01.01.01). Time: 0935  Description of Procedure:          Target Area: Knee Joint Approach: Just above the Medial tibial plateau, lateral to the infrapatellar tendon. Area Prepped: Entire knee area, from the mid-thigh to the mid-shin. DuraPrep (Iodine Povacrylex [0.7% available iodine] and Isopropyl Alcohol, 74% w/w) Safety Precautions: Aspiration looking for blood return was conducted prior to all injections. At no point did we inject any substances, as a needle was being advanced. No attempts were made at seeking any paresthesias. Safe injection practices and needle disposal techniques used. Medications properly checked for expiration dates. SDV (single dose vial) medications used. Description of the Procedure: Protocol guidelines were followed. The patient was placed in position over the fluoroscopy table. The target area was identified and the area prepped in the usual manner. Skin & deeper tissues infiltrated with local anesthetic. Appropriate amount of time allowed to pass for local anesthetics to take effect. The procedure needles were then advanced  to the target area. Proper needle placement secured. Negative aspiration confirmed. Solution injected in intermittent fashion, asking for systemic symptoms every 0.5cc of injectate. The needles were then removed and the area cleansed, making sure to leave some of the prepping solution back to take advantage of its long term bactericidal properties. Vitals:  09/13/20 0921  BP: (!) 144/102  Pulse: 72  Resp: 16  Temp: (!) 97.1 F (36.2 C)  TempSrc: Temporal  SpO2: 99%  Weight: 221 lb (100.2 kg)  Height: 5\' 6"  (1.676 m)    Start Time: 0935 hrs. End Time:   hrs. Materials:  Needle(s) Type: Regular needle Gauge: 25G Length: 1.5-in Medication(s): Please see orders for medications and dosing details.  Post-operative Assessment:  Post-procedure Vital Signs:  Pulse/HCG Rate: 72  Temp: (!) 97.1 F (36.2 C) Resp: 16 BP: (!) 144/102 SpO2: 99 %  EBL: None  Complications: No immediate post-treatment complications observed by team, or reported by patient.  Note: The patient tolerated the entire procedure well. A repeat set of vitals were taken after the procedure and the patient was kept under observation following institutional policy, for this type of procedure. Post-procedural neurological assessment was performed, showing return to baseline, prior to discharge. The patient was provided with post-procedure discharge instructions, including a section on how to identify potential problems. Should any problems arise concerning this procedure, the patient was given instructions to immediately contact us, at any time, without hesitation. In any case, we plan to contact the patient by telephone for a follow-up status report regarding this interventional procedure.  Comments:  No additional relevant information.    Pharmacotherapy Assessment   Analgesic: Tramadol 50 mg twice daily as needed, quantity 60/month    Monitoring:  PMP: PDMP reviewed during this encounter.        Patient to  sign pain contract.  Tramadol was previously managed by her primary care provider but patient prefers to have this done with pain management.  Urine toxicology screen is appropriate.  We will have patient sign pain contract and take over her chronic opioid therapy in the form of tramadol 50 mg twice daily as below.  UDS:  Summary  Date Value Ref Range Status  08/19/2020 Note  Final    Comment:    ==================================================================== Compliance Drug Analysis, Ur ==================================================================== Test                             Result       Flag       Units  Drug Present and Declared for Prescription Verification   Tramadol                       293          EXPECTED   ng/mg creat   O-Desmethyltramadol            1042         EXPECTED   ng/mg creat   N-Desmethyltramadol            107          EXPECTED   ng/mg creat    Source of tramadol is a prescription medication. O-desmethyltramadol    and N-desmethyltramadol are expected metabolites of tramadol.  Drug Present not Declared for Prescription Verification   Naproxen                       PRESENT      UNEXPECTED   Diphenhydramine                PRESENT      UNEXPECTED  Drug Absent but Declared for Prescription Verification   Gabapentin  Not Detected UNEXPECTED   Duloxetine                     Not Detected UNEXPECTED   Ibuprofen                      Not Detected UNEXPECTED    Ibuprofen, as indicated in the declared medication list, is not    always detected even when used as directed.  ==================================================================== Test                      Result    Flag   Units      Ref Range   Creatinine              113              mg/dL      >=20 ==================================================================== Declared Medications:  The flagging and interpretation on this report are based on the  following declared  medications.  Unexpected results may arise from  inaccuracies in the declared medications.   **Note: The testing scope of this panel includes these medications:   Duloxetine (Cymbalta)  Gabapentin  Tramadol (Ultram)   **Note: The testing scope of this panel does not include small to  moderate amounts of these reported medications:   Ibuprofen (Advil)   **Note: The testing scope of this panel does not include the  following reported medications:   Adalimumab (Humira)  Cetirizine (Zyrtec)  Folic Acid  Hydrochlorothiazide  Lisinopril  Methotrexate  Omeprazole  Rosuvastatin  Triamcinolone (Kenalog)  Vitamin B12 ==================================================================== For clinical consultation, please call [phone]. ====================================================================       Plan of Care  Orders:  Orders Placed This Encounter  Procedures  . KNEE INJECTION    Hyalgan knee injection. Please order Hyalgan.    Standing Status:   Future    Standing Expiration Date:   10/14/2020    Scheduling Instructions:     Procedure: Intra-articular Hyalgan Knee injection            Side: Bilateral     Sedation: None     Timeframe: in 4 weeks    Order Specific Question:   Where will this procedure be performed?    Answer:   [facility]    Medications ordered for procedure: Meds ordered this encounter  Medications  . lidocaine (XYLOCAINE) 2 % (with pres) injection 400 mg  . Sodium Hyaluronate SOSY 2 mL  . traMADol (ULTRAM) 50 MG tablet    Sig: Take 1 tablet (50 mg total) by mouth 2 (two) times daily.    Dispense:  60 tablet    Refill:  0    For chronic pain syndrome   Medications administered: We administered Sodium Hyaluronate.  See the medical record for exact dosing, route, and time of administration.  Follow-up plan:   Return in about 4 weeks (around 10/11/2020) for Left knee Hyalgan No. 2.     Recent Visits Date Type Provider  Dept  08/18/20 Office Visit Gillis Santa, MD [facility]  Showing recent visits within past 90 days and meeting all other requirements Today's Visits Date Type Provider Dept  09/13/20 Procedure visit Gillis Santa, MD [facility]  Showing today's visits and meeting all other requirements Future Appointments No visits were found meeting these conditions. Showing future appointments within next 90 days and meeting all other requirements  Disposition: Discharge home  Discharge (Date  Time): 09/13/2020;  0944 hrs.   Primary Care Physician: Sallee Lange, NP Location: [facility] Note by: Gillis Santa, MD Date: 09/13/2020; Time: 10:09 AM  Disclaimer:  Medicine is not an exact science. The only guarantee in medicine is that nothing is guaranteed. It is important to note that the decision to proceed with this intervention was based on the information collected from the patient. The Data and conclusions were drawn from the patient's questionnaire, the interview, and the physical examination. Because the information was provided in large part by the patient, it cannot be guaranteed that it has not been purposely or unconsciously manipulated. Every effort has been made to obtain as much relevant data as possible for this evaluation. It is important to note that the conclusions that lead to this procedure are derived in large part from the available data. Always take into account that the treatment will also be dependent on availability of resources and existing treatment guidelines, considered by other Pain Management Practitioners as being common knowledge and practice, at the time of the intervention. For Medico-Legal purposes, it is also important to point out that variation in procedural techniques and pharmacological choices are the acceptable norm. The indications, contraindications, technique, and results of the above procedure should only be  interpreted and judged by a Board-Certified Interventional Pain Specialist with extensive familiarity and expertise in the same exact procedure and technique.

## 2020-09-14 ENCOUNTER — Telehealth: Payer: Self-pay

## 2020-09-14 NOTE — Telephone Encounter (Signed)
Post procedure phone call.  Patient said she is doing well.

## 2020-09-29 ENCOUNTER — Encounter: Payer: Self-pay | Admitting: Student in an Organized Health Care Education/Training Program

## 2020-09-29 ENCOUNTER — Ambulatory Visit
Payer: 59 | Attending: Student in an Organized Health Care Education/Training Program | Admitting: Student in an Organized Health Care Education/Training Program

## 2020-09-29 ENCOUNTER — Other Ambulatory Visit: Payer: Self-pay

## 2020-09-29 DIAGNOSIS — M1712 Unilateral primary osteoarthritis, left knee: Secondary | ICD-10-CM | POA: Diagnosis not present

## 2020-09-29 DIAGNOSIS — G8929 Other chronic pain: Secondary | ICD-10-CM

## 2020-09-29 DIAGNOSIS — G894 Chronic pain syndrome: Secondary | ICD-10-CM

## 2020-09-29 DIAGNOSIS — Z0289 Encounter for other administrative examinations: Secondary | ICD-10-CM

## 2020-09-29 DIAGNOSIS — M25562 Pain in left knee: Secondary | ICD-10-CM

## 2020-09-29 MED ORDER — SODIUM HYALURONATE (VISCOSUP) 20 MG/2ML IX SOSY: 2.0000 mL | PREFILLED_SYRINGE | Freq: Once | INTRA_ARTICULAR | Status: AC

## 2020-09-29 MED ORDER — TRAMADOL HCL 50 MG PO TABS: 50.0000 mg | ORAL_TABLET | Freq: Two times a day (BID) | ORAL | 1 refills | Status: DC

## 2020-09-29 MED ORDER — GABAPENTIN 300 MG PO CAPS: 300.0000 mg | ORAL_CAPSULE | Freq: Two times a day (BID) | ORAL | 5 refills | Status: DC

## 2020-09-29 MED ORDER — LIDOCAINE HCL (PF) 1 % IJ SOLN
5.0000 mL | Freq: Once | INTRAMUSCULAR | Status: AC
  Administered 2020-09-29: 5 mL

## 2020-09-29 MED ORDER — LIDOCAINE HCL 2 % IJ SOLN
INTRAMUSCULAR | Status: AC
  Filled 2020-09-29: qty 20

## 2020-09-29 NOTE — Progress Notes (Signed)
Nursing Pain Medication Assessment:  Safety precautions to be maintained throughout the outpatient stay will include: orient to surroundings, keep bed in low position, maintain call bell within reach at all times, provide assistance with transfer out of bed and ambulation.  Medication Inspection Compliance: Pill count conducted under aseptic conditions, in front of the patient. Neither the pills nor the bottle was removed from the patient's sight at any time. Once count was completed pills were immediately returned to the patient in their original bottle.  Medication: Tramadol (Ultram) Pill/Patch Count: 42 of 60 pills remain Pill/Patch Appearance: Markings consistent with prescribed medication Bottle Appearance: Standard pharmacy container. Clearly labeled. Filled Date: 15 / 27 / 2021 Last Medication intake:  YesterdaySafety precautions to be maintained throughout the outpatient stay will include: orient to surroundings, keep bed in low position, maintain call bell within reach at all times, provide assistance with transfer out of bed and ambulation.

## 2020-09-29 NOTE — Patient Instructions (Signed)

## 2020-09-29 NOTE — Progress Notes (Signed)
PROVIDER NOTE: Information contained herein reflects review and annotations entered in association with encounter. Interpretation of such information and data should be left to medically-trained personnel. Information provided to patient can be located elsewhere in the medical record under "Patient Instructions". Document created using STT-dictation technology, any transcriptional errors that may result from process are unintentional.    Patient: Amy Logan  Service Category: Procedure  Provider: Gillis Santa, MD  DOB: 1971-07-24  DOS: 09/29/2020  Location: [facility]  MRN: [id number]  Setting: Ambulatory - outpatient  Referring Provider: Gillis Santa, MD  Type: Established Patient  Specialty: Interventional Pain Management  PCP: Sallee Lange, NP   Primary Reason for Visit: Interventional Pain Management Treatment. CC: Knee Pain (left)  Procedure:          Anesthesia, Analgesia, Anxiolysis:  Type: Therapeutic Intra-Articular Hyalgan Knee Injection #2  Region: Medial infrapatellar Knee Region Level: Knee Joint Laterality: Left knee  Type: Local Anesthesia Indication(s): Analgesia         Local Anesthetic: Lidocaine 1-2% Route: Infiltration (/IM) IV Access: Declined Sedation: Declined   Position: Sitting   Indications: 1. Chronic pain of left knee   2. Primary osteoarthritis of left knee   3. Pain management contract signed   4. Chronic pain syndrome    Pain Score: Pre-procedure: 6 /10 Post-procedure: 6 /10   Pre-op Assessment:  Amy Logan is a 49 y.o. (year old), female patient, seen today for interventional treatment. She  has a past surgical history that includes Cholecystectomy; Appendectomy; Replacement total knee; Abdominal hysterectomy; Joint replacement; Breast cyst aspiration (Left); Colonoscopy (2018); cyst removed; and Lipoma excision (Right, 10/09/2018). Amy Logan has a current medication list which includes the following prescription(s):  cetirizine, cyanocobalamin, duloxetine, folic acid, gabapentin, humira pen, ibuprofen, lisinopril-hydrochlorothiazide, methotrexate, omeprazole, rosuvastatin, [START ON 10/15/2020] tramadol, and triamcinolone. Her primarily concern today is the Knee Pain (left)  Initial Vital Signs:  Pulse/HCG Rate: 71  Temp: (!) 97.4 F (36.3 C) Resp: 18 BP: (!) 142/89 SpO2: 100 %  BMI: Estimated body mass index is 35.67 kg/m as calculated from the following:   Height as of this encounter: 5\' 6"  (1.676 m).   Weight as of this encounter: 221 lb (100.2 kg).  Risk Assessment: Allergies: Reviewed. She is allergic to onion, coconut flavor, demerol [meperidine], morphine and related, phenergan [promethazine hcl], stadol [butorphanol], zofran [ondansetron hcl], and imitrex [sumatriptan].  Allergy Precautions: None required Coagulopathies: Reviewed. None identified.  Blood-thinner therapy: None at this time Active Infection(s): Reviewed. None identified. Amy Logan is afebrile  Site Confirmation: Amy Logan was asked to confirm the procedure and laterality before marking the site Procedure checklist: Completed Consent: Before the procedure and under the influence of no sedative(s), amnesic(s), or anxiolytics, the patient was informed of the treatment options, risks and possible complications. To fulfill our ethical and legal obligations, as recommended by the American Medical Association's Code of Ethics, I have informed the patient of my clinical impression; the nature and purpose of the treatment or procedure; the risks, benefits, and possible complications of the intervention; the alternatives, including doing nothing; the risk(s) and benefit(s) of the alternative treatment(s) or procedure(s); and the risk(s) and benefit(s) of doing nothing. The patient was provided information about the general risks and possible complications associated with the procedure. These may include, but are not limited to: failure to  achieve desired goals, infection, bleeding, organ or nerve damage, allergic reactions, paralysis, and death. In addition, the patient was informed of those risks and complications  associated to the procedure, such as failure to decrease pain; infection; bleeding; organ or nerve damage with subsequent damage to sensory, motor, and/or autonomic systems, resulting in permanent pain, numbness, and/or weakness of one or several areas of the body; allergic reactions; (i.e.: anaphylactic reaction); and/or death. Furthermore, the patient was informed of those risks and complications associated with the medications. These include, but are not limited to: allergic reactions (i.e.: anaphylactic or anaphylactoid reaction(s)); adrenal axis suppression; blood sugar elevation that in diabetics may result in ketoacidosis or comma; water retention that in patients with history of congestive heart failure may result in shortness of breath, pulmonary edema, and decompensation with resultant heart failure; weight gain; swelling or edema; medication-induced neural toxicity; particulate matter embolism and blood vessel occlusion with resultant organ, and/or nervous system infarction; and/or aseptic necrosis of one or more joints. Finally, the patient was informed that Medicine is not an exact science; therefore, there is also the possibility of unforeseen or unpredictable risks and/or possible complications that may result in a catastrophic outcome. The patient indicated having understood very clearly. We have given the patient no guarantees and we have made no promises. Enough time was given to the patient to ask questions, all of which were answered to the patient's satisfaction. [name] has indicated that she wanted to continue with the procedure. Attestation: I, the ordering provider, attest that I have discussed with the patient the benefits, risks, side-effects, alternatives, likelihood of achieving goals, and potential  problems during recovery for the procedure that I have provided informed consent. Date  Time: 09/29/2020  9:40 AM  Pre-Procedure Preparation:  Monitoring: As per clinic protocol. Respiration, ETCO2, SpO2, BP, heart rate and rhythm monitor placed and checked for adequate function Safety Precautions: Patient was assessed for positional comfort and pressure points before starting the procedure. Time-out: I initiated and conducted the "Time-out" before starting the procedure, as per protocol. The patient was asked to participate by confirming the accuracy of the "Time Out" information. Verification of the correct person, site, and procedure were performed and confirmed by me, the nursing staff, and the patient. "Time-out" conducted as per Joint Commission's Universal Protocol (UP.01.01.01). Time: 0959  Description of Procedure:          Target Area: Knee Joint Approach: Just above the Medial tibial plateau, lateral to the infrapatellar tendon. Area Prepped: Entire knee area, from the mid-thigh to the mid-shin. DuraPrep (Iodine Povacrylex [0.7% available iodine] and Isopropyl Alcohol, 74% w/w) Safety Precautions: Aspiration looking for blood return was conducted prior to all injections. At no point did we inject any substances, as a needle was being advanced. No attempts were made at seeking any paresthesias. Safe injection practices and needle disposal techniques used. Medications properly checked for expiration dates. SDV (single dose vial) medications used. Description of the Procedure: Protocol guidelines were followed. The patient was placed in position over the fluoroscopy table. The target area was identified and the area prepped in the usual manner. Skin & deeper tissues infiltrated with local anesthetic. Appropriate amount of time allowed to pass for local anesthetics to take effect. The procedure needles were then advanced to the target area. Proper needle placement secured. Negative aspiration  confirmed. Solution injected in intermittent fashion, asking for systemic symptoms every 0.5cc of injectate. The needles were then removed and the area cleansed, making sure to leave some of the prepping solution back to take advantage of its long term bactericidal properties. Vitals:   09/29/20 0942  BP: (!) 142/89  Pulse:  71  Resp: 18  Temp: (!) 97.4 F (36.3 C)  TempSrc: Temporal  SpO2: 100%  Weight: 221 lb (100.2 kg)  Height: 5\' 6"  (1.676 m)    Start Time: 1000 hrs. End Time: 1001 hrs. Materials:  Needle(s) Type: Regular needle Gauge: 25G Length: 1.5-in Medication(s): Please see orders for medications and dosing details.  Post-operative Assessment:  Post-procedure Vital Signs:  Pulse/HCG Rate: 71  Temp: (!) 97.4 F (36.3 C) Resp: 18 BP: (!) 142/89 SpO2: 100 %  EBL: None  Complications: No immediate post-treatment complications observed by team, or reported by patient.  Note: The patient tolerated the entire procedure well. A repeat set of vitals were taken after the procedure and the patient was kept under observation following institutional policy, for this type of procedure. Post-procedural neurological assessment was performed, showing return to baseline, prior to discharge. The patient was provided with post-procedure discharge instructions, including a section on how to identify potential problems. Should any problems arise concerning this procedure, the patient was given instructions to immediately contact us, at any time, without hesitation. In any case, we plan to contact the patient by telephone for a follow-up status report regarding this interventional procedure.  Comments:  No additional relevant information.    Pharmacotherapy Assessment   Analgesic: Tramadol 50 mg twice daily as needed, quantity 60/month    Monitoring: [place] PMP: PDMP reviewed during this encounter.         UDS:  Summary  Date Value Ref Range Status  08/19/2020 Note  Final    Comment:     ==================================================================== Compliance Drug Analysis, Ur ==================================================================== Test                             Result       Flag       Units  Drug Present and Declared for Prescription Verification   Tramadol                       293          EXPECTED   ng/mg creat   O-Desmethyltramadol            1042         EXPECTED   ng/mg creat   N-Desmethyltramadol            107          EXPECTED   ng/mg creat    Source of tramadol is a prescription medication. O-desmethyltramadol    and N-desmethyltramadol are expected metabolites of tramadol.  Drug Present not Declared for Prescription Verification   Naproxen                       PRESENT      UNEXPECTED   Diphenhydramine                PRESENT      UNEXPECTED  Drug Absent but Declared for Prescription Verification   Gabapentin                     Not Detected UNEXPECTED   Duloxetine                     Not Detected UNEXPECTED   Ibuprofen                      Not Detected UNEXPECTED  Ibuprofen, as indicated in the declared medication list, is not    always detected even when used as directed.  ==================================================================== Test                      Result    Flag   Units      Ref Range   Creatinine              113              mg/dL      >=20 ==================================================================== Declared Medications:  The flagging and interpretation on this report are based on the  following declared medications.  Unexpected results may arise from  inaccuracies in the declared medications.   **Note: The testing scope of this panel includes these medications:   Duloxetine (Cymbalta)  Gabapentin  Tramadol (Ultram)   **Note: The testing scope of this panel does not include small to  moderate amounts of these reported medications:   Ibuprofen (Advil)   **Note: The testing scope of this  panel does not include the  following reported medications:   Adalimumab (Humira)  Cetirizine (Zyrtec)  Folic Acid  Hydrochlorothiazide  Lisinopril  Methotrexate  Omeprazole  Rosuvastatin  Triamcinolone (Kenalog)  Vitamin B12 ==================================================================== For clinical consultation, please call [phone]. ====================================================================       Plan of Care  Orders:  Orders Placed This Encounter  Procedures  . KNEE INJECTION    Hyalgan knee injection. Please order Hyalgan.    Standing Status:   Future    Standing Expiration Date:   10/29/2020    Scheduling Instructions:     Procedure: LEFT           Side: Bilateral     Sedation: None     Timeframe: in 3-4 weeks    Order Specific Question:   Where will this procedure be performed?    Answer:   [facility]    Medications ordered for procedure: Meds ordered this encounter  Medications  . traMADol (ULTRAM) 50 MG tablet    Sig: Take 1 tablet (50 mg total) by mouth 2 (two) times daily.    Dispense:  60 tablet    Refill:  1    For chronic pain syndrome  . gabapentin (NEURONTIN) 300 MG capsule    Sig: Take 1 capsule (300 mg total) by mouth 2 (two) times daily.    Dispense:  60 capsule    Refill:  5  . lidocaine (PF) (XYLOCAINE) 1 % injection 5 mL  . Sodium Hyaluronate SOSY 2 mL   Medications administered: We administered lidocaine (PF) and Sodium Hyaluronate.  See the medical record for exact dosing, route, and time of administration.  Follow-up plan:   Return in about 4 weeks (around 10/27/2020) for Left Hyalgan #3.     Recent Visits Date Type Provider Dept  09/13/20 Procedure visit Gillis Santa, MD [facility]  08/18/20 Office Visit Gillis Santa, MD [facility]  Showing recent visits within past 90 days and meeting all other requirements Today's Visits Date Type Provider Dept  09/29/20 Procedure visit  Gillis Santa, MD [facility]  Showing today's visits and meeting all other requirements Future Appointments Date Type Provider Dept  12/09/20 Appointment Gillis Santa, MD [facility]  Showing future appointments within next 90 days and meeting all other requirements  Disposition: Discharge home  Discharge (Date  Time): 09/29/2020; 1010 hrs.   Primary Care Physician: Sallee Lange,  NP Location: [facility] Note by: Gillis Santa, MD Date: 09/29/2020; Time: 10:16 AM  Disclaimer:  Medicine is not an exact science. The only guarantee in medicine is that nothing is guaranteed. It is important to note that the decision to proceed with this intervention was based on the information collected from the patient. The Data and conclusions were drawn from the patient's questionnaire, the interview, and the physical examination. Because the information was provided in large part by the patient, it cannot be guaranteed that it has not been purposely or unconsciously manipulated. Every effort has been made to obtain as much relevant data as possible for this evaluation. It is important to note that the conclusions that lead to this procedure are derived in large part from the available data. Always take into account that the treatment will also be dependent on availability of resources and existing treatment guidelines, considered by other Pain Management Practitioners as being common knowledge and practice, at the time of the intervention. For Medico-Legal purposes, it is also important to point out that variation in procedural techniques and pharmacological choices are the acceptable norm. The indications, contraindications, technique, and results of the above procedure should only be interpreted and judged by a Board-Certified Interventional Pain Specialist with extensive familiarity and expertise in the same exact procedure and technique.

## 2020-09-30 ENCOUNTER — Telehealth: Payer: Self-pay

## 2020-09-30 NOTE — Telephone Encounter (Signed)
Post procedure follow up phone call.  Patient states she is doing fine 

## 2020-10-06 ENCOUNTER — Ambulatory Visit: Payer: 59 | Admitting: Student in an Organized Health Care Education/Training Program

## 2020-11-08 ENCOUNTER — Ambulatory Visit
Payer: 59 | Attending: Student in an Organized Health Care Education/Training Program | Admitting: Student in an Organized Health Care Education/Training Program

## 2020-11-08 ENCOUNTER — Encounter: Payer: Self-pay | Admitting: Student in an Organized Health Care Education/Training Program

## 2020-11-08 ENCOUNTER — Other Ambulatory Visit: Payer: Self-pay

## 2020-11-08 DIAGNOSIS — M1712 Unilateral primary osteoarthritis, left knee: Secondary | ICD-10-CM | POA: Diagnosis not present

## 2020-11-08 DIAGNOSIS — G8929 Other chronic pain: Secondary | ICD-10-CM | POA: Diagnosis present

## 2020-11-08 DIAGNOSIS — M25562 Pain in left knee: Secondary | ICD-10-CM | POA: Insufficient documentation

## 2020-11-08 DIAGNOSIS — Z79899 Other long term (current) drug therapy: Secondary | ICD-10-CM | POA: Insufficient documentation

## 2020-11-08 MED ORDER — LIDOCAINE HCL (PF) 2 % IJ SOLN
INTRAMUSCULAR | Status: AC
  Filled 2020-11-08: qty 10

## 2020-11-08 MED ORDER — LIDOCAINE HCL 2 % IJ SOLN
20.0000 mL | Freq: Once | INTRAMUSCULAR | Status: AC
  Administered 2020-11-08: 10:00:00 400 mg

## 2020-11-08 MED ORDER — SODIUM HYALURONATE (VISCOSUP) 20 MG/2ML IX SOSY
2.0000 mL | PREFILLED_SYRINGE | Freq: Once | INTRA_ARTICULAR | Status: AC
  Administered 2020-11-08: 2 mL via INTRA_ARTICULAR

## 2020-11-08 NOTE — Progress Notes (Signed)
Safety precautions to be maintained throughout the outpatient stay will include: orient to surroundings, keep bed in low position, maintain call bell within reach at all times, provide assistance with transfer out of bed and ambulation.  

## 2020-11-08 NOTE — Patient Instructions (Signed)

## 2020-11-08 NOTE — Progress Notes (Signed)
PROVIDER NOTE: Information contained herein reflects review and annotations entered in association with encounter. Interpretation of such information and data should be left to medically-trained personnel. Information provided to patient can be located elsewhere in the medical record under "Patient Instructions". Document created using STT-dictation technology, any transcriptional errors that may result from process are unintentional.    Patient: Amy Logan  Service Category: Procedure  Provider: Gillis Santa, MD  DOB: [birth year]  DOS: 11/08/2020  Location: [facility]  MRN: [id number]  Setting: Ambulatory - outpatient  Referring Provider: Gillis Santa, MD  Type: Established Patient  Specialty: Interventional Pain Management  PCP: Sallee Lange, NP   Primary Reason for Visit: Interventional Pain Management Treatment. CC: Knee Pain (Left )  Procedure:          Anesthesia, Analgesia, Anxiolysis:  Type: Therapeutic Intra-Articular Hyalgan Knee Injection #3  Region: Medial infrapatellar Knee Region Level: Knee Joint Laterality: Left knee  Type: Local Anesthesia Indication(s): Analgesia         Local Anesthetic: Lidocaine 1-2% Route: Infiltration ([place]/IM) IV Access: Declined Sedation: Declined   Position: Sitting   Indications: 1. Chronic pain of left knee   2. Primary osteoarthritis of left knee    Pain Score: Pre-procedure: 6 /10 Post-procedure: 6 /10   Pre-op Assessment:  [name] is a 49 y.o. (year old), female patient, seen today for interventional treatment. She  has a past surgical history that includes Cholecystectomy; Appendectomy; Replacement total knee; Abdominal hysterectomy; Joint replacement; Breast cyst aspiration (Left); Colonoscopy (2018); cyst removed; and Lipoma excision (Right, 10/09/2018). [name] has a current medication list which includes the following prescription(s): cetirizine, cyanocobalamin, duloxetine, folic acid, furosemide,  gabapentin, humira pen, ibuprofen, lisinopril-hydrochlorothiazide, methotrexate, omeprazole, rosuvastatin, tramadol, and triamcinolone. Her primarily concern today is the Knee Pain (Left )  Initial Vital Signs:  Pulse/HCG Rate: 70  Temp: (!) 97.4 F (36.3 C) Resp: 16 BP: (!) 145/83 SpO2: 100 %  BMI: Estimated body mass index is 35.67 kg/m as calculated from the following:   Height as of this encounter: 5\' 6"  (1.676 m).   Weight as of this encounter: 221 lb (100.2 kg).  Risk Assessment: Allergies: Reviewed. She is allergic to onion, coconut flavor, demerol [meperidine], morphine and related, phenergan [promethazine hcl], stadol [butorphanol], zofran [ondansetron hcl], and imitrex [sumatriptan].  Allergy Precautions: None required Coagulopathies: Reviewed. None identified.  Blood-thinner therapy: None at this time Active Infection(s): Reviewed. None identified. [name] is afebrile  Site Confirmation: [name] was asked to confirm the procedure and laterality before marking the site Procedure checklist: Completed Consent: Before the procedure and under the influence of no sedative(s), amnesic(s), or anxiolytics, the patient was informed of the treatment options, risks and possible complications. To fulfill our ethical and legal obligations, as recommended by the American Medical Association's Code of Ethics, I have informed the patient of my clinical impression; the nature and purpose of the treatment or procedure; the risks, benefits, and possible complications of the intervention; the alternatives, including doing nothing; the risk(s) and benefit(s) of the alternative treatment(s) or procedure(s); and the risk(s) and benefit(s) of doing nothing. The patient was provided information about the general risks and possible complications associated with the procedure. These may include, but are not limited to: failure to achieve desired goals, infection, bleeding, organ or nerve damage, allergic  reactions, paralysis, and death. In addition, the patient was informed of those risks and complications associated to the procedure, such as failure to decrease pain; infection; bleeding; organ  or nerve damage with subsequent damage to sensory, motor, and/or autonomic systems, resulting in permanent pain, numbness, and/or weakness of one or several areas of the body; allergic reactions; (i.e.: anaphylactic reaction); and/or death. Furthermore, the patient was informed of those risks and complications associated with the medications. These include, but are not limited to: allergic reactions (i.e.: anaphylactic or anaphylactoid reaction(s)); adrenal axis suppression; blood sugar elevation that in diabetics may result in ketoacidosis or comma; water retention that in patients with history of congestive heart failure may result in shortness of breath, pulmonary edema, and decompensation with resultant heart failure; weight gain; swelling or edema; medication-induced neural toxicity; particulate matter embolism and blood vessel occlusion with resultant organ, and/or nervous system infarction; and/or aseptic necrosis of one or more joints. Finally, the patient was informed that Medicine is not an exact science; therefore, there is also the possibility of unforeseen or unpredictable risks and/or possible complications that may result in a catastrophic outcome. The patient indicated having understood very clearly. We have given the patient no guarantees and we have made no promises. Enough time was given to the patient to ask questions, all of which were answered to the patient's satisfaction. [name] has indicated that she wanted to continue with the procedure. Attestation: I, the ordering provider, attest that I have discussed with the patient the benefits, risks, side-effects, alternatives, likelihood of achieving goals, and potential problems during recovery for the procedure that I have provided informed  consent. Date  Time: 11/08/2020  9:19 AM  Pre-Procedure Preparation:  Monitoring: As per clinic protocol. Respiration, ETCO2, SpO2, BP, heart rate and rhythm monitor placed and checked for adequate function Safety Precautions: Patient was assessed for positional comfort and pressure points before starting the procedure. Time-out: I initiated and conducted the "Time-out" before starting the procedure, as per protocol. The patient was asked to participate by confirming the accuracy of the "Time Out" information. Verification of the correct person, site, and procedure were performed and confirmed by me, the nursing staff, and the patient. "Time-out" conducted as per Joint Commission's Universal Protocol (UP.01.01.01). Time: 0953  Description of Procedure:          Target Area: Knee Joint Approach: Just above the Medial tibial plateau, lateral to the infrapatellar tendon. Area Prepped: Entire knee area, from the mid-thigh to the mid-shin. DuraPrep (Iodine Povacrylex [0.7% available iodine] and Isopropyl Alcohol, 74% w/w) Safety Precautions: Aspiration looking for blood return was conducted prior to all injections. At no point did we inject any substances, as a needle was being advanced. No attempts were made at seeking any paresthesias. Safe injection practices and needle disposal techniques used. Medications properly checked for expiration dates. SDV (single dose vial) medications used. Description of the Procedure: Protocol guidelines were followed. The patient was placed in position over the fluoroscopy table. The target area was identified and the area prepped in the usual manner. Skin & deeper tissues infiltrated with local anesthetic. Appropriate amount of time allowed to pass for local anesthetics to take effect. The procedure needles were then advanced to the target area. Proper needle placement secured. Negative aspiration confirmed. Solution injected in intermittent fashion, asking for systemic  symptoms every 0.5cc of injectate. The needles were then removed and the area cleansed, making sure to leave some of the prepping solution back to take advantage of its long term bactericidal properties. Vitals:   11/08/20 0936  BP: (!) 145/83  Pulse: 70  Resp: 16  Temp: (!) 97.4 F (36.3 C)  TempSrc:  Temporal  SpO2: 100%  Weight: 221 lb (100.2 kg)  Height: 5\' 6"  (1.676 m)    Start Time: 0953 hrs. End Time: 0957 hrs. Materials:  Needle(s) Type: Regular needle Gauge: 25G Length: 1.5-in Medication(s): Please see orders for medications and dosing details.  Post-operative Assessment:  Post-procedure Vital Signs:  Pulse/HCG Rate: 70  Temp: (!) 97.4 F (36.3 C) Resp: 16 BP: (!) 145/83 SpO2: 100 %  EBL: None  Complications: No immediate post-treatment complications observed by team, or reported by patient.  Note: The patient tolerated the entire procedure well. A repeat set of vitals were taken after the procedure and the patient was kept under observation following institutional policy, for this type of procedure. Post-procedural neurological assessment was performed, showing return to baseline, prior to discharge. The patient was provided with post-procedure discharge instructions, including a section on how to identify potential problems. Should any problems arise concerning this procedure, the patient was given instructions to immediately contact us, at any time, without hesitation. In any case, we plan to contact the patient by telephone for a follow-up status report regarding this interventional procedure.  Comments:  No additional relevant information.      Plan of Care  Orders:  No orders of the defined types were placed in this encounter.   Medications ordered for procedure: Meds ordered this encounter  Medications  . Sodium Hyaluronate SOSY 2 mL  . lidocaine (XYLOCAINE) 2 % (with pres) injection 400 mg   Medications administered: We administered Sodium  Hyaluronate and lidocaine.  See the medical record for exact dosing, route, and time of administration.  Follow-up plan:   Return for Keep sch. appt.     Recent Visits Date Type Provider Dept  09/29/20 Procedure visit Gillis Santa, MD [facility]  09/13/20 Procedure visit Gillis Santa, MD [facility]  08/18/20 Office Visit Gillis Santa, MD [facility]  Showing recent visits within past 90 days and meeting all other requirements Today's Visits Date Type Provider Dept  11/08/20 Procedure visit Gillis Santa, MD [facility]  Showing today's visits and meeting all other requirements Future Appointments Date Type Provider Dept  12/09/20 Appointment Gillis Santa, MD [facility]  Showing future appointments within next 90 days and meeting all other requirements  Disposition: Discharge home  Discharge (Date  Time): 11/08/2020; 1010 hrs.   Primary Care Physician: Sallee Lange, NP Location: [facility] Note by: Gillis Santa, MD Date: 11/08/2020; Time: 10:20 AM  Disclaimer:  Medicine is not an exact science. The only guarantee in medicine is that nothing is guaranteed. It is important to note that the decision to proceed with this intervention was based on the information collected from the patient. The Data and conclusions were drawn from the patient's questionnaire, the interview, and the physical examination. Because the information was provided in large part by the patient, it cannot be guaranteed that it has not been purposely or unconsciously manipulated. Every effort has been made to obtain as much relevant data as possible for this evaluation. It is important to note that the conclusions that lead to this procedure are derived in large part from the available data. Always take into account that the treatment will also be dependent on availability of resources and existing treatment guidelines,  considered by other Pain Management Practitioners as being common knowledge and practice, at the time of the intervention. For Medico-Legal purposes, it is also important to point out that variation in procedural techniques  and pharmacological choices are the acceptable norm. The indications, contraindications, technique, and results of the above procedure should only be interpreted and judged by a Board-Certified Interventional Pain Specialist with extensive familiarity and expertise in the same exact procedure and technique.

## 2020-11-09 ENCOUNTER — Telehealth: Payer: Self-pay

## 2020-11-09 NOTE — Telephone Encounter (Signed)
Post procedure phone call. Patient states she is doing good.  

## 2020-12-09 ENCOUNTER — Other Ambulatory Visit: Payer: Self-pay

## 2020-12-09 ENCOUNTER — Encounter: Payer: Self-pay | Admitting: Student in an Organized Health Care Education/Training Program

## 2020-12-09 ENCOUNTER — Ambulatory Visit
Payer: 59 | Attending: Student in an Organized Health Care Education/Training Program | Admitting: Student in an Organized Health Care Education/Training Program

## 2020-12-09 VITALS — BP 150/103 | HR 77 | Temp 97.1°F | Ht 66.0 in | Wt 214.0 lb

## 2020-12-09 DIAGNOSIS — G894 Chronic pain syndrome: Secondary | ICD-10-CM | POA: Insufficient documentation

## 2020-12-09 DIAGNOSIS — Z0289 Encounter for other administrative examinations: Secondary | ICD-10-CM | POA: Diagnosis not present

## 2020-12-09 DIAGNOSIS — M1712 Unilateral primary osteoarthritis, left knee: Secondary | ICD-10-CM | POA: Insufficient documentation

## 2020-12-09 DIAGNOSIS — M797 Fibromyalgia: Secondary | ICD-10-CM | POA: Diagnosis not present

## 2020-12-09 DIAGNOSIS — Z96651 Presence of right artificial knee joint: Secondary | ICD-10-CM | POA: Diagnosis present

## 2020-12-09 DIAGNOSIS — G8929 Other chronic pain: Secondary | ICD-10-CM | POA: Insufficient documentation

## 2020-12-09 DIAGNOSIS — M25562 Pain in left knee: Secondary | ICD-10-CM | POA: Insufficient documentation

## 2020-12-09 DIAGNOSIS — L4050 Arthropathic psoriasis, unspecified: Secondary | ICD-10-CM | POA: Insufficient documentation

## 2020-12-09 MED ORDER — TRAMADOL HCL 50 MG PO TABS
50.0000 mg | ORAL_TABLET | Freq: Two times a day (BID) | ORAL | 2 refills | Status: DC
Start: 2020-12-23 — End: 2021-03-08

## 2020-12-09 NOTE — Progress Notes (Signed)
Nursing Pain Medication Assessment:  Safety precautions to be maintained throughout the outpatient stay will include: orient to surroundings, keep bed in low position, maintain call bell within reach at all times, provide assistance with transfer out of bed and ambulation.  Medication Inspection Compliance: Pill count conducted under aseptic conditions, in front of the patient. Neither the pills nor the bottle was removed from the patient's sight at any time. Once count was completed pills were immediately returned to the patient in their original bottle.  Medication: Tramadol (Ultram) Pill/Patch Count: 29 of 60 pills remain Pill/Patch Appearance: Markings consistent with prescribed medication Bottle Appearance: Standard pharmacy container. Clearly labeled. Filled Date: 1 / 4 / 22 Last Medication intake:  TodaySafety precautions to be maintained throughout the outpatient stay will include: orient to surroundings, keep bed in low position, maintain call bell within reach at all times, provide assistance with transfer out of bed and ambulation.

## 2020-12-09 NOTE — Progress Notes (Signed)
PROVIDER NOTE: Information contained herein reflects review and annotations entered in association with encounter. Interpretation of such information and data should be left to medically-trained personnel. Information provided to patient can be located elsewhere in the medical record under "Patient Instructions". Document created using STT-dictation technology, any transcriptional errors that may result from process are unintentional.    Patient: Amy Logan  Service Category: E/M  Provider: Gillis Santa, MD  DOB: Apr 10, 1971  DOS: 12/09/2020  Specialty: Interventional Pain Management  MRN: [id number]  Setting: Ambulatory outpatient  PCP: Sallee Lange, NP  Type: Established Patient    Referring Provider: Sallee Lange, *  Location: Office  Delivery: Face-to-face     HPI  [name], a 50 y.o. year old female, is here today because of her Chronic pain of left knee [M25.562, G89.29]. [name] primary complain today is Joint Pain Last encounter: My last encounter with her was on 11/08/2020. Pertinent problems: [name] has Incomplete tear of right rotator cuff; Neuropathy; Obesity (BMI 30-39.9); Fibromyalgia; Chronic pain of left knee; Status post total right knee replacement; and Psoriatic arthritis ([place]) on their pertinent problem list. Pain Assessment: Severity of Chronic pain is reported as a 8 /10. Location: Generalized Right,Left,Lower,Upper/pain radiaties everywhere. Onset: More than a month ago. Quality: Aching,Burning,Tingling. Timing: Constant. Modifying factor(s): meds, heat, ice. Vitals:  height is $RemoveB'5\' 6"'fAIXjLjP$  (1.676 m) and weight is 214 lb (97.1 kg). Her temperature is 97.1 F (36.2 C) (abnormal). Her blood pressure is 150/103 (abnormal) and her pulse is 77. Her oxygen saturation is 100%.   Reason for encounter: medication management.   No change in medical history since last visit.  Patient's pain is at baseline.  Patient continues multimodal pain regimen as prescribed.   States that it provides pain relief and improvement in functional status. Has noticed significant pain relief in regards to her left knee pain after a series of 3 intra-articular Hyalgan injections.  Is able to bear more weight is able to get through her workday more comfortably.  Can repeat additional 2 later on in the year if she has worsening pain of her left knee.  Otherwise refill tramadol as below.  Pharmacotherapy Assessment   Analgesic: Tramadol 50 mg twice daily as needed, quantity 60/month    Monitoring: [place] PMP: PDMP reviewed during this encounter.       Pharmacotherapy: No side-effects or adverse reactions reported. Compliance: No problems identified. Effectiveness: Clinically acceptable.  Chauncey Fischer, RN  12/09/2020 12:51 PM  Sign when Signing Visit Nursing Pain Medication Assessment:  Safety precautions to be maintained throughout the outpatient stay will include: orient to surroundings, keep bed in low position, maintain call bell within reach at all times, provide assistance with transfer out of bed and ambulation.  Medication Inspection Compliance: Pill count conducted under aseptic conditions, in front of the patient. Neither the pills nor the bottle was removed from the patient's sight at any time. Once count was completed pills were immediately returned to the patient in their original bottle.  Medication: Tramadol (Ultram) Pill/Patch Count: 29 of 60 pills remain Pill/Patch Appearance: Markings consistent with prescribed medication Bottle Appearance: Standard pharmacy container. Clearly labeled. Filled Date: 1 / 4 / 22 Last Medication intake:  TodaySafety precautions to be maintained throughout the outpatient stay will include: orient to surroundings, keep bed in low position, maintain call bell within reach at all times, provide assistance with transfer out of bed and ambulation.     UDS:  Summary  Date Value  Ref Range Status  08/19/2020 Note  Final    Comment:     ==================================================================== Compliance Drug Analysis, Ur ==================================================================== Test                             Result       Flag       Units  Drug Present and Declared for Prescription Verification   Tramadol                       293          EXPECTED   ng/mg creat   O-Desmethyltramadol            1042         EXPECTED   ng/mg creat   N-Desmethyltramadol            107          EXPECTED   ng/mg creat    Source of tramadol is a prescription medication. O-desmethyltramadol    and N-desmethyltramadol are expected metabolites of tramadol.  Drug Present not Declared for Prescription Verification   Naproxen                       PRESENT      UNEXPECTED   Diphenhydramine                PRESENT      UNEXPECTED  Drug Absent but Declared for Prescription Verification   Gabapentin                     Not Detected UNEXPECTED   Duloxetine                     Not Detected UNEXPECTED   Ibuprofen                      Not Detected UNEXPECTED    Ibuprofen, as indicated in the declared medication list, is not    always detected even when used as directed.  ==================================================================== Test                      Result    Flag   Units      Ref Range   Creatinine              113              mg/dL      >=20 ==================================================================== Declared Medications:  The flagging and interpretation on this report are based on the  following declared medications.  Unexpected results may arise from  inaccuracies in the declared medications.   **Note: The testing scope of this panel includes these medications:   Duloxetine (Cymbalta)  Gabapentin  Tramadol (Ultram)   **Note: The testing scope of this panel does not include small to  moderate amounts of these reported medications:   Ibuprofen (Advil)   **Note: The testing scope of this panel  does not include the  following reported medications:   Adalimumab (Humira)  Cetirizine (Zyrtec)  Folic Acid  Hydrochlorothiazide  Lisinopril  Methotrexate  Omeprazole  Rosuvastatin  Triamcinolone (Kenalog)  Vitamin B12 ==================================================================== For clinical consultation, please call [phone]. ====================================================================      ROS  Constitutional: Denies any fever or chills Gastrointestinal: No reported hemesis, hematochezia, vomiting, or acute GI distress Musculoskeletal: Denies any acute  onset joint swelling, redness, loss of ROM, or weakness Neurological: No reported episodes of acute onset apraxia, aphasia, dysarthria, agnosia, amnesia, paralysis, loss of coordination, or loss of consciousness  Medication Review  Adalimumab, DULoxetine, cetirizine, cyanocobalamin, folic acid, furosemide, gabapentin, ibuprofen, lisinopril-hydrochlorothiazide, methotrexate, omeprazole, rosuvastatin, traMADol, and triamcinolone  History Review  Allergy: [name] is allergic to onion, coconut flavor, demerol [meperidine], morphine and related, phenergan [promethazine hcl], stadol [butorphanol], zofran [ondansetron hcl], and imitrex [sumatriptan]. Drug: [name]  reports no history of drug use. Alcohol:  reports current alcohol use. Tobacco:  reports that she has been smoking cigarettes. She has a 12.50 pack-year smoking history. She has never used smokeless tobacco. Social: [name]  reports that she has been smoking cigarettes. She has a 12.50 pack-year smoking history. She has never used smokeless tobacco. She reports current alcohol use. She reports that she does not use drugs. Medical:  has a past medical history of Anemia, Anxiety, Arthritis, Bradycardia, Cancer ([place]) (1990), Fibromyalgia, History of hiatal hernia, History of kidney stones, Hypertension, and Neuropathy. Surgical: [name]  has a past  surgical history that includes Cholecystectomy; Appendectomy; Replacement total knee; Abdominal hysterectomy; Joint replacement; Breast cyst aspiration (Left); Colonoscopy (2018); cyst removed; and Lipoma excision (Right, 10/09/2018). Family: family history includes Asthma in her mother; Breast cancer (age of onset: 28) in her cousin; Cancer in her father; Diabetes in her mother; Heart failure in her mother.  Laboratory Chemistry Profile   Renal Lab Results  Component Value Date   BUN 8 12/31/2018   CREATININE 0.71 12/31/2018   GFRAA >60 12/31/2018   GFRNONAA >60 12/31/2018     Hepatic Lab Results  Component Value Date   AST 16 12/30/2018   ALT 14 12/30/2018   ALBUMIN 4.4 12/30/2018   ALKPHOS 46 12/30/2018   LIPASE 24 12/30/2018     Electrolytes Lab Results  Component Value Date   NA 138 12/31/2018   K 3.5 12/31/2018   CL 104 12/31/2018   CALCIUM 8.6 (L) 12/31/2018   MG 2.1 03/06/2013     Bone No results found for: VD25OH, VD125OH2TOT, [id number], YP9509TO6, 25OHVITD1, 25OHVITD2, 25OHVITD3, TESTOFREE, TESTOSTERONE   Inflammation (CRP: Acute Phase) (ESR: Chronic Phase) Lab Results  Component Value Date   LATICACIDVEN 1.5 12/30/2018       Note: Above Lab results reviewed.  Recent Imaging Review  MR Abdomen W or Wo Contrast CLINICAL DATA:  Right-sided abdominal pain.  "Abdominal infection".  EXAM: MRI ABDOMEN WITHOUT AND WITH CONTRAST  TECHNIQUE: Multiplanar multisequence MR imaging of the abdomen was performed both before and after the administration of intravenous contrast.  CONTRAST:  7.5 cc Gadavist  COMPARISON:  Today's pelvic ultrasound and abdominopelvic CTs, dictated separately.  FINDINGS: Portions of exam are minimally motion degraded.  Lower chest: Small hiatal hernia. Normal heart size without pericardial or pleural effusion.  Hepatobiliary: Hepatomegaly, 22.4 cm craniocaudal. No focal liver lesion.  Cholecystectomy. Common duct upper normal  for prior cholecystectomy. Example at 10 mm in the porta hepatis. No obstructive stone or mass.  Pancreas:  Normal, without mass or ductal dilatation.  Spleen:  Normal in size, without focal abnormality.  Adrenals/Urinary Tract: Normal adrenal glands. Normal kidneys, without hydronephrosis.  Stomach/Bowel: Normal distal stomach and abdominal bowel loops.  Vascular/Lymphatic: Normal caliber of the aorta and branch vessels. No abdominal adenopathy.  Other: Small volume right-sided abdominal fluid, including image 36/4.  Musculoskeletal: No acute osseous abnormality.  IMPRESSION: 1. Small volume abdominal fluid, as detailed on CT. 2. Otherwise, no  acute process in the abdomen. 3. Hepatomegaly. 4. Cholecystectomy with borderline common duct dilatation, most likely within normal variation. No cause identified. 5. Small hiatal hernia.  Electronically Signed   By: Abigail Miyamoto M.D.   On: 12/30/2018 20:06 US PELVIS TRANSVANGINAL NON-OB (TV ONLY) CLINICAL DATA:  Right-sided pelvic pain post hysterectomy. Possible torsion.  EXAM: TRANSABDOMINAL AND TRANSVAGINAL ULTRASOUND OF PELVIS  TECHNIQUE: Both transabdominal and transvaginal ultrasound examinations of the pelvis were performed. Transabdominal technique was performed for global imaging of the pelvis including uterus, ovaries, adnexal regions, and pelvic cul-de-sac. It was necessary to proceed with endovaginal exam following the transabdominal exam to visualize the ovaries.  COMPARISON:  Abdominopelvic CT same date and 10/14/2018  FINDINGS: Uterus  Measurements: Surgically absent.  Right ovary  Measurements: Not visualized.  No adnexal mass seen.  Left ovary  Measurements: Not visualized.  No adnexal mass seen.  Other findings  A moderate amount of free pelvic fluid is present.  IMPRESSION: 1. Neither ovary is visualized post hysterectomy. 2. Etiology for the right adnexal enlargement seen on earlier CT  is not further clarified. 3. Nonspecific moderate free pelvic fluid.  Electronically Signed   By: Richardean Sale M.D.   On: 12/30/2018 17:04 US Pelvis Complete CLINICAL DATA:  Right-sided pelvic pain post hysterectomy. Possible torsion.  EXAM: TRANSABDOMINAL AND TRANSVAGINAL ULTRASOUND OF PELVIS  TECHNIQUE: Both transabdominal and transvaginal ultrasound examinations of the pelvis were performed. Transabdominal technique was performed for global imaging of the pelvis including uterus, ovaries, adnexal regions, and pelvic cul-de-sac. It was necessary to proceed with endovaginal exam following the transabdominal exam to visualize the ovaries.  COMPARISON:  Abdominopelvic CT same date and 10/14/2018  FINDINGS: Uterus  Measurements: Surgically absent.  Right ovary  Measurements: Not visualized.  No adnexal mass seen.  Left ovary  Measurements: Not visualized.  No adnexal mass seen.  Other findings  A moderate amount of free pelvic fluid is present.  IMPRESSION: 1. Neither ovary is visualized post hysterectomy. 2. Etiology for the right adnexal enlargement seen on earlier CT is not further clarified. 3. Nonspecific moderate free pelvic fluid.  Electronically Signed   By: Richardean Sale M.D.   On: 12/30/2018 17:04 CT ABDOMEN PELVIS W CONTRAST CLINICAL DATA:  Abdominal pain.  Leukocytosis.  EXAM: CT ABDOMEN AND PELVIS WITH CONTRAST  TECHNIQUE: Multidetector CT imaging of the abdomen and pelvis was performed using the standard protocol following bolus administration of intravenous contrast.  CONTRAST:  141mL OMNIPAQUE IOHEXOL 300 MG/ML  SOLN  COMPARISON:  CT 10/14/2018  FINDINGS: Lower chest: Lung bases are clear.  Hepatobiliary: Postcholecystectomy. Mild prominence of intrahepatic bile ducts similar comparison to comparison exam. The common bile duct measures 9 mm (image 31/2 which compares to 9 mm on comparison exam.  Pancreas: No pancreatic duct  dilatation. No pancreatic inflammation.  Spleen: Normal spleen  Adrenals/urinary tract: Adrenal glands and kidneys are normal. The ureters and bladder normal.  Stomach/Bowel: Stomach, small-bowel cecum normal. The cecum has been migrated into the LEFT upper quadrant (image 23/2). No evidence cecal dilatation or obstruction. Post appendectomy. There is some fluid along the RIGHT lower quadrant which is moderately high-density just above simple fluid density. This fluid extends in the RIGHT pelvis. Ascending, transverse and descending colon normal. Rectum normal. Several diverticular the descending colon without acute inflammation  Vascular/Lymphatic: Abdominal aorta is normal caliber. No periportal or retroperitoneal adenopathy. No pelvic adenopathy.  Reproductive: Post hysterectomy. There is fluid in the RIGHT adnexal region. The RIGHT ovary is difficult  to identify. On previous there are clips adjacent adjacent to the RIGHT ovary. There is ovoid soft tissue mass in this same region measuring 4.7 by 3.0 cm by 6.0 (Image 67/7, image 46/5). There is fluid adjacent to this rounded soft tissue mass which is high-density. This mass lesion is immediately associated with the small bowel loops. Fluid extends to the pelvis.  The LEFT ovary is not identified.  Post hysterectomy.  Other: No intraperitoneal free air.  Musculoskeletal: No aggressive osseous lesion.  IMPRESSION: 1. Inflammatory process in the RIGHT lower quadrant / RIGHT adnexal region. RIGHT ovary is poorly defined and difficult to separate from the small bowel but concern for enlargement of the RIGHT ovary as described above. Differential for RIGHT adnexa process would include RIGHT TUBO-OVARIAN ABSCESS AND OVARIAN TORSION TORSION. Recommend pelvic ultrasound for further evaluation. 2. Cecum has migrated into the LEFT upper quadrant. No evidence obstruction or volvulus. The migration of the bowel could potentially  result in vascular compromise to the RIGHT ovary. 3. Post appendectomy and cholecystectomy. Dilatation of the common bile duct is similar to prior and favored benign post cholecystectomy dilatation.  Electronically Signed   By: Genevive Bi M.D.   On: 12/30/2018 15:43 Note: Reviewed        Physical Exam  General appearance: Well nourished, well developed, and well hydrated. In no apparent acute distress Mental status: Alert, oriented x 3 (person, place, & time)       Respiratory: No evidence of acute respiratory distress Eyes: PERLA Vitals: BP (!) 150/103   Pulse 77   Temp (!) 97.1 F (36.2 C)   Ht 5\' 6"  (1.676 m)   Wt 214 lb (97.1 kg)   SpO2 100%   BMI 34.54 kg/m  BMI: Estimated body mass index is 34.54 kg/m as calculated from the following:   Height as of this encounter: 5\' 6"  (1.676 m).   Weight as of this encounter: 214 lb (97.1 kg). Ideal: Ideal body weight: 59.3 kg (130 lb 11.7 oz) Adjusted ideal body weight: 74.4 kg (164 lb 0.6 oz)  Lower Extremity Exam    Side: Right lower extremity  Side: Left lower extremity  Stability: No instability observed          Stability: No instability observed          Skin & Extremity Inspection: Evidence of prior arthroplastic surgery  Skin & Extremity Inspection: Skin color, temperature, and hair growth are WNL. No peripheral edema or cyanosis. No masses, redness, swelling, asymmetry, or associated skin lesions. No contractures.  Functional ROM: Unrestricted ROM                  Functional ROM: Improved after treatment                  Muscle Tone/Strength: Functionally intact. No obvious neuro-muscular anomalies detected.  Muscle Tone/Strength: Functionally intact. No obvious neuro-muscular anomalies detected.  Sensory (Neurological): Unimpaired        Sensory (Neurological): Arthropathic arthralgia, improved after treatment        DTR: Patellar: deferred today Achilles: deferred today Plantar: deferred today  DTR: Patellar:  deferred today Achilles: deferred today Plantar: deferred today  Palpation: No palpable anomalies  Palpation: No palpable anomalies    Assessment   Status Diagnosis  Controlled Controlled Controlled 1. Chronic pain of left knee   2. Primary osteoarthritis of left knee   3. Pain management contract signed   4. Fibromyalgia   5. Status post total right  knee replacement   6. Psoriatic arthritis ([place])   7. Chronic pain syndrome      Updated Problems: Problem  Fibromyalgia  Chronic Pain of Left Knee  Status Post Total Right Knee Replacement  Psoriatic Arthritis ([place])  Neuropathy   Overview:  Unknown cause, but did start after knee surgery on Rt, but has since moved to sx on Lt as well.   Obesity (Bmi 30-39.9)  Incomplete Tear of Right Rotator Cuff   Overview:  Being followed by ortho.  With associated chronic pain.     Plan of Care  [name] has a current medication list which includes the following long-term medication(s): cetirizine, duloxetine, furosemide, gabapentin, humira pen, omeprazole, and rosuvastatin.  Pharmacotherapy (Medications Ordered): Meds ordered this encounter  Medications  . traMADol (ULTRAM) 50 MG tablet    Sig: Take 1 tablet (50 mg total) by mouth 2 (two) times daily.    Dispense:  60 tablet    Refill:  2    For chronic pain syndrome   Orders:  Orders Placed This Encounter  Procedures  . KNEE INJECTION    For knee pain. Hyalgan Knee injection(s). Please order hyalgan to be available.    Standing Status:   Standing    Number of Occurrences:   5    Standing Expiration Date:   12/09/2021    Scheduling Instructions:     Procedure: Intra-articular Hyalgan Knee injection            Side(s): Bilateral Knee     Sedation: None     TIMEFRAME: PRN procedure. (Ms. Wiles will call when needed.)    Order Specific Question:   Where will this procedure be performed?    Answer:   [facility]   Continue Cymbalta 60 mg daily, gabapentin  300 mg twice daily, ibuprofen 800 mg twice daily as needed.  Follow-up plan:   Return in about 3 months (around 03/09/2021) for Medication Management, in person.   Recent Visits Date Type Provider Dept  11/08/20 Procedure visit Gillis Santa, MD [facility]  09/29/20 Procedure visit Gillis Santa, MD [facility]  09/13/20 Procedure visit Gillis Santa, MD [facility]  Showing recent visits within past 90 days and meeting all other requirements Today's Visits Date Type Provider Dept  12/09/20 Office Visit Gillis Santa, MD [facility]  Showing today's visits and meeting all other requirements Future Appointments No visits were found meeting these conditions. Showing future appointments within next 90 days and meeting all other requirements  I discussed the assessment and treatment plan with the patient. The patient was provided an opportunity to ask questions and all were answered. The patient agreed with the plan and demonstrated an understanding of the instructions.  Patient advised to call back or seek an in-person evaluation if the symptoms or condition worsens.  Duration of encounter: 30 minutes.  Note by: Gillis Santa, MD Date: 12/09/2020; Time: 1:24 PM

## 2021-01-17 ENCOUNTER — Other Ambulatory Visit: Payer: Self-pay

## 2021-01-17 ENCOUNTER — Ambulatory Visit
Admission: RE | Admit: 2021-01-17 | Discharge: 2021-01-17 | Disposition: A | Discharge status: Home / Self Care | Payer: 59 | Source: Ambulatory Visit | Attending: Sports Medicine | Admitting: Sports Medicine

## 2021-01-17 VITALS — BP 141/96 | HR 77 | Temp 98.4°F | Resp 16

## 2021-01-17 DIAGNOSIS — N390 Urinary tract infection, site not specified: Secondary | ICD-10-CM | POA: Diagnosis not present

## 2021-01-17 DIAGNOSIS — R350 Frequency of micturition: Secondary | ICD-10-CM | POA: Diagnosis not present

## 2021-01-17 DIAGNOSIS — R300 Dysuria: Secondary | ICD-10-CM

## 2021-01-17 LAB — URINALYSIS, COMPLETE (UACMP) WITH MICROSCOPIC
Bilirubin Urine: NEGATIVE
Glucose, UA: NEGATIVE mg/dL
Ketones, ur: NEGATIVE mg/dL
Nitrite: NEGATIVE
Protein, ur: 30 mg/dL — AB
Specific Gravity, Urine: >1.03 — ABNORMAL HIGH (ref 1.005–1.030)
WBC, UA: >50 WBC/hpf (ref 0–5)
pH: 5.5 (ref 5.0–8.0)

## 2021-01-17 MED ORDER — NITROFURANTOIN MONOHYD MACRO 100 MG PO CAPS: 100.0000 mg | ORAL_CAPSULE | Freq: Two times a day (BID) | ORAL | 0 refills | Status: DC

## 2021-01-17 NOTE — Discharge Instructions (Addendum)
Your urine does show that you have a urinary tract infection.  I sent off the urine for culture.  We will empirically start you on an antibiotic.  If the culture grows a bacterial organism that is resistant to the antibiotic  someone will contact you and change the antibiotic. Gave the educational handout. Please flush your system with plenty of fluids including water, cranberry juice.  You can also use Azo for any discomfort. I did give you a work note just saying you were seen today. If your symptoms were to worsen in any way please go to the emergency room to seek out a higher level of care.  I hope you get to feeling better, Dr. Drema Dallas

## 2021-01-17 NOTE — ED Triage Notes (Signed)
Patient presents to Urgent Care with complaints of possible UTI. Pt reports increased freq., dysuria, hematuria, and strong urine odor. Pt states she has a hx of UTIs. Treating pain with motrin and pain meds.   Denies fever or abdominal pain.

## 2021-01-19 NOTE — ED Provider Notes (Signed)
[facility]    CSN: [id number] Arrival date & time: 01/17/21  1647      History   Chief Complaint Chief Complaint  Patient presents with  . Dysuria  . Appointment    1700    HPI Amy Logan is a 50 y.o. female.   Patient pleasant 50 year old female who presents for evaluation of the above issue.  She has had 3 to 4 days of pain on urination with burning increased frequency urgency and decreased volume when she urinates.  She is also having some mild left-sided flank discomfort.  She has been drinking a lot of water.  She has not been using Azo or cranberry juice.  She also notes a strong odor to her urine.  She does have a history of UTIs in the past.  Just been using Motrin for her discomfort.  She denies any abdominal pain.  No nausea vomiting diarrhea, fever shakes or chills.  No chest pain or shortness of breath.  No red flag signs or symptoms elicited on history.     Past Medical History:  Diagnosis Date  . Anemia   . Anxiety   . Arthritis   . Bradycardia   . Cancer ([place]) 1990   cervical cells stage 4  . Fibromyalgia   . History of hiatal hernia    SMALL  . History of kidney stones    H/O  . Hypertension   . Neuropathy     Patient Active Problem List   Diagnosis Date Noted  . Fibromyalgia 08/18/2020  . Chronic pain of left knee 08/18/2020  . 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 Psoriatic arthritis ([place]) 08/18/2020  . RLQ abdominal pain 12/30/2018  . Leukocytosis 12/30/2018  . Anxiety and depression 10/24/2018  . Arthritis 10/24/2018  . Chronic, continuous use of opioids 10/24/2018  . Chronic insomnia 10/24/2018  . Hyperlipidemia 10/24/2018  . Hypertension 10/24/2018  . Neuropathy 10/24/2018  . Obesity (BMI 30-39.9) 10/24/2018  . H/O excision of epidermal inclusion cyst 10/24/2018  . SIRS (systemic inflammatory response syndrome) ([place]) 10/14/2018  . Epidermal inclusion cyst   . Mass of soft tissue of abdomen 09/30/2018   . B12 deficiency 11/20/2016  . Vitamin D insufficiency 11/20/2016  . Hx of normocytic normochromic anemia 12/20/2015  . Incomplete tear of right rotator cuff 11/21/2015    Past Surgical History:  Procedure Laterality Date  . ABDOMINAL HYSTERECTOMY    . APPENDECTOMY    . BREAST CYST ASPIRATION Left    neg  . CHOLECYSTECTOMY    . COLONOSCOPY  2018  . cyst removed     thyroglossal duct cyst  . JOINT REPLACEMENT    . LIPOMA EXCISION Right 10/09/2018   NOT A LIPOMA.  EPIDERMAL INCLUSION CYST  . REPLACEMENT TOTAL KNEE     Right knee    OB History   No obstetric history on file.      Home Medications    Prior to Admission medications   Medication Sig Start Date End Date Taking? Authorizing Provider  nitrofurantoin, macrocrystal-monohydrate, (MACROBID) 100 MG capsule Take 1 capsule (100 mg total) by mouth 2 (two) times daily. 01/17/21  Yes Verda Cumins, MD  cetirizine (ZYRTEC) 10 MG tablet Take 10 mg by mouth daily as needed for allergies.    [provider]  cyanocobalamin (,VITAMIN B-12,) 1000 MCG/ML injection Inject 1,000 mcg into the muscle every 30 (thirty) days. 06/02/20   [provider]  DULoxetine (CYMBALTA) 60 MG capsule Take 60  mg by mouth at bedtime.    [provider]  folic acid (FOLVITE) 1 MG tablet Take by mouth. 04/15/20 04/15/21  [provider]  furosemide (LASIX) 20 MG tablet Take 20 mg by mouth daily. 10/28/20   [provider]  gabapentin (NEURONTIN) 300 MG capsule Take 1 capsule (300 mg total) by mouth 2 (two) times daily. 09/29/20   Gillis Santa, MD  HUMIRA PEN 40 MG/0.4ML PNKT SMARTSIG:40 Milligram(s) SUB-Q Every 2 Weeks 05/12/20   [provider]  ibuprofen (ADVIL) 800 MG tablet Take 800 mg by mouth 2 (two) times daily as needed.    [provider]  lisinopril-hydrochlorothiazide (PRINZIDE,ZESTORETIC) 20-25 MG tablet Take 1 tablet by mouth every morning.    [provider]   methotrexate 50 MG/2ML injection Inject into the skin. 05/01/20   [provider]  omeprazole (PRILOSEC) 40 MG capsule Take 40 mg by mouth daily. 07/23/20   [provider]  rosuvastatin (CRESTOR) 5 MG tablet Take by mouth. 04/07/20   [provider]  traMADol (ULTRAM) 50 MG tablet Take 1 tablet (50 mg total) by mouth 2 (two) times daily. 12/23/20 03/23/21  Gillis Santa, MD  triamcinolone (KENALOG) 0.025 % cream Apply 1 application topically 2 (two) times daily.    [provider]    Family History Family History  Problem Relation Age of Onset  . Diabetes Mother   . Heart failure Mother   . Asthma Mother   . Cancer Father   . Breast cancer Cousin 47       mat cousin    Social History Social History   Tobacco Use  . Smoking status: Current Every Day Smoker    Packs/day: 0.50    Years: 25.00    Pack years: 12.50    Types: Cigarettes  . Smokeless tobacco: Never Used  Vaping Use  . Vaping Use: Never used  Substance Use Topics  . Alcohol use: Yes    Alcohol/week: 0.0 standard drinks    Comment: social  . Drug use: No     Allergies   Onion, Coconut flavor, Demerol [meperidine], Morphine and related, Phenergan [promethazine hcl], Stadol [butorphanol], Zofran [ondansetron hcl], and Imitrex [sumatriptan]   Review of Systems Review of Systems  Constitutional: Negative for activity change, appetite change, chills, diaphoresis, fatigue and fever.  HENT: Negative.   Respiratory: Negative.   Cardiovascular: Negative.   Gastrointestinal: Negative for abdominal pain, constipation, diarrhea, nausea and vomiting.  Genitourinary: Positive for dysuria, flank pain, frequency and urgency. Negative for hematuria, pelvic pain, vaginal bleeding, vaginal discharge and vaginal pain.  Skin: Negative.   Neurological: Negative for dizziness, tremors, seizures, weakness, light-headedness and numbness.  All other systems reviewed and are negative.    Physical  Exam Triage Vital Signs ED Triage Vitals  Enc Vitals Group     BP 01/17/21 1708 (!) 141/96     Pulse Rate 01/17/21 1708 77     Resp 01/17/21 1708 16     Temp 01/17/21 1708 98.4 F (36.9 C)     Temp Source 01/17/21 1708 Oral     SpO2 01/17/21 1708 100 %     Weight --      Height --      Head Circumference --      Peak Flow --      Pain Score 01/17/21 1704 4     Pain Loc --      Pain Edu? --      Excl. in [place]? --  No data found.  Updated Vital Signs BP (!) 141/96 (BP Location: Right Arm)   Pulse 77   Temp 98.4 F (36.9 C) (Oral)   Resp 16   SpO2 100%   Visual Acuity Right Eye Distance:   Left Eye Distance:   Bilateral Distance:    Right Eye Near:   Left Eye Near:    Bilateral Near:     Physical Exam Vitals and nursing note reviewed.  Constitutional:      General: She is not in acute distress.    Appearance: Normal appearance. She is not ill-appearing, toxic-appearing or diaphoretic.  HENT:     Head: Normocephalic and atraumatic.     Mouth/Throat:     Mouth: Mucous membranes are moist.  Eyes:     Pupils: Pupils are equal, round, and reactive to light.  Cardiovascular:     Rate and Rhythm: Normal rate and regular rhythm.     Pulses: Normal pulses.     Heart sounds: Normal heart sounds.  Pulmonary:     Effort: Pulmonary effort is normal.     Breath sounds: Normal breath sounds.  Abdominal:     General: There is no distension.     Tenderness: There is no abdominal tenderness. There is no right CVA tenderness, left CVA tenderness, guarding or rebound.  Skin:    General: Skin is warm and dry.     Capillary Refill: Capillary refill takes less than 2 seconds.  Neurological:     General: No focal deficit present.     Mental Status: She is alert and oriented to person, place, and time.      UC Treatments / Results  Labs (all labs ordered are listed, but only abnormal results are displayed) Labs Reviewed  URINE CULTURE - Abnormal; Notable for the  following components:      Result Value   Culture   (*)    Value: 80,000 COLONIES/mL ESCHERICHIA COLI SUSCEPTIBILITIES TO FOLLOW Performed at [facility], [address] [place], [place] [address]    All other components within normal limits  URINALYSIS, COMPLETE (UACMP) WITH MICROSCOPIC - Abnormal; Notable for the following components:   APPearance HAZY (*)    Specific Gravity, Urine >1.030 (*)    Hgb urine dipstick MODERATE (*)    Protein, ur 30 (*)    Leukocytes,Ua TRACE (*)    Bacteria, UA FEW (*)    All other components within normal limits    EKG   Radiology No results found.  Procedures Procedures (including critical care time)  Medications Ordered in UC Medications - No data to display  Initial Impression / Assessment and Plan / UC Course  I have reviewed the triage vital signs and the nursing notes.  Pertinent labs & imaging results that were available during my care of the patient were reviewed by me and considered in my medical decision making (see chart for details).  Clinical impression: 3 to 4 days of UTI symptoms.  UA above shows hazy appearance to her urine along with leukocytes and bacteria.  Will treat accordingly.  Treatment plan: 1.  The findings and treatment plan were discussed in detail with the patient.  Patient was in agreement. 2.  We will go ahead and start her on Macrobid twice daily for 5 days. 3.  Send off the urine culture await the results. 4.  Educational handout provided. 5.  Plenty of rest, plenty of fluids, Tylenol Motrin for fever or discomfort.  She can also  use Azo.  Have encouraged her to drink plenty of water and cranberry juice. 6.  Gave her a work note just saying she was seen today.  She can return to work tomorrow.    Final Clinical Impressions(s) / UC Diagnoses   Final diagnoses:  Lower urinary tract infectious disease  Dysuria  Urinary frequency     Discharge Instructions     Your urine does show that you  have a urinary tract infection.  I sent off the urine for culture.  We will empirically start you on an antibiotic.  If the culture grows a bacterial organism that is resistant to the antibiotic  someone will contact you and change the antibiotic. Gave the educational handout. Please flush your system with plenty of fluids including water, cranberry juice.  You can also use Azo for any discomfort. I did give you a work note just saying you were seen today. If your symptoms were to worsen in any way please go to the emergency room to seek out a higher level of care.  I hope you get to feeling better, Dr. Drema Dallas    ED Prescriptions    Medication Sig Dispense Auth. Provider   nitrofurantoin, macrocrystal-monohydrate, (MACROBID) 100 MG capsule Take 1 capsule (100 mg total) by mouth 2 (two) times daily. 10 capsule Verda Cumins, MD     PDMP not reviewed this encounter.   Verda Cumins, MD 01/19/21 [phone]

## 2021-01-20 LAB — URINE CULTURE: Culture: 80000 — AB

## 2021-03-08 ENCOUNTER — Other Ambulatory Visit: Payer: Self-pay

## 2021-03-08 ENCOUNTER — Encounter: Payer: Self-pay | Admitting: Student in an Organized Health Care Education/Training Program

## 2021-03-08 ENCOUNTER — Ambulatory Visit
Payer: 59 | Attending: Student in an Organized Health Care Education/Training Program | Admitting: Student in an Organized Health Care Education/Training Program

## 2021-03-08 VITALS — BP 154/92 | HR 72 | Temp 97.0°F | Resp 16 | Ht 66.0 in | Wt 222.0 lb

## 2021-03-08 DIAGNOSIS — G8929 Other chronic pain: Secondary | ICD-10-CM

## 2021-03-08 DIAGNOSIS — E669 Obesity, unspecified: Secondary | ICD-10-CM | POA: Diagnosis present

## 2021-03-08 DIAGNOSIS — M797 Fibromyalgia: Secondary | ICD-10-CM

## 2021-03-08 DIAGNOSIS — G894 Chronic pain syndrome: Secondary | ICD-10-CM

## 2021-03-08 DIAGNOSIS — Z0289 Encounter for other administrative examinations: Secondary | ICD-10-CM

## 2021-03-08 DIAGNOSIS — L4050 Arthropathic psoriasis, unspecified: Secondary | ICD-10-CM

## 2021-03-08 DIAGNOSIS — Z96651 Presence of right artificial knee joint: Secondary | ICD-10-CM | POA: Diagnosis not present

## 2021-03-08 DIAGNOSIS — E538 Deficiency of other specified B group vitamins: Secondary | ICD-10-CM | POA: Diagnosis present

## 2021-03-08 DIAGNOSIS — M1712 Unilateral primary osteoarthritis, left knee: Secondary | ICD-10-CM

## 2021-03-08 DIAGNOSIS — M25562 Pain in left knee: Secondary | ICD-10-CM | POA: Diagnosis present

## 2021-03-08 MED ORDER — TRAMADOL HCL 50 MG PO TABS: 100.0000 mg | ORAL_TABLET | Freq: Two times a day (BID) | ORAL | 2 refills | Status: AC

## 2021-03-08 MED ORDER — GABAPENTIN 300 MG PO CAPS: 300.0000 mg | ORAL_CAPSULE | Freq: Two times a day (BID) | ORAL | 5 refills | Status: DC

## 2021-03-08 MED ORDER — DULOXETINE HCL 30 MG PO CPEP: 30.0000 mg | ORAL_CAPSULE | Freq: Every day | ORAL | 2 refills | Status: DC

## 2021-03-08 NOTE — Progress Notes (Signed)
PROVIDER NOTE: Information contained herein reflects review and annotations entered in association with encounter. Interpretation of such information and data should be left to medically-trained personnel. Information provided to patient can be located elsewhere in the medical record under "Patient Instructions". Document created using STT-dictation technology, any transcriptional errors that may result from process are unintentional.    Patient: Amy Logan  Service Category: E/M  Provider: Gillis Santa, MD  DOB: 1971/09/23  DOS: 03/08/2021  Specialty: Interventional Pain Management  MRN: [id number]  Setting: Ambulatory outpatient  PCP: Sallee Lange, NP  Type: Established Patient    Referring Provider: Sallee Lange, *  Location: Office  Delivery: Face-to-face     HPI  [name], a 50 y.o. year old female, is here today because of her Primary osteoarthritis of left knee [M17.12]. [name] primary complain today is Knee Pain (Left caused by fibromyalgia and psoriatic arthritis) Last encounter: My last encounter with her was on 12/09/2020. Pertinent problems: [name] has Incomplete tear of right rotator cuff; Neuropathy; Obesity (BMI 30-39.9); Fibromyalgia; Chronic pain of left knee; Status post total right knee replacement; and Psoriatic arthritis ([place]) on their pertinent problem list. Pain Assessment: Severity of Chronic pain is reported as a 7 /10. Location: Knee Left/patient has pain all over body caused by fibromyalgia. Onset: More than a month ago. Quality: Discomfort,Constant. Timing: Constant. Modifying factor(s): nothing currently. Vitals:  height is $RemoveB'5\' 6"'LODdkrjD$  (1.676 m) and weight is 222 lb (100.7 kg). Her temporal temperature is 97 F (36.1 C) (abnormal). Her blood pressure is 154/92 (abnormal) and her pulse is 72. Her respiration is 16 and oxygen saturation is 100%.   Reason for encounter: medication management.    [name] follows up today for medication management.   No significant change in her medical history since her last visit except that she discontinued her Cymbalta.  She states that she was unable to get a refill from her primary care provider.  She states that it did not provide much pain relief for her although she does endorse overall worsening of her pain.  I believe the patient was experiencing analgesic benefit given an overall increase in her pain since stopping the Cymbalta.  She also states that tramadol at 50 mg twice a day is not as effective.  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  She averages 120-130 hours every 2 weeks.  She states that this is a taking a toll on her but she has to work.  I recommend that she restart her Cymbalta at 30 mg and we will increase her tramadol to 100 mg twice daily.  Pharmacotherapy Assessment   Analgesic: Tramadol 100 mg twice daily as needed, quantity 120/month    Monitoring: [place] PMP: PDMP reviewed during this encounter.       Pharmacotherapy: No side-effects or adverse reactions reported. Compliance: No problems identified. Effectiveness: Clinically acceptable.  Amy Billow, RN  03/08/2021  2:51 PM  Sign when Signing Visit Nursing Pain Medication Assessment:  Safety precautions to be maintained throughout the outpatient stay will include: orient to surroundings, keep bed in low position, maintain call bell within reach at all times, provide assistance with transfer out of bed and ambulation.  Medication Inspection Compliance: Pill count conducted under aseptic conditions, in front of the patient. Neither the pills nor the bottle was removed from the patient's sight at any time. Once count was completed pills were immediately returned to the  patient in their original bottle.  Medication: Tramadol (Ultram) Pill/Patch Count: 29 of 60 pills remain Pill/Patch Appearance: Markings consistent with prescribed medication Bottle Appearance: Standard  pharmacy container. Clearly labeled. Filled Date:02/21/2021 Last Medication intake:  Today    UDS:  Summary  Date Value Ref Range Status  08/19/2020 Note  Final    Comment:    ==================================================================== Compliance Drug Analysis, Ur ==================================================================== Test                             Result       Flag       Units  Drug Present and Declared for Prescription Verification   Tramadol                       293          EXPECTED   ng/mg creat   O-Desmethyltramadol            1042         EXPECTED   ng/mg creat   N-Desmethyltramadol            107          EXPECTED   ng/mg creat    Source of tramadol is a prescription medication. O-desmethyltramadol    and N-desmethyltramadol are expected metabolites of tramadol.  Drug Present not Declared for Prescription Verification   Naproxen                       PRESENT      UNEXPECTED   Diphenhydramine                PRESENT      UNEXPECTED  Drug Absent but Declared for Prescription Verification   Gabapentin                     Not Detected UNEXPECTED   Duloxetine                     Not Detected UNEXPECTED   Ibuprofen                      Not Detected UNEXPECTED    Ibuprofen, as indicated in the declared medication list, is not    always detected even when used as directed.  ==================================================================== Test                      Result    Flag   Units      Ref Range   Creatinine              113              mg/dL      >=20 ==================================================================== Declared Medications:  The flagging and interpretation on this report are based on the  following declared medications.  Unexpected results may arise from  inaccuracies in the declared medications.   **Note: The testing scope of this panel includes these medications:   Duloxetine (Cymbalta)  Gabapentin  Tramadol  (Ultram)   **Note: The testing scope of this panel does not include small to  moderate amounts of these reported medications:   Ibuprofen (Advil)   **Note: The testing scope of this panel does not include the  following reported medications:   Adalimumab (Humira)  Cetirizine (Zyrtec)  Folic Acid  Hydrochlorothiazide  Lisinopril  Methotrexate  Omeprazole  Rosuvastatin  Triamcinolone (Kenalog)  Vitamin B12 ==================================================================== For clinical consultation, please call [phone]. ====================================================================      ROS  Constitutional: Denies any fever or chills Gastrointestinal: No reported hemesis, hematochezia, vomiting, or acute GI distress Musculoskeletal: Bilateral knee pain Neurological: No reported episodes of acute onset apraxia, aphasia, dysarthria, agnosia, amnesia, paralysis, loss of coordination, or loss of consciousness  Medication Review  Adalimumab, DULoxetine, cetirizine, folic acid, furosemide, gabapentin, ibuprofen, lisinopril-hydrochlorothiazide, naproxen sodium, omeprazole, rosuvastatin, traMADol, and triamcinolone  History Review  Allergy: [name] is allergic to onion, coconut flavor, demerol [meperidine], morphine and related, phenergan [promethazine hcl], stadol [butorphanol], zofran [ondansetron hcl], and imitrex [sumatriptan]. Drug: [name]  reports no history of drug use. Alcohol:  reports current alcohol use. Tobacco:  reports that she has been smoking cigarettes. She has a 12.50 pack-year smoking history. She has never used smokeless tobacco. Social: [name]  reports that she has been smoking cigarettes. She has a 12.50 pack-year smoking history. She has never used smokeless tobacco. She reports current alcohol use. She reports that she does not use drugs. Medical:  has a past medical history of Anemia, Anxiety, Arthritis, Bradycardia, Cancer ([place]) (1990),  Fibromyalgia, History of hiatal hernia, History of kidney stones, Hypertension, and Neuropathy. Surgical: [name]  has a past surgical history that includes Cholecystectomy; Appendectomy; Replacement total knee; Abdominal hysterectomy; Joint replacement; Breast cyst aspiration (Left); Colonoscopy (2018); cyst removed; and Lipoma excision (Right, 10/09/2018). Family: family history includes Asthma in her mother; Breast cancer (age of onset: 90) in her cousin; Cancer in her father; Diabetes in her mother; Heart failure in her mother.  Laboratory Chemistry Profile   Renal Lab Results  Component Value Date   BUN 8 12/31/2018   CREATININE 0.71 12/31/2018   GFRAA >60 12/31/2018   GFRNONAA >60 12/31/2018     Hepatic Lab Results  Component Value Date   AST 16 12/30/2018   ALT 14 12/30/2018   ALBUMIN 4.4 12/30/2018   ALKPHOS 46 12/30/2018   LIPASE 24 12/30/2018     Electrolytes Lab Results  Component Value Date   NA 138 12/31/2018   K 3.5 12/31/2018   CL 104 12/31/2018   CALCIUM 8.6 (L) 12/31/2018   MG 2.1 03/06/2013     Bone No results found for: VD25OH, VD125OH2TOT, HE1740CX4, GY1856DJ4, 25OHVITD1, 25OHVITD2, 25OHVITD3, TESTOFREE, TESTOSTERONE   Inflammation (CRP: Acute Phase) (ESR: Chronic Phase) Lab Results  Component Value Date   LATICACIDVEN 1.5 12/30/2018       Note: Above Lab results reviewed.  Recent Imaging Review  MR Abdomen W or Wo Contrast CLINICAL DATA:  Right-sided abdominal pain.  "Abdominal infection".  EXAM: MRI ABDOMEN WITHOUT AND WITH CONTRAST  TECHNIQUE: Multiplanar multisequence MR imaging of the abdomen was performed both before and after the administration of intravenous contrast.  CONTRAST:  7.5 cc Gadavist  COMPARISON:  Today's pelvic ultrasound and abdominopelvic CTs, dictated separately.  FINDINGS: Portions of exam are minimally motion degraded.  Lower chest: Small hiatal hernia. Normal heart size without pericardial or pleural  effusion.  Hepatobiliary: Hepatomegaly, 22.4 cm craniocaudal. No focal liver lesion.  Cholecystectomy. Common duct upper normal for prior cholecystectomy. Example at 10 mm in the porta hepatis. No obstructive stone or mass.  Pancreas:  Normal, without mass or ductal dilatation.  Spleen:  Normal in size, without focal abnormality.  Adrenals/Urinary Tract: Normal adrenal glands. Normal kidneys, without hydronephrosis.  Stomach/Bowel: Normal distal stomach and abdominal bowel loops.  Vascular/Lymphatic: Normal caliber of the aorta  and branch vessels. No abdominal adenopathy.  Other: Small volume right-sided abdominal fluid, including image 36/4.  Musculoskeletal: No acute osseous abnormality.  IMPRESSION: 1. Small volume abdominal fluid, as detailed on CT. 2. Otherwise, no acute process in the abdomen. 3. Hepatomegaly. 4. Cholecystectomy with borderline common duct dilatation, most likely within normal variation. No cause identified. 5. Small hiatal hernia.  Electronically Signed   By: Abigail Miyamoto M.D.   On: 12/30/2018 20:06 US PELVIS TRANSVANGINAL NON-OB (TV ONLY) CLINICAL DATA:  Right-sided pelvic pain post hysterectomy. Possible torsion.  EXAM: TRANSABDOMINAL AND TRANSVAGINAL ULTRASOUND OF PELVIS  TECHNIQUE: Both transabdominal and transvaginal ultrasound examinations of the pelvis were performed. Transabdominal technique was performed for global imaging of the pelvis including uterus, ovaries, adnexal regions, and pelvic cul-de-sac. It was necessary to proceed with endovaginal exam following the transabdominal exam to visualize the ovaries.  COMPARISON:  Abdominopelvic CT same date and 10/14/2018  FINDINGS: Uterus  Measurements: Surgically absent.  Right ovary  Measurements: Not visualized.  No adnexal mass seen.  Left ovary  Measurements: Not visualized.  No adnexal mass seen.  Other findings  A moderate amount of free pelvic fluid is  present.  IMPRESSION: 1. Neither ovary is visualized post hysterectomy. 2. Etiology for the right adnexal enlargement seen on earlier CT is not further clarified. 3. Nonspecific moderate free pelvic fluid.  Electronically Signed   By: Richardean Sale M.D.   On: 12/30/2018 17:04 US Pelvis Complete CLINICAL DATA:  Right-sided pelvic pain post hysterectomy. Possible torsion.  EXAM: TRANSABDOMINAL AND TRANSVAGINAL ULTRASOUND OF PELVIS  TECHNIQUE: Both transabdominal and transvaginal ultrasound examinations of the pelvis were performed. Transabdominal technique was performed for global imaging of the pelvis including uterus, ovaries, adnexal regions, and pelvic cul-de-sac. It was necessary to proceed with endovaginal exam following the transabdominal exam to visualize the ovaries.  COMPARISON:  Abdominopelvic CT same date and 10/14/2018  FINDINGS: Uterus  Measurements: Surgically absent.  Right ovary  Measurements: Not visualized.  No adnexal mass seen.  Left ovary  Measurements: Not visualized.  No adnexal mass seen.  Other findings  A moderate amount of free pelvic fluid is present.  IMPRESSION: 1. Neither ovary is visualized post hysterectomy. 2. Etiology for the right adnexal enlargement seen on earlier CT is not further clarified. 3. Nonspecific moderate free pelvic fluid.  Electronically Signed   By: Richardean Sale M.D.   On: 12/30/2018 17:04 CT ABDOMEN PELVIS W CONTRAST CLINICAL DATA:  Abdominal pain.  Leukocytosis.  EXAM: CT ABDOMEN AND PELVIS WITH CONTRAST  TECHNIQUE: Multidetector CT imaging of the abdomen and pelvis was performed using the standard protocol following bolus administration of intravenous contrast.  CONTRAST:  168mL OMNIPAQUE IOHEXOL 300 MG/ML  SOLN  COMPARISON:  CT 10/14/2018  FINDINGS: Lower chest: Lung bases are clear.  Hepatobiliary: Postcholecystectomy. Mild prominence of intrahepatic bile ducts similar comparison to  comparison exam. The common bile duct measures 9 mm (image 31/2 which compares to 9 mm on comparison exam.  Pancreas: No pancreatic duct dilatation. No pancreatic inflammation.  Spleen: Normal spleen  Adrenals/urinary tract: Adrenal glands and kidneys are normal. The ureters and bladder normal.  Stomach/Bowel: Stomach, small-bowel cecum normal. The cecum has been migrated into the LEFT upper quadrant (image 23/2). No evidence cecal dilatation or obstruction. Post appendectomy. There is some fluid along the RIGHT lower quadrant which is moderately high-density just above simple fluid density. This fluid extends in the RIGHT pelvis. Ascending, transverse and descending colon normal. Rectum normal. Several diverticular the descending  colon without acute inflammation  Vascular/Lymphatic: Abdominal aorta is normal caliber. No periportal or retroperitoneal adenopathy. No pelvic adenopathy.  Reproductive: Post hysterectomy. There is fluid in the RIGHT adnexal region. The RIGHT ovary is difficult to identify. On previous there are clips adjacent adjacent to the RIGHT ovary. There is ovoid soft tissue mass in this same region measuring 4.7 by 3.0 cm by 6.0 (Image 67/7, image 46/5). There is fluid adjacent to this rounded soft tissue mass which is high-density. This mass lesion is immediately associated with the small bowel loops. Fluid extends to the pelvis.  The LEFT ovary is not identified.  Post hysterectomy.  Other: No intraperitoneal free air.  Musculoskeletal: No aggressive osseous lesion.  IMPRESSION: 1. Inflammatory process in the RIGHT lower quadrant / RIGHT adnexal region. RIGHT ovary is poorly defined and difficult to separate from the small bowel but concern for enlargement of the RIGHT ovary as described above. Differential for RIGHT adnexa process would include RIGHT TUBO-OVARIAN ABSCESS AND OVARIAN TORSION TORSION. Recommend pelvic ultrasound for further  evaluation. 2. Cecum has migrated into the LEFT upper quadrant. No evidence obstruction or volvulus. The migration of the bowel could potentially result in vascular compromise to the RIGHT ovary. 3. Post appendectomy and cholecystectomy. Dilatation of the common bile duct is similar to prior and favored benign post cholecystectomy dilatation.  Electronically Signed   By: Suzy Bouchard M.D.   On: 12/30/2018 15:43 Note: Reviewed        Physical Exam  General appearance: Well nourished, well developed, and well hydrated. In no apparent acute distress Mental status: Alert, oriented x 3 (person, place, & time)       Respiratory: No evidence of acute respiratory distress Eyes: PERLA Vitals: BP (!) 154/92   Pulse 72   Temp (!) 97 F (36.1 C) (Temporal)   Resp 16   Ht $R'5\' 6"'NE$  (1.676 m)   Wt 222 lb (100.7 kg)   SpO2 100%   BMI 35.83 kg/m  BMI: Estimated body mass index is 35.83 kg/m as calculated from the following:   Height as of this encounter: $RemoveBeforeD'5\' 6"'LGCTSLTTPMKlba$  (1.676 m).   Weight as of this encounter: 222 lb (100.7 kg). Ideal: Ideal body weight: 59.3 kg (130 lb 11.7 oz) Adjusted ideal body weight: 75.9 kg (167 lb 3.8 oz)   Lower Extremity Exam    Side: Right lower extremity  Side: Left lower extremity  Stability: No instability observed          Stability: No instability observed          Skin & Extremity Inspection: Evidence of prior arthroplastic surgery  Skin & Extremity Inspection: Skin color, temperature, and hair growth are WNL. No peripheral edema or cyanosis. No masses, redness, swelling, asymmetry, or associated skin lesions. No contractures.  Functional ROM: Unrestricted ROM                  Functional ROM: Improved after treatment                  Muscle Tone/Strength: Functionally intact. No obvious neuro-muscular anomalies detected.  Muscle Tone/Strength: Functionally intact. No obvious neuro-muscular anomalies detected.  Sensory (Neurological): Unimpaired         Sensory (Neurological): Arthropathic arthralgia, improved after treatment        DTR: Patellar: deferred today Achilles: deferred today Plantar: deferred today  DTR: Patellar: deferred today Achilles: deferred today Plantar: deferred today  Palpation: No palpable anomalies  Palpation: No palpable anomalies  Assessment   Status Diagnosis  Controlled Controlled Controlled 1. Primary osteoarthritis of left knee   2. Chronic pain of left knee   3. Fibromyalgia   4. Status post total right knee replacement   5. Psoriatic arthritis ([place])   6. B12 deficiency   7. Obesity (BMI 30-39.9)   8. Pain management contract signed   9. Chronic pain syndrome      Updated Problems: Problem  Primary Osteoarthritis of Left Knee    Plan of Care  [name] has a current medication list which includes the following long-term medication(s): cetirizine, furosemide, humira pen, omeprazole, rosuvastatin, duloxetine, and gabapentin.  Pharmacotherapy (Medications Ordered): Meds ordered this encounter  Medications  . gabapentin (NEURONTIN) 300 MG capsule    Sig: Take 1 capsule (300 mg total) by mouth 2 (two) times daily.    Dispense:  60 capsule    Refill:  5  . traMADol (ULTRAM) 50 MG tablet    Sig: Take 2 tablets (100 mg total) by mouth 2 (two) times daily.    Dispense:  120 tablet    Refill:  2    For chronic pain syndrome  . DULoxetine (CYMBALTA) 30 MG capsule    Sig: Take 1 capsule (30 mg total) by mouth daily.    Dispense:  30 capsule    Refill:  2    Follow-up plan:   Return in about 3 months (around 06/07/2021) for Medication Management, in person.      Recent Visits Date Type Provider Dept  12/09/20 Office Visit Gillis Santa, MD [facility]  Showing recent visits within past 90 days and meeting all other requirements Today's Visits Date Type Provider Dept  03/08/21 Office Visit Gillis Santa, MD [facility]  Showing today's visits and meeting all  other requirements Future Appointments No visits were found meeting these conditions. Showing future appointments within next 90 days and meeting all other requirements  I discussed the assessment and treatment plan with the patient. The patient was provided an opportunity to ask questions and all were answered. The patient agreed with the plan and demonstrated an understanding of the instructions.  Patient advised to call back or seek an in-person evaluation if the symptoms or condition worsens.  Duration of encounter: 30 minutes.  Note by: Gillis Santa, MD Date: 03/08/2021; Time: 3:16 PM

## 2021-03-08 NOTE — Progress Notes (Signed)
Nursing Pain Medication Assessment:  Safety precautions to be maintained throughout the outpatient stay will include: orient to surroundings, keep bed in low position, maintain call bell within reach at all times, provide assistance with transfer out of bed and ambulation.  Medication Inspection Compliance: Pill count conducted under aseptic conditions, in front of the patient. Neither the pills nor the bottle was removed from the patient's sight at any time. Once count was completed pills were immediately returned to the patient in their original bottle.  Medication: Tramadol (Ultram) Pill/Patch Count: 29 of 60 pills remain Pill/Patch Appearance: Markings consistent with prescribed medication Bottle Appearance: Standard pharmacy container. Clearly labeled. Filled Date:02/21/2021 Last Medication intake:  Today

## 2021-04-01 ENCOUNTER — Other Ambulatory Visit: Payer: Self-pay

## 2021-04-01 ENCOUNTER — Ambulatory Visit
Admission: RE | Admit: 2021-04-01 | Discharge: 2021-04-01 | Disposition: A | Discharge status: Home / Self Care | Payer: 59 | Source: Ambulatory Visit | Attending: Physician Assistant | Admitting: Physician Assistant

## 2021-04-01 VITALS — BP 140/87 | HR 71 | Temp 98.7°F | Resp 18

## 2021-04-01 DIAGNOSIS — J3489 Other specified disorders of nose and nasal sinuses: Secondary | ICD-10-CM | POA: Diagnosis not present

## 2021-04-01 DIAGNOSIS — H66002 Acute suppurative otitis media without spontaneous rupture of ear drum, left ear: Secondary | ICD-10-CM | POA: Diagnosis not present

## 2021-04-01 DIAGNOSIS — R0981 Nasal congestion: Secondary | ICD-10-CM

## 2021-04-01 MED ORDER — AMOXICILLIN-POT CLAVULANATE 875-125 MG PO TABS: 1.0000 | ORAL_TABLET | Freq: Two times a day (BID) | ORAL | 0 refills | Status: AC

## 2021-04-01 NOTE — ED Triage Notes (Signed)
Pt is present today with a cough, nasal congestion,nasal drainage started four days ago. Pt states that she took a covid teste yesterday and the results were negative.

## 2021-04-01 NOTE — Discharge Instructions (Addendum)
-  Continue OTC decongestants and nasal sprays. Start amoxicillin for ear infection. F/u for worsening symptoms.

## 2021-04-01 NOTE — ED Provider Notes (Signed)
[facility]    CSN: [id number] Arrival date & time: 04/01/21  1357      History   Chief Complaint Chief Complaint  Patient presents with  . Cough  . Appointment    HPI Amy Logan is a 50 y.o. female presenting for 4-day history of headaches, sinus pressure, nasal congestion, postnasal drainage, bilateral ear pain.  Denies any cough or sore throat.  No fevers or fatigue.  No chest pain or shortness of breath.  Patient works in [employer].  She says that she took a at home COVID test and it was negative.  No known COVID exposure.  Has been taking over-the-counter Tylenol Cold and sinus severe and using Flonase without any significant changes or improvement in her symptoms.  No other concerns today.  HPI  Past Medical History:  Diagnosis Date  . Anemia   . Anxiety   . Arthritis   . Bradycardia   . Cancer ([place]) 1990   cervical cells stage 4  . Fibromyalgia   . History of hiatal hernia    SMALL  . History of kidney stones    H/O  . Hypertension   . Neuropathy     Patient Active Problem List   Diagnosis Date Noted  . Primary osteoarthritis of left knee 03/08/2021  . Fibromyalgia 08/18/2020  . Chronic pain of left knee 08/18/2020  . 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 Psoriatic arthritis ([place]) 08/18/2020  . RLQ abdominal pain 12/30/2018  . Leukocytosis 12/30/2018  . Anxiety and depression 10/24/2018  . Arthritis 10/24/2018  . Chronic, continuous use of opioids 10/24/2018  . Chronic insomnia 10/24/2018  . Hyperlipidemia 10/24/2018  . Hypertension 10/24/2018  . Neuropathy 10/24/2018  . Obesity (BMI 30-39.9) 10/24/2018  . H/O excision of epidermal inclusion cyst 10/24/2018  . SIRS (systemic inflammatory response syndrome) ([place]) 10/14/2018  . Epidermal inclusion cyst   . Mass of soft tissue of abdomen 09/30/2018  . B12 deficiency 11/20/2016  . Vitamin D insufficiency 11/20/2016  . Hx of normocytic normochromic anemia 12/20/2015  .  Incomplete tear of right rotator cuff 11/21/2015    Past Surgical History:  Procedure Laterality Date  . ABDOMINAL HYSTERECTOMY    . APPENDECTOMY    . BREAST CYST ASPIRATION Left    neg  . CHOLECYSTECTOMY    . COLONOSCOPY  2018  . cyst removed     thyroglossal duct cyst  . JOINT REPLACEMENT    . LIPOMA EXCISION Right 10/09/2018   NOT A LIPOMA.  EPIDERMAL INCLUSION CYST  . REPLACEMENT TOTAL KNEE     Right knee    OB History   No obstetric history on file.      Home Medications    Prior to Admission medications   Medication Sig Start Date End Date Taking? Authorizing Provider  amoxicillin-clavulanate (AUGMENTIN) 875-125 MG tablet Take 1 tablet by mouth every 12 (twelve) hours for 7 days. 04/01/21 04/08/21 Yes Danton Clap, PA-C  cetirizine (ZYRTEC) 10 MG tablet Take 10 mg by mouth daily as needed for allergies.    [provider]  DULoxetine (CYMBALTA) 30 MG capsule Take 1 capsule (30 mg total) by mouth daily. 03/08/21 06/06/21  Gillis Santa, MD  folic acid (FOLVITE) 1 MG tablet Take by mouth. 04/15/20 04/15/21  [provider]  furosemide (LASIX) 20 MG tablet Take 20 mg by mouth daily as needed. 10/28/20   [provider]  gabapentin (NEURONTIN) 300 MG capsule Take 1 capsule (300 mg total)  by mouth 2 (two) times daily. 03/08/21   Gillis Santa, MD  HUMIRA PEN 40 MG/0.4ML PNKT SMARTSIG:40 Milligram(s) SUB-Q Every 2 Weeks 05/12/20   [provider]  ibuprofen (ADVIL) 800 MG tablet Take 800 mg by mouth 2 (two) times daily as needed.    [provider]  lisinopril-hydrochlorothiazide (PRINZIDE,ZESTORETIC) 20-25 MG tablet Take 1 tablet by mouth every morning.    [provider]  naproxen sodium (ALEVE) 220 MG tablet Take 220 mg by mouth 2 (two) times daily with a meal.    [provider]  omeprazole (PRILOSEC) 40 MG capsule Take 40 mg by mouth daily. 07/23/20   [provider]  rosuvastatin (CRESTOR) 5 MG tablet Take  5 mg by mouth. 3 times per week 04/07/20   [provider]  traMADol (ULTRAM) 50 MG tablet Take 2 tablets (100 mg total) by mouth 2 (two) times daily. 03/08/21 06/06/21  Gillis Santa, MD  triamcinolone (KENALOG) 0.025 % cream Apply 1 application topically 2 (two) times daily.    [provider]    Family History Family History  Problem Relation Age of Onset  . Diabetes Mother   . Heart failure Mother   . Asthma Mother   . Cancer Father   . Breast cancer Cousin 56       mat cousin    Social History Social History   Tobacco Use  . Smoking status: Current Every Day Smoker    Packs/day: 0.50    Years: 25.00    Pack years: 12.50    Types: Cigarettes  . Smokeless tobacco: Never Used  Vaping Use  . Vaping Use: Never used  Substance Use Topics  . Alcohol use: Yes    Alcohol/week: 0.0 standard drinks    Comment: social  . Drug use: No     Allergies   Onion, Coconut flavor, Demerol [meperidine], Morphine and related, Phenergan [promethazine hcl], Stadol [butorphanol], Zofran [ondansetron hcl], and Imitrex [sumatriptan]   Review of Systems Review of Systems  Constitutional: Negative for chills, diaphoresis, fatigue and fever.  HENT: Positive for congestion, ear pain, postnasal drip, rhinorrhea, sinus pressure and sinus pain. Negative for sore throat.   Respiratory: Negative for cough and shortness of breath.   Cardiovascular: Negative for chest pain.  Gastrointestinal: Negative for abdominal pain, nausea and vomiting.  Musculoskeletal: Negative for arthralgias and myalgias.  Skin: Negative for rash.  Neurological: Positive for headaches. Negative for weakness.  Hematological: Negative for adenopathy.     Physical Exam Triage Vital Signs ED Triage Vitals  Enc Vitals Group     BP 04/01/21 1409 140/87     Pulse Rate 04/01/21 1409 71     Resp 04/01/21 1409 18     Temp 04/01/21 1409 98.7 F (37.1 C)     Temp Source 04/01/21 1409 Oral     SpO2 04/01/21  1409 99 %     Weight --      Height --      Head Circumference --      Peak Flow --      Pain Score 04/01/21 1408 0     Pain Loc --      Pain Edu? --      Excl. in [place]? --    No data found.  Updated Vital Signs BP 140/87 (BP Location: Left Arm)   Pulse 71   Temp 98.7 F (37.1 C) (Oral)   Resp 18   SpO2 99%       Physical  Exam Vitals and nursing note reviewed.  Constitutional:      General: She is not in acute distress.    Appearance: Normal appearance. She is not ill-appearing or toxic-appearing.  HENT:     Head: Normocephalic and atraumatic.     Right Ear: Hearing, tympanic membrane, ear canal and external ear normal.     Left Ear: Hearing, ear canal and external ear normal. Tympanic membrane is erythematous and bulging.     Nose: Congestion and rhinorrhea present.     Mouth/Throat:     Mouth: Mucous membranes are moist.     Pharynx: Oropharynx is clear.  Eyes:     General: No scleral icterus.       Right eye: No discharge.        Left eye: No discharge.     Conjunctiva/sclera: Conjunctivae normal.  Cardiovascular:     Rate and Rhythm: Normal rate and regular rhythm.     Heart sounds: Normal heart sounds.  Pulmonary:     Effort: Pulmonary effort is normal. No respiratory distress.     Breath sounds: Normal breath sounds.  Musculoskeletal:     Cervical back: Neck supple.  Skin:    General: Skin is dry.  Neurological:     General: No focal deficit present.     Mental Status: She is alert. Mental status is at baseline.     Motor: No weakness.     Gait: Gait normal.  Psychiatric:        Mood and Affect: Mood normal.        Behavior: Behavior normal.        Thought Content: Thought content normal.      UC Treatments / Results  Labs (all labs ordered are listed, but only abnormal results are displayed) Labs Reviewed - No data to display  EKG   Radiology No results found.  Procedures Procedures (including critical care time)  Medications Ordered  in UC Medications - No data to display  Initial Impression / Assessment and Plan / UC Course  I have reviewed the triage vital signs and the nursing notes.  Pertinent labs & imaging results that were available during my care of the patient were reviewed by me and considered in my medical decision making (see chart for details).   50 year old female presenting for 4-day history of sinus pressure and congestion.  Also has ear pain.  Negative at home COVID test.  Vitals are all stable in clinic today.  Exam reveals congestion and light yellowish rhinorrhea.  She does have erythema and bulging of the left TM.  Advised patient she likely has a viral illness but given the appearance of her ear, we will go ahead and treat her with Augmentin.  Encouraged her to continue with the over-the-counter decongestants and nasal sprays.  Work note given for today.  Advised to follow-up as needed for any worsening symptoms or if not better after she finishes the antibiotic.   Final Clinical Impressions(s) / UC Diagnoses   Final diagnoses:  Acute suppurative otitis media of left ear without spontaneous rupture of tympanic membrane, recurrence not specified  Sinus pressure  Nasal congestion     Discharge Instructions     -Continue OTC decongestants and nasal sprays. Start amoxicillin for ear infection. F/u for worsening symptoms.    ED Prescriptions    Medication Sig Dispense Auth. Provider   amoxicillin-clavulanate (AUGMENTIN) 875-125 MG tablet Take 1 tablet by mouth every 12 (twelve) hours for 7 days. 14 tablet  Danton Clap, PA-C     PDMP not reviewed this encounter.   Danton Clap, PA-C 04/01/21 1504

## 2021-05-20 ENCOUNTER — Ambulatory Visit
Admission: RE | Admit: 2021-05-20 | Discharge: 2021-05-20 | Disposition: A | Discharge status: Home / Self Care | Payer: 59 | Source: Ambulatory Visit | Attending: Family Medicine | Admitting: Family Medicine

## 2021-05-20 ENCOUNTER — Other Ambulatory Visit: Payer: Self-pay

## 2021-05-20 VITALS — BP 158/99 | HR 71 | Temp 98.6°F | Resp 18 | Ht 66.0 in | Wt 217.0 lb

## 2021-05-20 DIAGNOSIS — H73011 Bullous myringitis, right ear: Secondary | ICD-10-CM

## 2021-05-20 MED ORDER — AMOXICILLIN-POT CLAVULANATE 875-125 MG PO TABS
1.0000 | ORAL_TABLET | Freq: Two times a day (BID) | ORAL | 0 refills | Status: DC
Start: 2021-05-20 — End: 2022-01-19

## 2021-05-20 NOTE — ED Provider Notes (Signed)
[facility]    CSN: [id number] Arrival date & time: 05/20/21  1347  History   Chief Complaint Chief Complaint  Patient presents with   Ear Pain   HPI  50 year old female presents with the above complaint.  Symptoms x 4 days. Reports congestion and ear pain. Ear pain is bilateral. Has had negative COVID testing. Concern for infection. No relieving factors. No other complaints.   Past Medical History:  Diagnosis Date   Anemia    Anxiety    Arthritis    Bradycardia    Cancer ([place]) 1990   cervical cells stage 4   Fibromyalgia    History of hiatal hernia    SMALL   History of kidney stones    H/O   Hypertension    Neuropathy     Patient Active Problem List   Diagnosis Date Noted   Primary osteoarthritis of left knee 03/08/2021   Fibromyalgia 08/18/2020   Chronic pain of left knee 08/18/2020   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   Psoriatic arthritis ([place]) 08/18/2020   RLQ abdominal pain 12/30/2018   Leukocytosis 12/30/2018   Anxiety and depression 10/24/2018   Arthritis 10/24/2018   Chronic, continuous use of opioids 10/24/2018   Chronic insomnia 10/24/2018   Hyperlipidemia 10/24/2018   Hypertension 10/24/2018   Neuropathy 10/24/2018   Obesity (BMI 30-39.9) 10/24/2018   H/O excision of epidermal inclusion cyst 10/24/2018   SIRS (systemic inflammatory response syndrome) ([place]) 10/14/2018   Epidermal inclusion cyst    Mass of soft tissue of abdomen 09/30/2018   B12 deficiency 11/20/2016   Vitamin D insufficiency 11/20/2016   Hx of normocytic normochromic anemia 12/20/2015   Incomplete tear of right rotator cuff 11/21/2015    Past Surgical History:  Procedure Laterality Date   ABDOMINAL HYSTERECTOMY     APPENDECTOMY     BREAST CYST ASPIRATION Left    neg   CHOLECYSTECTOMY     COLONOSCOPY  2018   cyst removed     thyroglossal duct cyst   JOINT REPLACEMENT     LIPOMA EXCISION Right 10/09/2018   NOT A LIPOMA.  EPIDERMAL  INCLUSION CYST   REPLACEMENT TOTAL KNEE     Right knee    OB History   No obstetric history on file.      Home Medications    Prior to Admission medications   Medication Sig Start Date End Date Taking? Authorizing Provider  amoxicillin-clavulanate (AUGMENTIN) 875-125 MG tablet Take 1 tablet by mouth 2 (two) times daily. 05/20/21  Yes Ayo Guarino G, DO  cetirizine (ZYRTEC) 10 MG tablet Take 10 mg by mouth daily as needed for allergies.   Yes [provider]  DULoxetine (CYMBALTA) 30 MG capsule Take 1 capsule (30 mg total) by mouth daily. 03/08/21 06/06/21 Yes Gillis Santa, MD  furosemide (LASIX) 20 MG tablet Take 20 mg by mouth daily as needed. 10/28/20  Yes [provider]  gabapentin (NEURONTIN) 300 MG capsule Take 1 capsule (300 mg total) by mouth 2 (two) times daily. 03/08/21  Yes Gillis Santa, MD  HUMIRA PEN 40 MG/0.4ML PNKT SMARTSIG:40 Milligram(s) SUB-Q Every 2 Weeks 05/12/20  Yes [provider]  ibuprofen (ADVIL) 800 MG tablet Take 800 mg by mouth 2 (two) times daily as needed.   Yes [provider]  lisinopril-hydrochlorothiazide (PRINZIDE,ZESTORETIC) 20-25 MG tablet Take 1 tablet by mouth every morning.   Yes [provider]  naproxen sodium (ALEVE) 220 MG tablet Take 220 mg by  mouth 2 (two) times daily with a meal.   Yes [provider]  omeprazole (PRILOSEC) 40 MG capsule Take 40 mg by mouth daily. 07/23/20  Yes [provider]  rosuvastatin (CRESTOR) 5 MG tablet Take 5 mg by mouth. 3 times per week 04/07/20  Yes [provider]  traMADol (ULTRAM) 50 MG tablet Take 2 tablets (100 mg total) by mouth 2 (two) times daily. 03/08/21 06/06/21 Yes Gillis Santa, MD  triamcinolone (KENALOG) 0.025 % cream Apply 1 application topically 2 (two) times daily.   Yes [provider]    Family History Family History  Problem Relation Age of Onset   Diabetes Mother    Heart failure Mother    Asthma Mother    Cancer  Father    Breast cancer Cousin 80       mat cousin    Social History Social History   Tobacco Use   Smoking status: Every Day    Packs/day: 0.50    Years: 25.00    Pack years: 12.50    Types: Cigarettes   Smokeless tobacco: Never  Vaping Use   Vaping Use: Never used  Substance Use Topics   Alcohol use: Yes    Alcohol/week: 0.0 standard drinks    Comment: social   Drug use: No     Allergies   Onion, Coconut flavor, Demerol [meperidine], Morphine and related, Phenergan [promethazine hcl], Stadol [butorphanol], Zofran [ondansetron hcl], and Imitrex [sumatriptan]   Review of Systems Review of Systems Per HPI  Physical Exam Triage Vital Signs ED Triage Vitals  Enc Vitals Group     BP 05/20/21 1425 (!) 158/99     Pulse Rate 05/20/21 1425 71     Resp 05/20/21 1425 18     Temp 05/20/21 1425 98.6 F (37 C)     Temp Source 05/20/21 1425 Oral     SpO2 05/20/21 1425 98 %     Weight 05/20/21 1424 217 lb (98.4 kg)     Height 05/20/21 1424 5\' 6"  (1.676 m)     Head Circumference --      Peak Flow --      Pain Score 05/20/21 1424 5     Pain Loc --      Pain Edu? --      Excl. in [place]? --    Updated Vital Signs BP (!) 158/99 (BP Location: Right Arm)   Pulse 71   Temp 98.6 F (37 C) (Oral)   Resp 18   Ht 5\' 6"  (1.676 m)   Wt 98.4 kg   SpO2 98%   BMI 35.02 kg/m   Visual Acuity Right Eye Distance:   Left Eye Distance:   Bilateral Distance:    Right Eye Near:   Left Eye Near:    Bilateral Near:     Physical Exam Vitals and nursing note reviewed.  Constitutional:      General: She is not in acute distress.    Appearance: Normal appearance. She is not ill-appearing.  HENT:     Head: Normocephalic and atraumatic.     Left Ear: Tympanic membrane normal.     Ears:     Comments: Right TM with erythema and bullae. Eyes:     General:        Right eye: No discharge.        Left eye: No discharge.     Conjunctiva/sclera: Conjunctivae normal.  Cardiovascular:      Rate and Rhythm: Normal rate and regular  rhythm.  Pulmonary:     Effort: Pulmonary effort is normal.     Breath sounds: Normal breath sounds. No wheezing, rhonchi or rales.  Neurological:     Mental Status: She is alert.  Psychiatric:        Mood and Affect: Mood normal.        Behavior: Behavior normal.     UC Treatments / Results  Labs (all labs ordered are listed, but only abnormal results are displayed) Labs Reviewed - No data to display  EKG   Radiology No results found.  Procedures Procedures (including critical care time)  Medications Ordered in UC Medications - No data to display  Initial Impression / Assessment and Plan / UC Course  I have reviewed the triage vital signs and the nursing notes.  Pertinent labs & imaging results that were available during my care of the patient were reviewed by me and considered in my medical decision making (see chart for details).    50 year old female presents with bullous myringitis.  Treating with Augmentin.  Final Clinical Impressions(s) / UC Diagnoses   Final diagnoses:  Bullous myringitis of right ear     Discharge Instructions      Antibiotic as prescribed.  Take care  Dr. Lacinda Axon    ED Prescriptions     Medication Sig Dispense Auth. Provider   amoxicillin-clavulanate (AUGMENTIN) 875-125 MG tablet Take 1 tablet by mouth 2 (two) times daily. 20 tablet Coral Spikes, DO      PDMP not reviewed this encounter.   Coral Spikes, [place] 05/20/21 [phone]

## 2021-05-20 NOTE — Discharge Instructions (Addendum)
Antibiotic as prescribed.  Take care  Dr. Harriett Azar  

## 2021-05-20 NOTE — ED Triage Notes (Signed)
Patient states that she has been having nasal congestion and bilateral ear pain x 4 days. States that she tested for covid and was negative.

## 2021-05-24 ENCOUNTER — Encounter: Payer: 59 | Admitting: Student in an Organized Health Care Education/Training Program

## 2021-05-26 ENCOUNTER — Encounter: Payer: 59 | Admitting: Student in an Organized Health Care Education/Training Program

## 2021-06-09 ENCOUNTER — Encounter: Payer: Self-pay | Admitting: Student in an Organized Health Care Education/Training Program

## 2021-06-09 ENCOUNTER — Other Ambulatory Visit: Payer: Self-pay

## 2021-06-09 ENCOUNTER — Ambulatory Visit
Payer: 59 | Attending: Student in an Organized Health Care Education/Training Program | Admitting: Student in an Organized Health Care Education/Training Program

## 2021-06-09 VITALS — BP 158/89 | HR 66 | Temp 96.8°F | Resp 16 | Ht 66.0 in | Wt 204.0 lb

## 2021-06-09 DIAGNOSIS — Z96651 Presence of right artificial knee joint: Secondary | ICD-10-CM | POA: Diagnosis present

## 2021-06-09 DIAGNOSIS — E538 Deficiency of other specified B group vitamins: Secondary | ICD-10-CM | POA: Diagnosis present

## 2021-06-09 DIAGNOSIS — M1712 Unilateral primary osteoarthritis, left knee: Secondary | ICD-10-CM | POA: Diagnosis present

## 2021-06-09 DIAGNOSIS — M25562 Pain in left knee: Secondary | ICD-10-CM | POA: Diagnosis not present

## 2021-06-09 DIAGNOSIS — G8929 Other chronic pain: Secondary | ICD-10-CM | POA: Diagnosis present

## 2021-06-09 DIAGNOSIS — Z0289 Encounter for other administrative examinations: Secondary | ICD-10-CM | POA: Diagnosis not present

## 2021-06-09 DIAGNOSIS — L4050 Arthropathic psoriasis, unspecified: Secondary | ICD-10-CM | POA: Diagnosis present

## 2021-06-09 DIAGNOSIS — G894 Chronic pain syndrome: Secondary | ICD-10-CM | POA: Diagnosis present

## 2021-06-09 DIAGNOSIS — M797 Fibromyalgia: Secondary | ICD-10-CM | POA: Insufficient documentation

## 2021-06-09 MED ORDER — TRAMADOL HCL 50 MG PO TABS: 50.0000 mg | ORAL_TABLET | Freq: Four times a day (QID) | ORAL | 2 refills | Status: DC | PRN

## 2021-06-09 NOTE — Progress Notes (Signed)
Nursing Pain Medication Assessment:  Safety precautions to be maintained throughout the outpatient stay will include: orient to surroundings, keep bed in low position, maintain call bell within reach at all times, provide assistance with transfer out of bed and ambulation.  Medication Inspection Compliance: Pill count conducted under aseptic conditions, in front of the patient. Neither the pills nor the bottle was removed from the patient's sight at any time. Once count was completed pills were immediately returned to the patient in their original bottle.  Medication: Tramadol (Ultram) Pill/Patch Count:  67 of 120 pills remain Pill/Patch Appearance: Markings consistent with prescribed medication Bottle Appearance: Standard pharmacy container. Clearly labeled. Filled Date: 07 / 07 / 2022 Last Medication intake:  Today

## 2021-06-09 NOTE — Progress Notes (Signed)
PROVIDER NOTE: Information contained herein reflects review and annotations entered in association with encounter. Interpretation of such information and data should be left to medically-trained personnel. Information provided to patient can be located elsewhere in the medical record under "Patient Instructions". Document created using STT-dictation technology, any transcriptional errors that may result from process are unintentional.    Patient: Amy Logan  Service Category: E/M  Provider: Gillis Santa, MD  DOB: August 24, 1971  DOS: 06/09/2021  Specialty: Interventional Pain Management  MRN: [id number]  Setting: Ambulatory outpatient  PCP: Sallee Lange, NP  Type: Established Patient    Referring Provider: Sallee Lange, *  Location: Office  Delivery: Face-to-face     HPI  Ms. Suad Autrey, a 50 y.o. year old female, is here today because of her Primary osteoarthritis of left knee [M17.12]. Ms. Gwilliam primary complain today is Joint Pain (Psoriatic psoriasis ) and Other (fibromyalgia) Last encounter: My last encounter with her was on 03/08/2021. Pertinent problems: Ms. Doerr has Incomplete tear of right rotator cuff; Neuropathy; Obesity (BMI 30-39.9); Fibromyalgia; Chronic pain of left knee; Status post total right knee replacement; and Psoriatic arthritis ([place]) on their pertinent problem list. Pain Assessment: Severity of Chronic pain is reported as a 6 /10. Location:  (generalized joint pain from arithritis and fibromyalgia) Other (Comment)/denies. Onset: More than a month ago. Quality: Discomfort, Constant, Aching, Pins and needles. Timing: Constant. Modifying factor(s): heat, warm baths, medications. Vitals:  height is $RemoveB'5\' 6"'jdDDUcRr$  (1.676 m) and weight is 204 lb (92.5 kg). Her temporal temperature is 96.8 F (36 C) (abnormal). Her blood pressure is 158/89 (abnormal) and her pulse is 66. Her respiration is 16 and oxygen saturation is 99%.   Reason for encounter: medication management.    No  change in medical history since last visit.  Patient's pain is at baseline.  Patient continues multimodal pain regimen as prescribed.  States that it provides pain relief and improvement in functional status.   Pharmacotherapy Assessment   Analgesic: Tramadol 100 mg twice daily as needed, quantity 120/month    Monitoring: [place] PMP: PDMP reviewed during this encounter.       Pharmacotherapy: No side-effects or adverse reactions reported. Compliance: No problems identified. Effectiveness: Clinically acceptable.  Janett Billow, RN  06/09/2021  9:10 AM  Sign when Signing Visit Nursing Pain Medication Assessment:  Safety precautions to be maintained throughout the outpatient stay will include: orient to surroundings, keep bed in low position, maintain call bell within reach at all times, provide assistance with transfer out of bed and ambulation.  Medication Inspection Compliance: Pill count conducted under aseptic conditions, in front of the patient. Neither the pills nor the bottle was removed from the patient's sight at any time. Once count was completed pills were immediately returned to the patient in their original bottle.  Medication: Tramadol (Ultram) Pill/Patch Count:  67 of 120 pills remain Pill/Patch Appearance: Markings consistent with prescribed medication Bottle Appearance: Standard pharmacy container. Clearly labeled. Filled Date: 07 / 07 / 2022 Last Medication intake:  Today   UDS:  Summary  Date Value Ref Range Status  08/19/2020 Note  Final    Comment:    ==================================================================== Compliance Drug Analysis, Ur ==================================================================== Test                             Result       Flag       Units  Drug Present and Declared for Prescription  Verification   Tramadol                       293          EXPECTED   ng/mg creat   O-Desmethyltramadol            1042         EXPECTED    ng/mg creat   N-Desmethyltramadol            107          EXPECTED   ng/mg creat    Source of tramadol is a prescription medication. O-desmethyltramadol    and N-desmethyltramadol are expected metabolites of tramadol.  Drug Present not Declared for Prescription Verification   Naproxen                       PRESENT      UNEXPECTED   Diphenhydramine                PRESENT      UNEXPECTED  Drug Absent but Declared for Prescription Verification   Gabapentin                     Not Detected UNEXPECTED   Duloxetine                     Not Detected UNEXPECTED   Ibuprofen                      Not Detected UNEXPECTED    Ibuprofen, as indicated in the declared medication list, is not    always detected even when used as directed.  ==================================================================== Test                      Result    Flag   Units      Ref Range   Creatinine              113              mg/dL      >=20 ==================================================================== Declared Medications:  The flagging and interpretation on this report are based on the  following declared medications.  Unexpected results may arise from  inaccuracies in the declared medications.   **Note: The testing scope of this panel includes these medications:   Duloxetine (Cymbalta)  Gabapentin  Tramadol (Ultram)   **Note: The testing scope of this panel does not include small to  moderate amounts of these reported medications:   Ibuprofen (Advil)   **Note: The testing scope of this panel does not include the  following reported medications:   Adalimumab (Humira)  Cetirizine (Zyrtec)  Folic Acid  Hydrochlorothiazide  Lisinopril  Methotrexate  Omeprazole  Rosuvastatin  Triamcinolone (Kenalog)  Vitamin B12 ==================================================================== For clinical consultation, please call [phone]. ====================================================================      ROS  Constitutional: Denies any fever or chills Gastrointestinal: No reported hemesis, hematochezia, vomiting, or acute GI distress Musculoskeletal:  Bilateral knee pain Neurological: No reported episodes of acute onset apraxia, aphasia, dysarthria, agnosia, amnesia, paralysis, loss of coordination, or loss of consciousness  Medication Review  Adalimumab, DULoxetine, amoxicillin-clavulanate, cetirizine, cyanocobalamin, furosemide, gabapentin, ibuprofen, lisinopril-hydrochlorothiazide, methotrexate, naproxen sodium, omeprazole, rosuvastatin, traMADol, and triamcinolone  History Review  Allergy: Ms. Bezio is allergic to onion, coconut flavor, demerol [meperidine], morphine and related, phenergan [promethazine hcl], stadol [butorphanol], zofran [ondansetron hcl], and imitrex [sumatriptan]. Drug:  Ms. Anwar  reports no history of drug use. Alcohol:  reports current alcohol use. Tobacco:  reports that she has been smoking cigarettes. She has a 12.50 pack-year smoking history. She has never used smokeless tobacco. Social: Ms. Rorke  reports that she has been smoking cigarettes. She has a 12.50 pack-year smoking history. She has never used smokeless tobacco. She reports current alcohol use. She reports that she does not use drugs. Medical:  has a past medical history of Anemia, Anxiety, Arthritis, Bradycardia, Cancer ([place]) (1990), Fibromyalgia, History of hiatal hernia, History of kidney stones, Hypertension, and Neuropathy. Surgical: Ms. Sexson  has a past surgical history that includes Cholecystectomy; Appendectomy; Replacement total knee; Abdominal hysterectomy; Joint replacement; Breast cyst aspiration (Left); Colonoscopy (2018); cyst removed; and Lipoma excision (Right, 10/09/2018). Family: family history includes Asthma in her mother; Breast cancer (age of onset: 73) in her cousin; Cancer in her father; Diabetes in  her mother; Heart failure in her mother.  Laboratory Chemistry Profile   Renal Lab Results  Component Value Date   BUN 8 12/31/2018   CREATININE 0.71 12/31/2018   GFRAA >60 12/31/2018   GFRNONAA >60 12/31/2018     Hepatic Lab Results  Component Value Date   AST 16 12/30/2018   ALT 14 12/30/2018   ALBUMIN 4.4 12/30/2018   ALKPHOS 46 12/30/2018   LIPASE 24 12/30/2018     Electrolytes Lab Results  Component Value Date   NA 138 12/31/2018   K 3.5 12/31/2018   CL 104 12/31/2018   CALCIUM 8.6 (L) 12/31/2018   MG 2.1 03/06/2013     Bone No results found for: VD25OH, VD125OH2TOT, IR6789FY1, OF7510CH8, 25OHVITD1, 25OHVITD2, 25OHVITD3, TESTOFREE, TESTOSTERONE   Inflammation (CRP: Acute Phase) (ESR: Chronic Phase) Lab Results  Component Value Date   LATICACIDVEN 1.5 12/30/2018       Note: Above Lab results reviewed.  Recent Imaging Review  MR Abdomen W or Wo Contrast CLINICAL DATA:  Right-sided abdominal pain.  "Abdominal infection".  EXAM: MRI ABDOMEN WITHOUT AND WITH CONTRAST  TECHNIQUE: Multiplanar multisequence MR imaging of the abdomen was performed both before and after the administration of intravenous contrast.  CONTRAST:  7.5 cc Gadavist  COMPARISON:  Today's pelvic ultrasound and abdominopelvic CTs, dictated separately.  FINDINGS: Portions of exam are minimally motion degraded.  Lower chest: Small hiatal hernia. Normal heart size without pericardial or pleural effusion.  Hepatobiliary: Hepatomegaly, 22.4 cm craniocaudal. No focal liver lesion.  Cholecystectomy. Common duct upper normal for prior cholecystectomy. Example at 10 mm in the porta hepatis. No obstructive stone or mass.  Pancreas:  Normal, without mass or ductal dilatation.  Spleen:  Normal in size, without focal abnormality.  Adrenals/Urinary Tract: Normal adrenal glands. Normal kidneys, without hydronephrosis.  Stomach/Bowel: Normal distal stomach and abdominal bowel  loops.  Vascular/Lymphatic: Normal caliber of the aorta and branch vessels. No abdominal adenopathy.  Other: Small volume right-sided abdominal fluid, including image 36/4.  Musculoskeletal: No acute osseous abnormality.  IMPRESSION: 1. Small volume abdominal fluid, as detailed on CT. 2. Otherwise, no acute process in the abdomen. 3. Hepatomegaly. 4. Cholecystectomy with borderline common duct dilatation, most likely within normal variation. No cause identified. 5. Small hiatal hernia.  Electronically Signed   By: Abigail Miyamoto M.D.   On: 12/30/2018 20:06 US PELVIS TRANSVANGINAL NON-OB (TV ONLY) CLINICAL DATA:  Right-sided pelvic pain post hysterectomy. Possible torsion.  EXAM: TRANSABDOMINAL AND TRANSVAGINAL ULTRASOUND OF PELVIS  TECHNIQUE: Both transabdominal and transvaginal ultrasound examinations of the pelvis were performed. Transabdominal  technique was performed for global imaging of the pelvis including uterus, ovaries, adnexal regions, and pelvic cul-de-sac. It was necessary to proceed with endovaginal exam following the transabdominal exam to visualize the ovaries.  COMPARISON:  Abdominopelvic CT same date and 10/14/2018  FINDINGS: Uterus  Measurements: Surgically absent.  Right ovary  Measurements: Not visualized.  No adnexal mass seen.  Left ovary  Measurements: Not visualized.  No adnexal mass seen.  Other findings  A moderate amount of free pelvic fluid is present.  IMPRESSION: 1. Neither ovary is visualized post hysterectomy. 2. Etiology for the right adnexal enlargement seen on earlier CT is not further clarified. 3. Nonspecific moderate free pelvic fluid.  Electronically Signed   By: Richardean Sale M.D.   On: 12/30/2018 17:04 US Pelvis Complete CLINICAL DATA:  Right-sided pelvic pain post hysterectomy. Possible torsion.  EXAM: TRANSABDOMINAL AND TRANSVAGINAL ULTRASOUND OF PELVIS  TECHNIQUE: Both transabdominal and transvaginal  ultrasound examinations of the pelvis were performed. Transabdominal technique was performed for global imaging of the pelvis including uterus, ovaries, adnexal regions, and pelvic cul-de-sac. It was necessary to proceed with endovaginal exam following the transabdominal exam to visualize the ovaries.  COMPARISON:  Abdominopelvic CT same date and 10/14/2018  FINDINGS: Uterus  Measurements: Surgically absent.  Right ovary  Measurements: Not visualized.  No adnexal mass seen.  Left ovary  Measurements: Not visualized.  No adnexal mass seen.  Other findings  A moderate amount of free pelvic fluid is present.  IMPRESSION: 1. Neither ovary is visualized post hysterectomy. 2. Etiology for the right adnexal enlargement seen on earlier CT is not further clarified. 3. Nonspecific moderate free pelvic fluid.  Electronically Signed   By: Richardean Sale M.D.   On: 12/30/2018 17:04 CT ABDOMEN PELVIS W CONTRAST CLINICAL DATA:  Abdominal pain.  Leukocytosis.  EXAM: CT ABDOMEN AND PELVIS WITH CONTRAST  TECHNIQUE: Multidetector CT imaging of the abdomen and pelvis was performed using the standard protocol following bolus administration of intravenous contrast.  CONTRAST:  123mL OMNIPAQUE IOHEXOL 300 MG/ML  SOLN  COMPARISON:  CT 10/14/2018  FINDINGS: Lower chest: Lung bases are clear.  Hepatobiliary: Postcholecystectomy. Mild prominence of intrahepatic bile ducts similar comparison to comparison exam. The common bile duct measures 9 mm (image 31/2 which compares to 9 mm on comparison exam.  Pancreas: No pancreatic duct dilatation. No pancreatic inflammation.  Spleen: Normal spleen  Adrenals/urinary tract: Adrenal glands and kidneys are normal. The ureters and bladder normal.  Stomach/Bowel: Stomach, small-bowel cecum normal. The cecum has been migrated into the LEFT upper quadrant (image 23/2). No evidence cecal dilatation or obstruction. Post appendectomy. There is  some fluid along the RIGHT lower quadrant which is moderately high-density just above simple fluid density. This fluid extends in the RIGHT pelvis. Ascending, transverse and descending colon normal. Rectum normal. Several diverticular the descending colon without acute inflammation  Vascular/Lymphatic: Abdominal aorta is normal caliber. No periportal or retroperitoneal adenopathy. No pelvic adenopathy.  Reproductive: Post hysterectomy. There is fluid in the RIGHT adnexal region. The RIGHT ovary is difficult to identify. On previous there are clips adjacent adjacent to the RIGHT ovary. There is ovoid soft tissue mass in this same region measuring 4.7 by 3.0 cm by 6.0 (Image 67/7, image 46/5). There is fluid adjacent to this rounded soft tissue mass which is high-density. This mass lesion is immediately associated with the small bowel loops. Fluid extends to the pelvis.  The LEFT ovary is not identified.  Post hysterectomy.  Other: No intraperitoneal free air.  Musculoskeletal: No aggressive osseous lesion.  IMPRESSION: 1. Inflammatory process in the RIGHT lower quadrant / RIGHT adnexal region. RIGHT ovary is poorly defined and difficult to separate from the small bowel but concern for enlargement of the RIGHT ovary as described above. Differential for RIGHT adnexa process would include RIGHT TUBO-OVARIAN ABSCESS AND OVARIAN TORSION TORSION. Recommend pelvic ultrasound for further evaluation. 2. Cecum has migrated into the LEFT upper quadrant. No evidence obstruction or volvulus. The migration of the bowel could potentially result in vascular compromise to the RIGHT ovary. 3. Post appendectomy and cholecystectomy. Dilatation of the common bile duct is similar to prior and favored benign post cholecystectomy dilatation.  Electronically Signed   By: Suzy Bouchard M.D.   On: 12/30/2018 15:43 Note: Reviewed        Physical Exam  General appearance: Well nourished, well  developed, and well hydrated. In no apparent acute distress Mental status: Alert, oriented x 3 (person, place, & time)       Respiratory: No evidence of acute respiratory distress Eyes: PERLA Vitals: BP (!) 158/89 (BP Location: Left Arm, Patient Position: Sitting, Cuff Size: Large)   Pulse 66   Temp (!) 96.8 F (36 C) (Temporal)   Resp 16   Ht $R'5\' 6"'ch$  (1.676 m)   Wt 204 lb (92.5 kg)   SpO2 99%   BMI 32.93 kg/m  BMI: Estimated body mass index is 32.93 kg/m as calculated from the following:   Height as of this encounter: $RemoveBeforeD'5\' 6"'NxxXWDRHHNHode$  (1.676 m).   Weight as of this encounter: 204 lb (92.5 kg). Ideal: Ideal body weight: 59.3 kg (130 lb 11.7 oz) Adjusted ideal body weight: 72.6 kg (160 lb 0.6 oz)    Lower Extremity Exam      Side: Right lower extremity   Side: Left lower extremity  Stability: No instability observed           Stability: No instability observed          Skin & Extremity Inspection: Evidence of prior arthroplastic surgery   Skin & Extremity Inspection: Skin color, temperature, and hair growth are WNL. No peripheral edema or cyanosis. No masses, redness, swelling, asymmetry, or associated skin lesions. No contractures.  Functional ROM: Unrestricted ROM                   Functional ROM: Improved after treatment                  Muscle Tone/Strength: Functionally intact. No obvious neuro-muscular anomalies detected.   Muscle Tone/Strength: Functionally intact. No obvious neuro-muscular anomalies detected.  Sensory (Neurological): Unimpaired         Sensory (Neurological): Arthropathic arthralgia, improved after treatment        DTR: Patellar: deferred today Achilles: deferred today Plantar: deferred today   DTR: Patellar: deferred today Achilles: deferred today Plantar: deferred today  Palpation: No palpable anomalies   Palpation: No palpable anomalies    Assessment   Status Diagnosis  Controlled Controlled Controlled 1. Primary osteoarthritis of left knee   2. Chronic  pain of left knee   3. Fibromyalgia   4. Status post total right knee replacement   5. Psoriatic arthritis ([place])   6. B12 deficiency   7. Pain management contract signed   8. Chronic pain syndrome       Plan of Care  Ms. Lavelle Berland has a current medication list which includes the following long-term medication(s): cetirizine, furosemide, gabapentin, humira pen, omeprazole, rosuvastatin, and duloxetine.  Pharmacotherapy (Medications Ordered): Meds ordered this encounter  Medications   traMADol (ULTRAM) 50 MG tablet    Sig: Take 1 tablet (50 mg total) by mouth every 6 (six) hours as needed for severe pain.    Dispense:  120 tablet    Refill:  2    For chronic pain syndrome   No orders of the defined types were placed in this encounter.   Follow-up plan:   Return in about 14 weeks (around 09/15/2021) for Medication Management, in person.      Recent Visits No visits were found meeting these conditions. Showing recent visits within past 90 days and meeting all other requirements Today's Visits Date Type Provider Dept  06/09/21 Office Visit Gillis Santa, MD [facility]  Showing today's visits and meeting all other requirements Future Appointments No visits were found meeting these conditions. Showing future appointments within next 90 days and meeting all other requirements I discussed the assessment and treatment plan with the patient. The patient was provided an opportunity to ask questions and all were answered. The patient agreed with the plan and demonstrated an understanding of the instructions.  Patient advised to call back or seek an in-person evaluation if the symptoms or condition worsens.  Duration of encounter: 30 minutes.  Note by: Gillis Santa, MD Date: 06/09/2021; Time: 10:22 AM

## 2021-07-06 ENCOUNTER — Telehealth: Payer: Self-pay | Admitting: Student in an Organized Health Care Education/Training Program

## 2021-07-06 NOTE — Telephone Encounter (Signed)
[facility] called about patient Duloxitine refill. Looks like it didn't get filled last visit. But also [facility] not listed as pharmacy?  Their phone is [phone] use reference # [id number]

## 2021-07-06 NOTE — Telephone Encounter (Signed)
Called pharmacy and instructed tham that the patient must call for refill request. Tech states she will let patient know.

## 2021-07-19 ENCOUNTER — Telehealth: Payer: Self-pay

## 2021-07-19 ENCOUNTER — Other Ambulatory Visit: Payer: Self-pay | Admitting: Student in an Organized Health Care Education/Training Program

## 2021-07-19 MED ORDER — DULOXETINE HCL 30 MG PO CPEP: 30.0000 mg | ORAL_CAPSULE | Freq: Every day | ORAL | 5 refills | Status: DC

## 2021-07-19 NOTE — Telephone Encounter (Signed)
Patient called. Pharmacy confirmed. Bubble to Dr. Holley Raring to refill.

## 2021-07-19 NOTE — Telephone Encounter (Signed)
Patient states she is out of cymbalta. She is not supposed to return till October.

## 2021-08-23 ENCOUNTER — Encounter: Payer: Self-pay | Admitting: General Surgery

## 2021-09-05 ENCOUNTER — Encounter: Payer: Self-pay | Admitting: Student in an Organized Health Care Education/Training Program

## 2021-09-05 ENCOUNTER — Ambulatory Visit
Payer: 59 | Attending: Student in an Organized Health Care Education/Training Program | Admitting: Student in an Organized Health Care Education/Training Program

## 2021-09-05 ENCOUNTER — Other Ambulatory Visit: Payer: Self-pay

## 2021-09-05 VITALS — BP 126/70 | HR 75 | Temp 97.1°F | Resp 16 | Ht 66.0 in | Wt 196.0 lb

## 2021-09-05 DIAGNOSIS — Z96651 Presence of right artificial knee joint: Secondary | ICD-10-CM | POA: Insufficient documentation

## 2021-09-05 DIAGNOSIS — M797 Fibromyalgia: Secondary | ICD-10-CM | POA: Diagnosis not present

## 2021-09-05 DIAGNOSIS — E538 Deficiency of other specified B group vitamins: Secondary | ICD-10-CM | POA: Diagnosis present

## 2021-09-05 DIAGNOSIS — G8929 Other chronic pain: Secondary | ICD-10-CM | POA: Insufficient documentation

## 2021-09-05 DIAGNOSIS — M25562 Pain in left knee: Secondary | ICD-10-CM | POA: Insufficient documentation

## 2021-09-05 DIAGNOSIS — M1712 Unilateral primary osteoarthritis, left knee: Secondary | ICD-10-CM | POA: Diagnosis present

## 2021-09-05 DIAGNOSIS — L4050 Arthropathic psoriasis, unspecified: Secondary | ICD-10-CM | POA: Diagnosis present

## 2021-09-05 DIAGNOSIS — G894 Chronic pain syndrome: Secondary | ICD-10-CM | POA: Insufficient documentation

## 2021-09-05 MED ORDER — GABAPENTIN 300 MG PO CAPS: 300.0000 mg | ORAL_CAPSULE | Freq: Two times a day (BID) | ORAL | 11 refills | Status: DC

## 2021-09-05 MED ORDER — TRAMADOL HCL 50 MG PO TABS: 50.0000 mg | ORAL_TABLET | Freq: Four times a day (QID) | ORAL | 3 refills | Status: DC | PRN

## 2021-09-05 NOTE — Progress Notes (Signed)
Nursing Pain Medication Assessment:  Safety precautions to be maintained throughout the outpatient stay will include: orient to surroundings, keep bed in low position, maintain call bell within reach at all times, provide assistance with transfer out of bed and ambulation.  Medication Inspection Compliance: Pill count conducted under aseptic conditions, in front of the patient. Neither the pills nor the bottle was removed from the patient's sight at any time. Once count was completed pills were immediately returned to the patient in their original bottle.  Medication: Tramadol (Ultram) Pill/Patch Count:  114 of 120 pills remain Pill/Patch Appearance: Markings consistent with prescribed medication Bottle Appearance: Standard pharmacy container. Clearly labeled. Filled Date: 17 / 13 / 2022 Last Medication intake:  Yesterday

## 2021-09-05 NOTE — Progress Notes (Signed)
PROVIDER NOTE: Information contained herein reflects review and annotations entered in association with encounter. Interpretation of such information and data should be left to medically-trained personnel. Information provided to patient can be located elsewhere in the medical record under "Patient Instructions". Document created using STT-dictation technology, any transcriptional errors that may result from process are unintentional.    Patient: Amy Logan  Service Category: E/M  Provider: Gillis Santa, MD  DOB: 1971/01/07  DOS: 09/05/2021  Specialty: Interventional Pain Management  MRN: [id number]  Setting: Ambulatory outpatient  PCP: Sallee Lange, NP  Type: Established Patient    Referring Provider: Sallee Lange, *  Location: Office  Delivery: Face-to-face     HPI  [name], a 50 y.o. year old female, is here today because of her Primary osteoarthritis of left knee [M17.12]. [name] primary complain today is Knee Pain (Left ) and Other (Fibromyalgia and psoriatic arthritis. ) Last encounter: My last encounter with her was on 06/09/2021. Pertinent problems: [name] has Incomplete tear of right rotator cuff; Neuropathy; Obesity (BMI 30-39.9); Fibromyalgia; Chronic pain of left knee; Status post total right knee replacement; and Psoriatic arthritis ([place]) on their pertinent problem list. Pain Assessment: Severity of Chronic pain is reported as a 7 /10. Location: Knee (see visit info) Left/starting to have sciatica pain. Onset: More than a month ago. Quality: Discomfort, Constant, Sharp, Shooting. Timing: Constant. Modifying factor(s):  Marland Kitchen Vitals:  height is 5' 6" (1.676 m) and weight is 196 lb (88.9 kg). Her temporal temperature is 97.1 F (36.2 C) (abnormal). Her blood pressure is 126/70 and her pulse is 75. Her respiration is 16 and oxygen saturation is 99%.   Reason for encounter: medication management.    No change in medical history since last visit.  Patient's pain  is at baseline.  Patient continues multimodal pain regimen as prescribed.  States that it provides pain relief and improvement in functional status. Patient states that she has been under more stress as her daughter has been struggling with SVT.  Her daughter has had multiple visits to the emergency department for syncope.  Patient continues to work as well.  Pharmacotherapy Assessment  Analgesic: Tramadol 100 mg twice daily as needed, quantity 120/month    Monitoring: [place] PMP: PDMP reviewed during this encounter.       Pharmacotherapy: No side-effects or adverse reactions reported. Compliance: No problems identified. Effectiveness: Clinically acceptable.  UDS:  Summary  Date Value Ref Range Status  08/19/2020 Note  Final    Comment:    ==================================================================== Compliance Drug Analysis, Ur ==================================================================== Test                             Result       Flag       Units  Drug Present and Declared for Prescription Verification   Tramadol                       293          EXPECTED   ng/mg creat   O-Desmethyltramadol            1042         EXPECTED   ng/mg creat   N-Desmethyltramadol            107          EXPECTED   ng/mg creat    Source of tramadol is a prescription medication.  O-desmethyltramadol    and N-desmethyltramadol are expected metabolites of tramadol.  Drug Present not Declared for Prescription Verification   Naproxen                       PRESENT      UNEXPECTED   Diphenhydramine                PRESENT      UNEXPECTED  Drug Absent but Declared for Prescription Verification   Gabapentin                     Not Detected UNEXPECTED   Duloxetine                     Not Detected UNEXPECTED   Ibuprofen                      Not Detected UNEXPECTED    Ibuprofen, as indicated in the declared medication list, is not    always detected even when used as  directed.  ==================================================================== Test                      Result    Flag   Units      Ref Range   Creatinine              113              mg/dL      >=20 ==================================================================== Declared Medications:  The flagging and interpretation on this report are based on the  following declared medications.  Unexpected results may arise from  inaccuracies in the declared medications.   **Note: The testing scope of this panel includes these medications:   Duloxetine (Cymbalta)  Gabapentin  Tramadol (Ultram)   **Note: The testing scope of this panel does not include small to  moderate amounts of these reported medications:   Ibuprofen (Advil)   **Note: The testing scope of this panel does not include the  following reported medications:   Adalimumab (Humira)  Cetirizine (Zyrtec)  Folic Acid  Hydrochlorothiazide  Lisinopril  Methotrexate  Omeprazole  Rosuvastatin  Triamcinolone (Kenalog)  Vitamin B12 ==================================================================== For clinical consultation, please call [phone]. ====================================================================       ROS  Constitutional: Denies any fever or chills Gastrointestinal: No reported hemesis, hematochezia, vomiting, or acute GI distress Musculoskeletal:  Bilateral knee pain Neurological: No reported episodes of acute onset apraxia, aphasia, dysarthria, agnosia, amnesia, paralysis, loss of coordination, or loss of consciousness  Medication Review  Adalimumab, DULoxetine, amoxicillin-clavulanate, cetirizine, cyanocobalamin, furosemide, gabapentin, ibuprofen, lisinopril-hydrochlorothiazide, methotrexate, naproxen sodium, omeprazole, rosuvastatin, traMADol, and triamcinolone  History Review  Allergy: [name] is allergic to onion, coconut flavor, demerol [meperidine], morphine and related, phenergan  [promethazine hcl], stadol [butorphanol], zofran [ondansetron hcl], and imitrex [sumatriptan]. Drug: [name]  reports no history of drug use. Alcohol:  reports current alcohol use. Tobacco:  reports that she has been smoking cigarettes. She has a 12.50 pack-year smoking history. She has never used smokeless tobacco. Social: [name]  reports that she has been smoking cigarettes. She has a 12.50 pack-year smoking history. She has never used smokeless tobacco. She reports current alcohol use. She reports that she does not use drugs. Medical:  has a past medical history of Anemia, Anxiety, Arthritis, Bradycardia, Cancer ([place]) (1990), Fibromyalgia, History of hiatal hernia, History of kidney stones, Hypertension, and Neuropathy. Surgical: [name]  has a  past surgical history that includes Cholecystectomy; Appendectomy; Replacement total knee; Abdominal hysterectomy; Joint replacement; Breast cyst aspiration (Left); Colonoscopy (2018); cyst removed; and Lipoma excision (Right, 10/09/2018). Family: family history includes Asthma in her mother; Breast cancer (age of onset: 64) in her cousin; Cancer in her father; Diabetes in her mother; Heart failure in her mother.  Laboratory Chemistry Profile   Renal Lab Results  Component Value Date   BUN 8 12/31/2018   CREATININE 0.71 12/31/2018   GFRAA >60 12/31/2018   GFRNONAA >60 12/31/2018     Hepatic Lab Results  Component Value Date   AST 16 12/30/2018   ALT 14 12/30/2018   ALBUMIN 4.4 12/30/2018   ALKPHOS 46 12/30/2018   LIPASE 24 12/30/2018     Electrolytes Lab Results  Component Value Date   NA 138 12/31/2018   K 3.5 12/31/2018   CL 104 12/31/2018   CALCIUM 8.6 (L) 12/31/2018   MG 2.1 03/06/2013     Bone No results found for: VD25OH, VD125OH2TOT, VD3125OH2, VD2125OH2, 25OHVITD1, 25OHVITD2, 25OHVITD3, TESTOFREE, TESTOSTERONE   Inflammation (CRP: Acute Phase) (ESR: Chronic Phase) Lab Results  Component Value Date   LATICACIDVEN  1.5 12/30/2018       Note: Above Lab results reviewed.  Recent Imaging Review  MR Abdomen W or Wo Contrast CLINICAL DATA:  Right-sided abdominal pain.  "Abdominal infection".  EXAM: MRI ABDOMEN WITHOUT AND WITH CONTRAST  TECHNIQUE: Multiplanar multisequence MR imaging of the abdomen was performed both before and after the administration of intravenous contrast.  CONTRAST:  7.5 cc Gadavist  COMPARISON:  Today's pelvic ultrasound and abdominopelvic CTs, dictated separately.  FINDINGS: Portions of exam are minimally motion degraded.  Lower chest: Small hiatal hernia. Normal heart size without pericardial or pleural effusion.  Hepatobiliary: Hepatomegaly, 22.4 cm craniocaudal. No focal liver lesion.  Cholecystectomy. Common duct upper normal for prior cholecystectomy. Example at 10 mm in the porta hepatis. No obstructive stone or mass.  Pancreas:  Normal, without mass or ductal dilatation.  Spleen:  Normal in size, without focal abnormality.  Adrenals/Urinary Tract: Normal adrenal glands. Normal kidneys, without hydronephrosis.  Stomach/Bowel: Normal distal stomach and abdominal bowel loops.  Vascular/Lymphatic: Normal caliber of the aorta and branch vessels. No abdominal adenopathy.  Other: Small volume right-sided abdominal fluid, including image 36/4.  Musculoskeletal: No acute osseous abnormality.  IMPRESSION: 1. Small volume abdominal fluid, as detailed on CT. 2. Otherwise, no acute process in the abdomen. 3. Hepatomegaly. 4. Cholecystectomy with borderline common duct dilatation, most likely within normal variation. No cause identified. 5. Small hiatal hernia.  Electronically Signed   By: Kyle  Talbot M.D.   On: 12/30/2018 20:06 US PELVIS TRANSVANGINAL NON-OB (TV ONLY) CLINICAL DATA:  Right-sided pelvic pain post hysterectomy. Possible torsion.  EXAM: TRANSABDOMINAL AND TRANSVAGINAL ULTRASOUND OF PELVIS  TECHNIQUE: Both transabdominal and  transvaginal ultrasound examinations of the pelvis were performed. Transabdominal technique was performed for global imaging of the pelvis including uterus, ovaries, adnexal regions, and pelvic cul-de-sac. It was necessary to proceed with endovaginal exam following the transabdominal exam to visualize the ovaries.  COMPARISON:  Abdominopelvic CT same date and 10/14/2018  FINDINGS: Uterus  Measurements: Surgically absent.  Right ovary  Measurements: Not visualized.  No adnexal mass seen.  Left ovary  Measurements: Not visualized.  No adnexal mass seen.  Other findings  A moderate amount of free pelvic fluid is present.  IMPRESSION: 1. Neither ovary is visualized post hysterectomy. 2. Etiology for the right adnexal enlargement seen on earlier CT is   not further clarified. 3. Nonspecific moderate free pelvic fluid.  Electronically Signed   By: Richardean Sale M.D.   On: 12/30/2018 17:04 US Pelvis Complete CLINICAL DATA:  Right-sided pelvic pain post hysterectomy. Possible torsion.  EXAM: TRANSABDOMINAL AND TRANSVAGINAL ULTRASOUND OF PELVIS  TECHNIQUE: Both transabdominal and transvaginal ultrasound examinations of the pelvis were performed. Transabdominal technique was performed for global imaging of the pelvis including uterus, ovaries, adnexal regions, and pelvic cul-de-sac. It was necessary to proceed with endovaginal exam following the transabdominal exam to visualize the ovaries.  COMPARISON:  Abdominopelvic CT same date and 10/14/2018  FINDINGS: Uterus  Measurements: Surgically absent.  Right ovary  Measurements: Not visualized.  No adnexal mass seen.  Left ovary  Measurements: Not visualized.  No adnexal mass seen.  Other findings  A moderate amount of free pelvic fluid is present.  IMPRESSION: 1. Neither ovary is visualized post hysterectomy. 2. Etiology for the right adnexal enlargement seen on earlier CT is not further clarified. 3.  Nonspecific moderate free pelvic fluid.  Electronically Signed   By: Richardean Sale M.D.   On: 12/30/2018 17:04 CT ABDOMEN PELVIS W CONTRAST CLINICAL DATA:  Abdominal pain.  Leukocytosis.  EXAM: CT ABDOMEN AND PELVIS WITH CONTRAST  TECHNIQUE: Multidetector CT imaging of the abdomen and pelvis was performed using the standard protocol following bolus administration of intravenous contrast.  CONTRAST:  19m OMNIPAQUE IOHEXOL 300 MG/ML  SOLN  COMPARISON:  CT 10/14/2018  FINDINGS: Lower chest: Lung bases are clear.  Hepatobiliary: Postcholecystectomy. Mild prominence of intrahepatic bile ducts similar comparison to comparison exam. The common bile duct measures 9 mm (image 31/2 which compares to 9 mm on comparison exam.  Pancreas: No pancreatic duct dilatation. No pancreatic inflammation.  Spleen: Normal spleen  Adrenals/urinary tract: Adrenal glands and kidneys are normal. The ureters and bladder normal.  Stomach/Bowel: Stomach, small-bowel cecum normal. The cecum has been migrated into the LEFT upper quadrant (image 23/2). No evidence cecal dilatation or obstruction. Post appendectomy. There is some fluid along the RIGHT lower quadrant which is moderately high-density just above simple fluid density. This fluid extends in the RIGHT pelvis. Ascending, transverse and descending colon normal. Rectum normal. Several diverticular the descending colon without acute inflammation  Vascular/Lymphatic: Abdominal aorta is normal caliber. No periportal or retroperitoneal adenopathy. No pelvic adenopathy.  Reproductive: Post hysterectomy. There is fluid in the RIGHT adnexal region. The RIGHT ovary is difficult to identify. On previous there are clips adjacent adjacent to the RIGHT ovary. There is ovoid soft tissue mass in this same region measuring 4.7 by 3.0 cm by 6.0 (Image 67/7, image 46/5). There is fluid adjacent to this rounded soft tissue mass which is high-density. This  mass lesion is immediately associated with the small bowel loops. Fluid extends to the pelvis.  The LEFT ovary is not identified.  Post hysterectomy.  Other: No intraperitoneal free air.  Musculoskeletal: No aggressive osseous lesion.  IMPRESSION: 1. Inflammatory process in the RIGHT lower quadrant / RIGHT adnexal region. RIGHT ovary is poorly defined and difficult to separate from the small bowel but concern for enlargement of the RIGHT ovary as described above. Differential for RIGHT adnexa process would include RIGHT TUBO-OVARIAN ABSCESS AND OVARIAN TORSION TORSION. Recommend pelvic ultrasound for further evaluation. 2. Cecum has migrated into the LEFT upper quadrant. No evidence obstruction or volvulus. The migration of the bowel could potentially result in vascular compromise to the RIGHT ovary. 3. Post appendectomy and cholecystectomy. Dilatation of the common bile duct is similar to  prior and favored benign post cholecystectomy dilatation.  Electronically Signed   By: Stewart  Edmunds M.D.   On: 12/30/2018 15:43 Note: Reviewed        Physical Exam  General appearance: Well nourished, well developed, and well hydrated. In no apparent acute distress Mental status: Alert, oriented x 3 (person, place, & time)       Respiratory: No evidence of acute respiratory distress Eyes: PERLA Vitals: BP 126/70 (BP Location: Right Arm, Patient Position: Sitting, Cuff Size: Large)   Pulse 75   Temp (!) 97.1 F (36.2 C) (Temporal)   Resp 16   Ht 5' 6" (1.676 m)   Wt 196 lb (88.9 kg)   SpO2 99%   BMI 31.64 kg/m  BMI: Estimated body mass index is 31.64 kg/m as calculated from the following:   Height as of this encounter: 5' 6" (1.676 m).   Weight as of this encounter: 196 lb (88.9 kg). Ideal: Ideal body weight: 59.3 kg (130 lb 11.7 oz) Adjusted ideal body weight: 71.1 kg (156 lb 13.4 oz)    Lower Extremity Exam      Side: Right lower extremity   Side: Left lower extremity   Stability: No instability observed           Stability: No instability observed          Skin & Extremity Inspection: Evidence of prior arthroplastic surgery   Skin & Extremity Inspection: Skin color, temperature, and hair growth are WNL. No peripheral edema or cyanosis. No masses, redness, swelling, asymmetry, or associated skin lesions. No contractures.  Functional ROM: Unrestricted ROM                   Functional ROM: Improved after treatment                  Muscle Tone/Strength: Functionally intact. No obvious neuro-muscular anomalies detected.   Muscle Tone/Strength: Functionally intact. No obvious neuro-muscular anomalies detected.  Sensory (Neurological): Unimpaired         Sensory (Neurological): Arthropathic arthralgia, improved after treatment        DTR: Patellar: deferred today Achilles: deferred today Plantar: deferred today   DTR: Patellar: deferred today Achilles: deferred today Plantar: deferred today  Palpation: No palpable anomalies   Palpation: No palpable anomalies    Assessment   Status Diagnosis  Controlled Controlled Controlled 1. Primary osteoarthritis of left knee   2. Chronic pain of left knee   3. Fibromyalgia   4. Status post total right knee replacement   5. Psoriatic arthritis ([facility])   6. B12 deficiency   7. Chronic pain syndrome       Plan of Care  [name] has a current medication list which includes the following long-term medication(s): cetirizine, duloxetine, furosemide, humira pen, omeprazole, rosuvastatin, and gabapentin.  Pharmacotherapy (Medications Ordered): Meds ordered this encounter  Medications   traMADol (ULTRAM) 50 MG tablet    Sig: Take 1 tablet (50 mg total) by mouth every 6 (six) hours as needed for severe pain.    Dispense:  120 tablet    Refill:  3    For chronic pain syndrome   gabapentin (NEURONTIN) 300 MG capsule    Sig: Take 1 capsule (300 mg total) by mouth 2 (two) times daily.    Dispense:  60 capsule     Refill:  11   Orders Placed This Encounter  Procedures   ToxASSURE Select 13 (MW), Urine    Volume: 30 ml(s).   Minimum 3 ml of urine is needed. Document temperature of fresh sample. Indications: Long term (current) use of opiate analgesic (Z79.891)    Order Specific Question:   Release to patient    Answer:   Immediate     Follow-up plan:   Return in about 5 months (around 01/24/2022) for Medication Management, in person.      Recent Visits Date Type Provider Dept  06/09/21 Office Visit , , MD [facility]  Showing recent visits within past 90 days and meeting all other requirements Today's Visits Date Type Provider Dept  09/05/21 Office Visit , , MD [facility]  Showing today's visits and meeting all other requirements Future Appointments No visits were found meeting these conditions. Showing future appointments within next 90 days and meeting all other requirements I discussed the assessment and treatment plan with the patient. The patient was provided an opportunity to ask questions and all were answered. The patient agreed with the plan and demonstrated an understanding of the instructions.  Patient advised to call back or seek an in-person evaluation if the symptoms or condition worsens.  Duration of encounter: 30 minutes.  Note by:  , MD Date: 09/05/2021; Time: 9:33 AM 

## 2021-09-11 LAB — TOXASSURE SELECT 13 (MW), URINE

## 2021-09-15 ENCOUNTER — Encounter: Payer: 59 | Admitting: Student in an Organized Health Care Education/Training Program

## 2022-01-03 DIAGNOSIS — M19012 Primary osteoarthritis, left shoulder: Secondary | ICD-10-CM | POA: Insufficient documentation

## 2022-01-03 DIAGNOSIS — M25512 Pain in left shoulder: Secondary | ICD-10-CM | POA: Insufficient documentation

## 2022-01-03 DIAGNOSIS — S46912A Strain of unspecified muscle, fascia and tendon at shoulder and upper arm level, left arm, initial encounter: Secondary | ICD-10-CM | POA: Insufficient documentation

## 2022-01-03 DIAGNOSIS — M778 Other enthesopathies, not elsewhere classified: Secondary | ICD-10-CM | POA: Insufficient documentation

## 2022-01-15 DIAGNOSIS — M75102 Unspecified rotator cuff tear or rupture of left shoulder, not specified as traumatic: Secondary | ICD-10-CM | POA: Insufficient documentation

## 2022-01-19 ENCOUNTER — Encounter: Payer: Self-pay | Admitting: Student in an Organized Health Care Education/Training Program

## 2022-01-19 ENCOUNTER — Ambulatory Visit
Payer: 59 | Attending: Student in an Organized Health Care Education/Training Program | Admitting: Student in an Organized Health Care Education/Training Program

## 2022-01-19 ENCOUNTER — Other Ambulatory Visit: Payer: Self-pay

## 2022-01-19 VITALS — BP 116/95 | HR 83 | Temp 97.2°F | Ht 66.0 in | Wt 196.0 lb

## 2022-01-19 DIAGNOSIS — G8929 Other chronic pain: Secondary | ICD-10-CM | POA: Insufficient documentation

## 2022-01-19 DIAGNOSIS — S46012S Strain of muscle(s) and tendon(s) of the rotator cuff of left shoulder, sequela: Secondary | ICD-10-CM | POA: Diagnosis not present

## 2022-01-19 DIAGNOSIS — Z96651 Presence of right artificial knee joint: Secondary | ICD-10-CM | POA: Diagnosis not present

## 2022-01-19 DIAGNOSIS — L4050 Arthropathic psoriasis, unspecified: Secondary | ICD-10-CM | POA: Diagnosis not present

## 2022-01-19 DIAGNOSIS — M25562 Pain in left knee: Secondary | ICD-10-CM | POA: Insufficient documentation

## 2022-01-19 DIAGNOSIS — M797 Fibromyalgia: Secondary | ICD-10-CM | POA: Insufficient documentation

## 2022-01-19 DIAGNOSIS — S46012A Strain of muscle(s) and tendon(s) of the rotator cuff of left shoulder, initial encounter: Secondary | ICD-10-CM | POA: Diagnosis not present

## 2022-01-19 DIAGNOSIS — E669 Obesity, unspecified: Secondary | ICD-10-CM | POA: Insufficient documentation

## 2022-01-19 DIAGNOSIS — M1712 Unilateral primary osteoarthritis, left knee: Secondary | ICD-10-CM | POA: Diagnosis not present

## 2022-01-19 DIAGNOSIS — F17210 Nicotine dependence, cigarettes, uncomplicated: Secondary | ICD-10-CM | POA: Insufficient documentation

## 2022-01-19 DIAGNOSIS — G894 Chronic pain syndrome: Secondary | ICD-10-CM | POA: Insufficient documentation

## 2022-01-19 DIAGNOSIS — Y9389 Activity, other specified: Secondary | ICD-10-CM | POA: Insufficient documentation

## 2022-01-19 DIAGNOSIS — X500XXA Overexertion from strenuous movement or load, initial encounter: Secondary | ICD-10-CM | POA: Insufficient documentation

## 2022-01-19 DIAGNOSIS — M75122 Complete rotator cuff tear or rupture of left shoulder, not specified as traumatic: Secondary | ICD-10-CM | POA: Insufficient documentation

## 2022-01-19 DIAGNOSIS — Z6830 Body mass index (BMI) 30.0-30.9, adult: Secondary | ICD-10-CM | POA: Insufficient documentation

## 2022-01-19 MED ORDER — DULOXETINE HCL 30 MG PO CPEP: 30.0000 mg | ORAL_CAPSULE | Freq: Every day | ORAL | 5 refills | Status: DC

## 2022-01-19 MED ORDER — TRAMADOL HCL 50 MG PO TABS: 50.0000 mg | ORAL_TABLET | Freq: Four times a day (QID) | ORAL | 3 refills | Status: DC | PRN

## 2022-01-19 NOTE — Progress Notes (Signed)
PROVIDER NOTE: Information contained herein reflects review and annotations entered in association with encounter. Interpretation of such information and data should be left to medically-trained personnel. Information provided to patient can be located elsewhere in the medical record under "Patient Instructions". Document created using STT-dictation technology, any transcriptional errors that may result from process are unintentional.    Patient: Amy Logan  Service Category: E/M  Provider: Gillis Santa, MD  DOB: 11-20-1971  DOS: 01/19/2022  Specialty: Interventional Pain Management  MRN: [id number]  Setting: Ambulatory outpatient  PCP: Sallee Lange, NP  Type: Established Patient    Referring Provider: Sallee Lange, *  Location: Office  Delivery: Face-to-face     HPI  [name], a 51 y.o. year old female, is here today because of her Traumatic complete tear of left rotator cuff, sequela [S46.012S]. [name] primary complain today is Shoulder Pain (left)  Last encounter: My last encounter with her was on 09/05/21 Pertinent problems: [name] has Incomplete tear of right rotator cuff; Neuropathy; Obesity (BMI 30-39.9); Fibromyalgia; Chronic pain of left knee; Status post total right knee replacement; and Psoriatic arthritis ([place]) on their pertinent problem list. Pain Assessment: Severity of Chronic pain is reported as a 10-Worst pain ever/10. Location: Shoulder Left/pain radiaties down her left shoulder. Onset: More than a month ago. Quality: Aching, Constant, Radiating, Throbbing, Spasm, Burning. Timing:  . Modifying factor(s): Meds and heat. Vitals:  height is $RemoveB'5\' 6"'MCbmvbIK$  (1.676 m) and weight is 196 lb (88.9 kg). Her temperature is 97.2 F (36.2 C) (abnormal). Her blood pressure is 116/95 (abnormal) and her pulse is 83. Her oxygen saturation is 100%.   Reason for encounter: medication management.    Swara presents today for medication management.  Unfortunately she was helping  her husband with some car work and she went to help him lift and she had when she tore her left rotator cuff.  She already had prior right shoulder issues.  She has been evaluated by [facility] and has been told that she needs to have shoulder surgery as well as biceps tenodesis.  She informed me that she will be prescribed hydrocodone after surgery which is fine.  I instructed her to continue her tramadol as prescribed.  I will refill her Cymbalta as well.  She continues gabapentin as prescribed.  She has lost weight by dieting and also reducing soda intake.  She states that she is feeling better from losing weight.  Pharmacotherapy Assessment  Analgesic: Tramadol 100 mg twice daily as needed, quantity 120/month    Monitoring: [place] PMP: PDMP reviewed during this encounter.       Pharmacotherapy: No side-effects or adverse reactions reported. Compliance: No problems identified. Effectiveness: Clinically acceptable.  UDS:  Summary  Date Value Ref Range Status  09/05/2021 Note  Final    Comment:    ==================================================================== [employer] Select 13 (MW) ==================================================================== Test                             Result       Flag       Units  Drug Present and Declared for Prescription Verification   Tramadol                       4966         EXPECTED   ng/mg creat   O-Desmethyltramadol            2340  EXPECTED   ng/mg creat   N-Desmethyltramadol            395          EXPECTED   ng/mg creat    Source of tramadol is a prescription medication. O-desmethyltramadol    and N-desmethyltramadol are expected metabolites of tramadol.  ==================================================================== Test                      Result    Flag   Units      Ref Range   Creatinine              93               mg/dL      >=20 ==================================================================== Declared  Medications:  The flagging and interpretation on this report are based on the  following declared medications.  Unexpected results may arise from  inaccuracies in the declared medications.   **Note: The testing scope of this panel includes these medications:   Tramadol (Ultram)   **Note: The testing scope of this panel does not include the  following reported medications:   Adalimumab (Humira)  Amoxicillin (Augmentin)  Cetirizine (Zyrtec)  Clavulanate (Augmentin)  Duloxetine (Cymbalta)  Furosemide (Lasix)  Gabapentin (Neurontin)  Hydrochlorothiazide (Zestoretic)  Ibuprofen (Advil)  Lisinopril (Zestoretic)  Methotrexate  Naproxen (Aleve)  Omeprazole (Prilosec)  Rosuvastatin (Crestor)  Triamcinolone (Kenalog)  Vitamin B12 ==================================================================== For clinical consultation, please call [phone]. ====================================================================       ROS  Constitutional: Denies any fever or chills Gastrointestinal: No reported hemesis, hematochezia, vomiting, or acute GI distress Musculoskeletal: left shoulder pain. Neurological: No reported episodes of acute onset apraxia, aphasia, dysarthria, agnosia, amnesia, paralysis, loss of coordination, or loss of consciousness  Medication Review  Adalimumab, DULoxetine, cetirizine, furosemide, gabapentin, ibuprofen, lisinopril-hydrochlorothiazide, methotrexate, naproxen sodium, omeprazole, rosuvastatin, traMADol, and triamcinolone  History Review  Allergy: [name] is allergic to onion, coconut flavor, demerol [meperidine], morphine and related, phenergan [promethazine hcl], stadol [butorphanol], zofran [ondansetron hcl], and imitrex [sumatriptan]. Drug: [name]  reports no history of drug use. Alcohol:  reports current alcohol use. Tobacco:  reports that she has been smoking cigarettes. She has a 12.50 pack-year smoking history. She has never used smokeless  tobacco. Social: [name]  reports that she has been smoking cigarettes. She has a 12.50 pack-year smoking history. She has never used smokeless tobacco. She reports current alcohol use. She reports that she does not use drugs. Medical:  has a past medical history of Anemia, Anxiety, Arthritis, Bradycardia, Cancer ([place]) (1990), Fibromyalgia, History of hiatal hernia, History of kidney stones, Hypertension, and Neuropathy. Surgical: [name]  has a past surgical history that includes Cholecystectomy; Appendectomy; Replacement total knee; Abdominal hysterectomy; Joint replacement; Breast cyst aspiration (Left); Colonoscopy (2018); cyst removed; and Lipoma excision (Right, 10/09/2018). Family: family history includes Asthma in her mother; Breast cancer (age of onset: 54) in her cousin; Cancer in her father; Diabetes in her mother; Heart failure in her mother.  Laboratory Chemistry Profile   Renal Lab Results  Component Value Date   BUN 8 12/31/2018   CREATININE 0.71 12/31/2018   GFRAA >60 12/31/2018   GFRNONAA >60 12/31/2018     Hepatic Lab Results  Component Value Date   AST 16 12/30/2018   ALT 14 12/30/2018   ALBUMIN 4.4 12/30/2018   ALKPHOS 46 12/30/2018   LIPASE 24 12/30/2018     Electrolytes Lab Results  Component Value Date   NA 138  12/31/2018   K 3.5 12/31/2018   CL 104 12/31/2018   CALCIUM 8.6 (L) 12/31/2018   MG 2.1 03/06/2013     Bone No results found for: VD25OH, BO175ZW2HEN, [id number], TI1443XV4, 25OHVITD1, 25OHVITD2, 25OHVITD3, TESTOFREE, TESTOSTERONE   Inflammation (CRP: Acute Phase) (ESR: Chronic Phase) Lab Results  Component Value Date   LATICACIDVEN 1.5 12/30/2018       Note: Above Lab results reviewed.  Recent Imaging Review  MR Abdomen W or Wo Contrast CLINICAL DATA:  Right-sided abdominal pain.  "Abdominal infection".  EXAM: MRI ABDOMEN WITHOUT AND WITH CONTRAST  TECHNIQUE: Multiplanar multisequence MR imaging of the abdomen was performed both  before and after the administration of intravenous contrast.  CONTRAST:  7.5 cc Gadavist  COMPARISON:  Today's pelvic ultrasound and abdominopelvic CTs, dictated separately.  FINDINGS: Portions of exam are minimally motion degraded.  Lower chest: Small hiatal hernia. Normal heart size without pericardial or pleural effusion.  Hepatobiliary: Hepatomegaly, 22.4 cm craniocaudal. No focal liver lesion.  Cholecystectomy. Common duct upper normal for prior cholecystectomy. Example at 10 mm in the porta hepatis. No obstructive stone or mass.  Pancreas:  Normal, without mass or ductal dilatation.  Spleen:  Normal in size, without focal abnormality.  Adrenals/Urinary Tract: Normal adrenal glands. Normal kidneys, without hydronephrosis.  Stomach/Bowel: Normal distal stomach and abdominal bowel loops.  Vascular/Lymphatic: Normal caliber of the aorta and branch vessels. No abdominal adenopathy.  Other: Small volume right-sided abdominal fluid, including image 36/4.  Musculoskeletal: No acute osseous abnormality.  IMPRESSION: 1. Small volume abdominal fluid, as detailed on CT. 2. Otherwise, no acute process in the abdomen. 3. Hepatomegaly. 4. Cholecystectomy with borderline common duct dilatation, most likely within normal variation. No cause identified. 5. Small hiatal hernia.  Electronically Signed   By: Abigail Miyamoto M.D.   On: 12/30/2018 20:06 US PELVIS TRANSVANGINAL NON-OB (TV ONLY) CLINICAL DATA:  Right-sided pelvic pain post hysterectomy. Possible torsion.  EXAM: TRANSABDOMINAL AND TRANSVAGINAL ULTRASOUND OF PELVIS  TECHNIQUE: Both transabdominal and transvaginal ultrasound examinations of the pelvis were performed. Transabdominal technique was performed for global imaging of the pelvis including uterus, ovaries, adnexal regions, and pelvic cul-de-sac. It was necessary to proceed with endovaginal exam following the transabdominal exam to visualize  the ovaries.  COMPARISON:  Abdominopelvic CT same date and 10/14/2018  FINDINGS: Uterus  Measurements: Surgically absent.  Right ovary  Measurements: Not visualized.  No adnexal mass seen.  Left ovary  Measurements: Not visualized.  No adnexal mass seen.  Other findings  A moderate amount of free pelvic fluid is present.  IMPRESSION: 1. Neither ovary is visualized post hysterectomy. 2. Etiology for the right adnexal enlargement seen on earlier CT is not further clarified. 3. Nonspecific moderate free pelvic fluid.  Electronically Signed   By: Richardean Sale M.D.   On: 12/30/2018 17:04 US Pelvis Complete CLINICAL DATA:  Right-sided pelvic pain post hysterectomy. Possible torsion.  EXAM: TRANSABDOMINAL AND TRANSVAGINAL ULTRASOUND OF PELVIS  TECHNIQUE: Both transabdominal and transvaginal ultrasound examinations of the pelvis were performed. Transabdominal technique was performed for global imaging of the pelvis including uterus, ovaries, adnexal regions, and pelvic cul-de-sac. It was necessary to proceed with endovaginal exam following the transabdominal exam to visualize the ovaries.  COMPARISON:  Abdominopelvic CT same date and 10/14/2018  FINDINGS: Uterus  Measurements: Surgically absent.  Right ovary  Measurements: Not visualized.  No adnexal mass seen.  Left ovary  Measurements: Not visualized.  No adnexal mass seen.  Other findings  A moderate amount of free  pelvic fluid is present.  IMPRESSION: 1. Neither ovary is visualized post hysterectomy. 2. Etiology for the right adnexal enlargement seen on earlier CT is not further clarified. 3. Nonspecific moderate free pelvic fluid.  Electronically Signed   By: Richardean Sale M.D.   On: 12/30/2018 17:04 CT ABDOMEN PELVIS W CONTRAST CLINICAL DATA:  Abdominal pain.  Leukocytosis.  EXAM: CT ABDOMEN AND PELVIS WITH CONTRAST  TECHNIQUE: Multidetector CT imaging of the abdomen and pelvis was  performed using the standard protocol following bolus administration of intravenous contrast.  CONTRAST:  144mL OMNIPAQUE IOHEXOL 300 MG/ML  SOLN  COMPARISON:  CT 10/14/2018  FINDINGS: Lower chest: Lung bases are clear.  Hepatobiliary: Postcholecystectomy. Mild prominence of intrahepatic bile ducts similar comparison to comparison exam. The common bile duct measures 9 mm (image 31/2 which compares to 9 mm on comparison exam.  Pancreas: No pancreatic duct dilatation. No pancreatic inflammation.  Spleen: Normal spleen  Adrenals/urinary tract: Adrenal glands and kidneys are normal. The ureters and bladder normal.  Stomach/Bowel: Stomach, small-bowel cecum normal. The cecum has been migrated into the LEFT upper quadrant (image 23/2). No evidence cecal dilatation or obstruction. Post appendectomy. There is some fluid along the RIGHT lower quadrant which is moderately high-density just above simple fluid density. This fluid extends in the RIGHT pelvis. Ascending, transverse and descending colon normal. Rectum normal. Several diverticular the descending colon without acute inflammation  Vascular/Lymphatic: Abdominal aorta is normal caliber. No periportal or retroperitoneal adenopathy. No pelvic adenopathy.  Reproductive: Post hysterectomy. There is fluid in the RIGHT adnexal region. The RIGHT ovary is difficult to identify. On previous there are clips adjacent adjacent to the RIGHT ovary. There is ovoid soft tissue mass in this same region measuring 4.7 by 3.0 cm by 6.0 (Image 67/7, image 46/5). There is fluid adjacent to this rounded soft tissue mass which is high-density. This mass lesion is immediately associated with the small bowel loops. Fluid extends to the pelvis.  The LEFT ovary is not identified.  Post hysterectomy.  Other: No intraperitoneal free air.  Musculoskeletal: No aggressive osseous lesion.  IMPRESSION: 1. Inflammatory process in the RIGHT lower quadrant  / RIGHT adnexal region. RIGHT ovary is poorly defined and difficult to separate from the small bowel but concern for enlargement of the RIGHT ovary as described above. Differential for RIGHT adnexa process would include RIGHT TUBO-OVARIAN ABSCESS AND OVARIAN TORSION TORSION. Recommend pelvic ultrasound for further evaluation. 2. Cecum has migrated into the LEFT upper quadrant. No evidence obstruction or volvulus. The migration of the bowel could potentially result in vascular compromise to the RIGHT ovary. 3. Post appendectomy and cholecystectomy. Dilatation of the common bile duct is similar to prior and favored benign post cholecystectomy dilatation.  Electronically Signed   By: Suzy Bouchard M.D.   On: 12/30/2018 15:43  Note: Reviewed        Physical Exam  General appearance: Well nourished, well developed, and well hydrated. In no apparent acute distress Mental status: Alert, oriented x 3 (person, place, & time)       Respiratory: No evidence of acute respiratory distress Eyes: PERLA Vitals: BP (!) 116/95    Pulse 83    Temp (!) 97.2 F (36.2 C)    Ht $R'5\' 6"'pa$  (1.676 m)    Wt 196 lb (88.9 kg)    SpO2 100%    BMI 31.64 kg/m  BMI: Estimated body mass index is 31.64 kg/m as calculated from the following:   Height as of this encounter: 5'  6" (1.676 m).   Weight as of this encounter: 196 lb (88.9 kg). Ideal: Ideal body weight: 59.3 kg (130 lb 11.7 oz) Adjusted ideal body weight: 71.1 kg (156 lb 13.4 oz)  Upper Extremity (UE) Exam    Side: Right upper extremity  Side: Left upper extremity  Skin & Extremity Inspection: Skin color, temperature, and hair growth are WNL. No peripheral edema or cyanosis. No masses, redness, swelling, asymmetry, or associated skin lesions. No contractures.  Skin & Extremity Inspection: Skin color, temperature, and hair growth are WNL. No peripheral edema or cyanosis. No masses, redness, swelling, asymmetry, or associated skin lesions. No contractures.   Functional ROM: Unrestricted ROM          Functional ROM: Pain restricted ROM for shoulder  Muscle Tone/Strength: Functionally intact. No obvious neuro-muscular anomalies detected.  Muscle Tone/Strength: Functionally intact. No obvious neuro-muscular anomalies detected.  Sensory (Neurological): Unimpaired          Sensory (Neurological): Musculoskeletal pain pattern          Palpation: No palpable anomalies              Palpation: No palpable anomalies              Provocative Test(s):  Phalen's test: deferred Tinel's test: deferred Apley's scratch test (touch opposite shoulder):  Action 1 (Across chest): deferred Action 2 (Overhead): deferred Action 3 (LB reach): deferred   Provocative Test(s):  Phalen's test: deferred Tinel's test: deferred Apley's scratch test (touch opposite shoulder):  Action 1 (Across chest): Decreased ROM Action 2 (Overhead): Decreased ROM Action 3 (LB reach): Decreased ROM     Assessment   Status Diagnosis  Persistent Controlled Controlled 1. Traumatic complete tear of left rotator cuff, sequela   2. Primary osteoarthritis of left knee   3. Chronic pain of left knee   4. Status post total right knee replacement   5. Psoriatic arthritis ([place])   6. Chronic pain syndrome       Plan of Care  [name] has a current medication list which includes the following long-term medication(s): cetirizine, furosemide, gabapentin, omeprazole, rosuvastatin, duloxetine, and humira pen.  Pharmacotherapy (Medications Ordered): Meds ordered this encounter  Medications   traMADol (ULTRAM) 50 MG tablet    Sig: Take 1 tablet (50 mg total) by mouth every 6 (six) hours as needed for severe pain.    Dispense:  120 tablet    Refill:  3    For chronic pain syndrome   DULoxetine (CYMBALTA) 30 MG capsule    Sig: Take 1 capsule (30 mg total) by mouth daily.    Dispense:  30 capsule    Refill:  5   Continue gabapentin as prescribed. Discussed opioid sparing  techniques to help manage postoperative pain after her shoulder surgery   Follow-up plan:   Return in about 4 months (around 05/21/2022) for Medication Management, in person.      Recent Visits No visits were found meeting these conditions. Showing recent visits within past 90 days and meeting all other requirements Today's Visits Date Type Provider Dept  01/19/22 Office Visit Gillis Santa, MD [facility]  Showing today's visits and meeting all other requirements Future Appointments No visits were found meeting these conditions. Showing future appointments within next 90 days and meeting all other requirements  I discussed the assessment and treatment plan with the patient. The patient was provided an opportunity to ask questions and all were answered. The patient agreed with the plan  and demonstrated an understanding of the instructions.  Patient advised to call back or seek an in-person evaluation if the symptoms or condition worsens.  Duration of encounter: 30 minutes.  Note by: Gillis Santa, MD Date: 01/19/2022; Time: 8:58 AM

## 2022-01-19 NOTE — Progress Notes (Signed)
Nursing Pain Medication Assessment:  ?Safety precautions to be maintained throughout the outpatient stay will include: orient to surroundings, keep bed in low position, maintain call bell within reach at all times, provide assistance with transfer out of bed and ambulation.  ?Medication Inspection Compliance: Ms. Lozano did not comply with our request to bring her pills to be counted. She was reminded that bringing the medication bottles, even when empty, is a requirement. ? ?Medication: None brought in. ?Pill/Patch Count: None available to be counted. ?Bottle Appearance: No container available. Did not bring bottle(s) to appointment. ?Filled Date: N/A ?Last Medication intake:  TodaySafety precautions to be maintained throughout the outpatient stay will include: orient to surroundings, keep bed in low position, maintain call bell within reach at all times, provide assistance with transfer out of bed and ambulation.  ?

## 2022-02-01 DIAGNOSIS — M25612 Stiffness of left shoulder, not elsewhere classified: Secondary | ICD-10-CM | POA: Insufficient documentation

## 2022-03-22 ENCOUNTER — Ambulatory Visit
Admission: EM | Admit: 2022-03-22 | Discharge: 2022-03-22 | Disposition: A | Discharge status: Home / Self Care | Payer: 59 | Attending: Emergency Medicine | Admitting: Emergency Medicine

## 2022-03-22 ENCOUNTER — Ambulatory Visit (INDEPENDENT_AMBULATORY_CARE_PROVIDER_SITE_OTHER): Payer: 59

## 2022-03-22 DIAGNOSIS — M79671 Pain in right foot: Secondary | ICD-10-CM

## 2022-03-22 NOTE — Discharge Instructions (Addendum)
Ice, elevation, CAM Walker.  I suspect you either have a stress fracture or tendinopathy at the insertion of the posterior tibial tendon.  Continue your medications as directed.  May take 1000 mg Tylenol 3-4 times a day as needed for pain.  Please follow-up with [facility] as needed. ?

## 2022-03-22 NOTE — ED Provider Notes (Signed)
HPI ? ?SUBJECTIVE: ? ?Amy Logan is a 51 y.o. female who presents with medial right foot pain starting this morning.  States that she woke up with it.  She describes it as stabbing and sore.  No change in her physical activity, she does not recall any trauma to the area, no bruising, erythema, swelling.  Her skin is intact in the area.  She tried Aspercreme with lidocaine without improvement in her symptoms.  Symptoms worse with weightbearing. Past medical history of fibromyalgia, hypertension, peripheral neuropathy worse on the left foot, psoriatic arthritis, no longer on Humira or NSAIDs.  She is recovering from shoulder surgery.  She takes tramadol 50 mg as needed, Neurontin as needed and Tylenol for pain. ? ?Past Medical History:  ?Diagnosis Date  ? Anemia   ? Anxiety   ? Arthritis   ? Bradycardia   ? 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 Fibromyalgia   ? History of hiatal hernia   ? SMALL  ? History of kidney stones   ? H/O  ? Hypertension   ? Neuropathy   ? ? ?Past Surgical History:  ?Procedure Laterality Date  ? ABDOMINAL HYSTERECTOMY    ? APPENDECTOMY    ? BREAST CYST ASPIRATION Left   ? neg  ? CHOLECYSTECTOMY    ? COLONOSCOPY  2018  ? cyst removed    ? thyroglossal duct cyst  ? JOINT REPLACEMENT    ? LIPOMA EXCISION Right 10/09/2018  ? NOT A LIPOMA.  EPIDERMAL INCLUSION CYST  ? REPLACEMENT TOTAL KNEE    ? Right knee  ? ? ?Family History  ?Problem Relation Age of Onset  ? Diabetes Mother   ? Heart failure Mother   ? Asthma Mother   ? Cancer Father   ? Breast cancer Cousin 29  ?     mat cousin  ? ? ?Social History  ? ?Tobacco Use  ? Smoking status: Every Day  ?  Packs/day: 0.50  ?  Years: 25.00  ?  Pack years: 12.50  ?  Types: Cigarettes  ? Smokeless tobacco: Never  ?Vaping Use  ? Vaping Use: Never used  ?Substance Use Topics  ? Alcohol use: Yes  ?  Alcohol/week: 0.0 standard drinks  ?  Comment: social  ? Drug use: No  ? ? ?No current facility-administered medications for this encounter. ? ?Current  Outpatient Medications:  ?  cetirizine (ZYRTEC) 10 MG tablet, Take 10 mg by mouth daily as needed for allergies., Disp: , Rfl:  ?  gabapentin (NEURONTIN) 300 MG capsule, Take 1 capsule (300 mg total) by mouth 2 (two) times daily., Disp: 60 capsule, Rfl: 11 ?  lisinopril-hydrochlorothiazide (PRINZIDE,ZESTORETIC) 20-25 MG tablet, Take 1 tablet by mouth every morning., Disp: , Rfl:  ?  omeprazole (PRILOSEC) 40 MG capsule, Take 1 capsule by mouth daily., Disp: , Rfl:  ?  traMADol (ULTRAM) 50 MG tablet, Take 1 tablet (50 mg total) by mouth every 6 (six) hours as needed for severe pain., Disp: 120 tablet, Rfl: 3 ?  DULoxetine (CYMBALTA) 30 MG capsule, Take 1 capsule (30 mg total) by mouth daily., Disp: 30 capsule, Rfl: 5 ?  fluconazole (DIFLUCAN) 150 MG tablet, Take by mouth., Disp: , Rfl:  ?  furosemide (LASIX) 20 MG tablet, Take 20 mg by mouth daily as needed., Disp: , Rfl:  ?  HYDROcodone-acetaminophen (NORCO/VICODIN) 5-325 MG tablet, hydrocodone 5 mg-acetaminophen 325 mg tablet  Take 1 tablet every 4-6 hours by oral route., Disp: ,  Rfl:  ?  methotrexate 2.5 MG tablet, Take 20 mg by mouth once a week., Disp: , Rfl:  ?  rosuvastatin (CRESTOR) 5 MG tablet, Take 5 mg by mouth. 3 times per week, Disp: , Rfl:  ?  scopolamine (TRANSDERM-SCOP) 1 MG/3DAYS, SMARTSIG:Patch(s) Topical As Directed, Disp: , Rfl:  ?  triamcinolone (KENALOG) 0.025 % cream, Apply 1 application topically 2 (two) times daily., Disp: , Rfl:  ? ?Allergies  ?Allergen Reactions  ? Meperidine Hcl Hives  ?  Other reaction(s): Confusion  ? Morphine Hives  ?  Other reaction(s): Hallucinations  ? Ondansetron Hives  ? Onion Anaphylaxis  ? Sumatriptan Palpitations  ? Butorphanol Hives  ?  Increases BP ?Other reaction(s): Irregular Heart Rate  ? Promethazine Itching and Palpitations  ?  Other reaction(s): Dizziness  ? Coconut Flavor Hives  ? Demerol [Meperidine] Hives  ?  agitated   ? Morphine And Related Hives  ?  Agitated   ? Phenergan [Promethazine Hcl]   ?   Tremors/ spasms   ? Zofran [Ondansetron Hcl] Hives  ? ? ? ?ROS ? ?As noted in HPI.  ? ?Physical Exam ? ?BP (!) 135/100 (BP Location: Left Arm)   Pulse 83   Temp 98.3 ?F (36.8 ?C) (Oral)   Resp 18   Ht '5\' 6"'$  (1.676 m)   Wt 88 kg   SpO2 98%   BMI 31.31 kg/m?  ? ?Constitutional: Well developed, well nourished, no acute distress ?Eyes:  EOMI, conjunctiva normal bilaterally ?HENT: Normocephalic, atraumatic,mucus membranes moist ?Respiratory: Normal inspiratory effort ?Cardiovascular: Normal rate ?GI: nondistended ?skin: No rash, skin intact ?Musculoskeletal: Positive tenderness at the medial right foot/arch along the navicular bone.  Skin intact.  No other tenderness over the foot.  No plantar tenderness.  No tenderness of the calcaneus, Achilles tendon, base of fifth metatarsal.  No bruising, or apparent soft tissue swelling.  No tenderness over the heel.  Sensation grossly intact.  DP 2+. ?Neurologic: Alert & oriented x 3, no focal neuro deficits ?Psychiatric: Speech and behavior appropriate ? ? ?ED Course ? ? ?Medications - No data to display ? ?Orders Placed This Encounter  ?Procedures  ? DG Foot Complete Right  ?  Standing Status:   Standing  ?  Number of Occurrences:   1  ?  Order Specific Question:   Reason for Exam (SYMPTOM  OR DIAGNOSIS REQUIRED)  ?  Answer:   History of neuropathy, atraumatic pain, tenderness medial right foot rule out navicular/midfoot fracture  ? Apply CAM boot  ?  Standing Status:   Standing  ?  Number of Occurrences:   1  ?  Order Specific Question:   Laterality  ?  Answer:   Right  ? ? ?No results found for this or any previous visit (from the past 24 hour(s)). ?DG Foot Complete Right ? ?Result Date: 03/22/2022 ?CLINICAL DATA:  Medial right foot tenderness. Evaluate for navicular/midfoot fracture. History of neuropathy. 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 There is increased medial midfoot soft tissue swelling medial to the navicular. Moderate plantar calcaneal heel spur. No acute fracture or dislocation. 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 New overlying medial midfoot soft tissue swelling. This may be secondary to stress related change at the medial navicular bone  and/or tendinopathy at the posterior tibial tendon insertion. Electronically Signed   By: Yvonne Kendall M.D.   On: 03/22/2022 15:52   ? ?ED Clinical Impression ? ?1. Foot pain, right   ?  ? ?ED Assessment/Plan ? ? ?Reviewed imaging independently.  New overlying medial midfoot soft tissue swelling.  May be secondary to stress related change of the medial navicular bone and/or tendinopathy at the posterior tibial tendon insertion.  See radiology report for full details. ? ?Placing in CAM Walker.  Patient states she has tramadol and Neurontin already prescribed to her for pain, does not want another prescription for pain medication.  She states NSAIDs are relatively contraindicated as she is currently recovering from shoulder surgery and states that her orthopedist does not want her on any NSAIDs.  Follow-up with [facility] if not getting better in a week or 2 with rest, supportive treatment. ? ?Discussed imaging, MDM, treatment plan, and plan for follow-up with patient.patient agrees with plan.  ? ?No orders of the defined types were placed in this encounter. ? ? ? ? ?*This clinic note was created using [employer]. Therefore, there may be occasional mistakes despite careful proofreading. ? ?? ? ?  ?Melynda Ripple, MD ?03/23/22 [phone] ? ?

## 2022-03-22 NOTE — ED Triage Notes (Signed)
Patient is having Right foot pain (arch). No injury known. "Woke up with pain after trying to put pressure on it, Managing walking on side of foot, not flat".  ?

## 2022-05-16 ENCOUNTER — Encounter: Payer: Self-pay | Admitting: Student in an Organized Health Care Education/Training Program

## 2022-05-16 ENCOUNTER — Ambulatory Visit
Payer: 59 | Attending: Student in an Organized Health Care Education/Training Program | Admitting: Student in an Organized Health Care Education/Training Program

## 2022-05-16 VITALS — BP 129/84 | HR 78 | Temp 97.1°F | Resp 16 | Ht 66.0 in | Wt 190.0 lb

## 2022-05-16 DIAGNOSIS — L4050 Arthropathic psoriasis, unspecified: Secondary | ICD-10-CM | POA: Diagnosis present

## 2022-05-16 DIAGNOSIS — M1712 Unilateral primary osteoarthritis, left knee: Secondary | ICD-10-CM | POA: Diagnosis not present

## 2022-05-16 DIAGNOSIS — G894 Chronic pain syndrome: Secondary | ICD-10-CM | POA: Insufficient documentation

## 2022-05-16 DIAGNOSIS — Z96651 Presence of right artificial knee joint: Secondary | ICD-10-CM | POA: Insufficient documentation

## 2022-05-16 DIAGNOSIS — S46012A Strain of muscle(s) and tendon(s) of the rotator cuff of left shoulder, initial encounter: Secondary | ICD-10-CM | POA: Diagnosis not present

## 2022-05-16 DIAGNOSIS — S46012S Strain of muscle(s) and tendon(s) of the rotator cuff of left shoulder, sequela: Secondary | ICD-10-CM | POA: Insufficient documentation

## 2022-05-16 DIAGNOSIS — G8929 Other chronic pain: Secondary | ICD-10-CM | POA: Insufficient documentation

## 2022-05-16 DIAGNOSIS — M25562 Pain in left knee: Secondary | ICD-10-CM | POA: Diagnosis not present

## 2022-05-16 MED ORDER — TRAMADOL HCL 50 MG PO TABS: 50.0000 mg | ORAL_TABLET | Freq: Four times a day (QID) | ORAL | 3 refills | Status: AC | PRN

## 2022-09-13 ENCOUNTER — Other Ambulatory Visit: Payer: Self-pay | Admitting: Student in an Organized Health Care Education/Training Program

## 2022-10-18 ENCOUNTER — Ambulatory Visit
Admission: RE | Admit: 2022-10-18 | Discharge: 2022-10-18 | Disposition: A | Discharge status: Home / Self Care | Payer: 59 | Source: Ambulatory Visit | Attending: Family Medicine | Admitting: Family Medicine

## 2022-10-18 VITALS — BP 142/93 | HR 66 | Temp 98.1°F | Resp 18

## 2022-10-18 DIAGNOSIS — F17200 Nicotine dependence, unspecified, uncomplicated: Secondary | ICD-10-CM

## 2022-10-18 DIAGNOSIS — J40 Bronchitis, not specified as acute or chronic: Secondary | ICD-10-CM | POA: Diagnosis not present

## 2022-10-18 MED ORDER — BENZONATATE 100 MG PO CAPS: 200.0000 mg | ORAL_CAPSULE | Freq: Three times a day (TID) | ORAL | 0 refills | Status: DC | PRN

## 2022-10-18 MED ORDER — PREDNISONE 50 MG PO TABS: 50.0000 mg | ORAL_TABLET | Freq: Every day | ORAL | 0 refills | Status: AC

## 2022-10-18 MED ORDER — AZITHROMYCIN 250 MG PO TABS: 250.0000 mg | ORAL_TABLET | Freq: Every day | ORAL | 0 refills | Status: DC

## 2022-10-18 NOTE — ED Triage Notes (Addendum)
Pt present coughing with congestion and fever. Symptoms started a week ago. Pt states that tried OTC medication with no relief. Pt tested negative for covid 47 yesterday.  Pt states having sinus pressure.

## 2022-10-18 NOTE — Discharge Instructions (Addendum)
Stop by the pharmacy to pick up your prescriptions.  Follow up with your primary care provider as needed.  

## 2022-10-18 NOTE — ED Provider Notes (Signed)
[facility]    CSN: [id number] Arrival date & time: 10/18/22  1230      History   Chief Complaint Chief Complaint  Patient presents with   Cough    Congestion, sinus pain, ear pain, mucus is greenish color . Had fever of 100.6 - Entered by patient    HPI Amy Logan is a 51 y.o. female.   HPI   Amy Logan presents for cough for the past week or so. She feels like she is draining the back of your throat.  Has to sit upright otherwise she feels like she is choking.  She is having sinus pressure and headache.  Sputum and discharge is green. Fever last night,  100.6 F.   Her COVID test yesterday was negative at work. She took Tylenol cold and flu severe and Tussin-DM without relief. She smokes tobacco.    Fever : yes Chills: no Sore throat: no   Cough: no Sputum: no Nasal congestion : yes Rhinorrhea: yes Myalgias: no Appetite: normal  Hydration: normal  Abdominal pain: no Nausea: no Vomiting: no Diarrhea: No Rash: not new Sleep disturbance: yes Headache: yes Ear pain: right > left      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   Fibromyalgia    History of hiatal hernia    SMALL   History of kidney stones    H/O   Hypertension    Neuropathy     Patient Active Problem List   Diagnosis Date Noted   Stiffness of shoulder joint, left 02/01/2022   Traumatic complete tear of left rotator cuff 01/19/2022   Chronic pain syndrome 01/19/2022   Tear of left rotator cuff 01/15/2022   Arthritis of left acromioclavicular joint 01/03/2022   Tendonitis of shoulder, left 01/03/2022   Muscle strain of left shoulder 01/03/2022   Shoulder pain, left 01/03/2022   Arthritis of left shoulder region 01/03/2022   Primary osteoarthritis of left knee 03/08/2021   Fibromyalgia 08/18/2020   Chronic pain of left knee 08/18/2020   Status post total right knee replacement  08/18/2020   Psoriatic arthritis ([place]) 08/18/2020   Psoriasis 04/15/2020   Encounter for long-term (current) use of high-risk medication 04/15/2020   Increased band cell count 03/20/2019   Chronic fatigue 03/20/2019   RLQ abdominal pain 12/30/2018   Leukocytosis 12/30/2018   Anxiety and depression 10/24/2018   Arthritis 10/24/2018   Chronic, continuous use of opioids 10/24/2018   Chronic insomnia 10/24/2018   Hyperlipidemia 10/24/2018   Hypertension 10/24/2018   Neuropathy 10/24/2018   Obesity (BMI 30-39.9) 10/24/2018   H/O excision of epidermal inclusion cyst 10/24/2018   SIRS (systemic inflammatory response syndrome) ([place]) 10/14/2018   Epidermal inclusion cyst    Mass of soft tissue of abdomen 09/30/2018   B12 deficiency 11/20/2016   Vitamin D insufficiency 11/20/2016   Hx of normocytic normochromic anemia 12/20/2015   Incomplete tear of right rotator cuff 11/21/2015    Past Surgical History:  Procedure Laterality Date   ABDOMINAL HYSTERECTOMY     APPENDECTOMY     BREAST CYST ASPIRATION Left    neg   CHOLECYSTECTOMY     COLONOSCOPY  2018   cyst removed     thyroglossal duct cyst   JOINT REPLACEMENT     LIPOMA EXCISION Right 10/09/2018   NOT A LIPOMA.  EPIDERMAL INCLUSION CYST  REPLACEMENT TOTAL KNEE     Right knee    OB History   No obstetric history on file.      Home Medications    Prior to Admission medications   Medication Sig Start Date End Date Taking? Authorizing Provider  azithromycin (ZITHROMAX Z-PAK) 250 MG tablet Take 1 tablet (250 mg total) by mouth daily. Take 2 tablets on day 1, day 2-5 take 1 tablet day 10/18/22  Yes Yoni Lobos, DO  benzonatate (TESSALON) 100 MG capsule Take 2 capsules (200 mg total) by mouth 3 (three) times daily as needed for cough. 10/18/22  Yes Barnie Sopko, DO  predniSONE (DELTASONE) 50 MG tablet Take 1 tablet (50 mg total) by mouth daily for 5 days. 10/18/22 10/23/22 Yes Diandre Merica, DO  cetirizine (ZYRTEC) 10  MG tablet Take 10 mg by mouth daily as needed for allergies.    [provider]  DULoxetine (CYMBALTA) 30 MG capsule Take 1 capsule (30 mg total) by mouth daily. 01/19/22 07/18/22  Gillis Santa, MD  furosemide (LASIX) 20 MG tablet Take 20 mg by mouth daily as needed. 10/28/20   [provider]  gabapentin (NEURONTIN) 300 MG capsule Take 1 capsule (300 mg total) by mouth 2 (two) times daily. 09/05/21   Gillis Santa, MD  lisinopril-hydrochlorothiazide (PRINZIDE,ZESTORETIC) 20-25 MG tablet Take 1 tablet by mouth every morning.    [provider]  omeprazole (PRILOSEC) 40 MG capsule Take 1 capsule by mouth daily. 11/22/21   [provider]  traMADol (ULTRAM) 50 MG tablet Take 1 tablet (50 mg total) by mouth every 6 (six) hours as needed for severe pain. 06/21/22 10/19/22  Gillis Santa, MD  triamcinolone (KENALOG) 0.025 % cream Apply 1 application topically 2 (two) times daily.    [provider]    Family History Family History  Problem Relation Age of Onset   Diabetes Mother    Heart failure Mother    Asthma Mother    Cancer Father    Breast cancer Cousin 81       mat cousin    Social History Social History   Tobacco Use   Smoking status: Every Day    Packs/day: 0.50    Years: 25.00    Total pack years: 12.50    Types: Cigarettes   Smokeless tobacco: Never  Vaping Use   Vaping Use: Never used  Substance Use Topics   Alcohol use: Yes    Alcohol/week: 0.0 standard drinks of alcohol    Comment: social   Drug use: No     Allergies   Meperidine hcl, Morphine, Ondansetron, Onion, Sumatriptan, Butorphanol, Promethazine, Coconut flavor, Demerol [meperidine], Morphine and related, Phenergan [promethazine hcl], and Zofran [ondansetron hcl]   Review of Systems Review of Systems: negative unless otherwise stated in HPI.      Physical Exam Triage Vital Signs ED Triage Vitals [10/18/22 1305]  Enc Vitals Group     BP (!) 142/93     Pulse  Rate 66     Resp 18     Temp 98.1 F (36.7 C)     Temp Source Oral     SpO2 96 %     Weight      Height      Head Circumference      Peak Flow      Pain Score 6     Pain Loc      Pain Edu?      Excl. in [place]?    No data  found.  Updated Vital Signs BP (!) 142/93 (BP Location: Left Arm)   Pulse 66   Temp 98.1 F (36.7 C) (Oral)   Resp 18   SpO2 96%   Visual Acuity Right Eye Distance:   Left Eye Distance:   Bilateral Distance:    Right Eye Near:   Left Eye Near:    Bilateral Near:     Physical Exam GEN:     alert, non-toxic appearing female in no distress    HENT:  mucus membranes moist, oropharyngeal without lesions or exudate, no tonsillar hypertrophy,  mild oropharyngeal erythema,  moderate erythematous edematous turbinates, clear nasal discharge EYES:   pupils equal and reactive, no scleral injection or discharge NECK:  normal ROM, no lymphadenopathy, no meningismus   RESP:  no increased work of breathing, diffuse expiratory wheezing, coarse breath sounds throughout CVS:   regular rate and rhythm Skin:   warm and dry, psoriatic rash on extremities    UC Treatments / Results  Labs (all labs ordered are listed, but only abnormal results are displayed) Labs Reviewed - No data to display  EKG   Radiology No results found.  Procedures Procedures (including critical care time)  Medications Ordered in UC Medications - No data to display  Initial Impression / Assessment and Plan / UC Course  I have reviewed the triage vital signs and the nursing notes.  Pertinent labs & imaging results that were available during my care of the patient were reviewed by me and considered in my medical decision making (see chart for details).       Pt is a 51 y.o. female with tobacco use who presents for 1 week of cough that is acutely worsening.  Pattie is  afebrile here without recent antipyretics. Satting adequately on room air. Overall pt is  non-toxic appearing, well  hydrated, without respiratory distress. Pulmonary exam is remarkable for expiratory wheezing and coarse breath sounds throughout.  After shared decision making we will not pursue chest x-ray at this time as it currently would not change management.  COVID  and influenza testing deferred.  She has had a negative work COVID test yesterday.  Treat for presumed COPD/acute bronchitis with steroids and antibiotics as below.  Tessalon Perles given for cough.  Reminded patient to use her inhaler.  Typical duration of symptoms discussed. Return and ED precautions given and patient voiced understanding.   Discussed MDM, treatment plan and plan for follow-up with patient who agrees with plan.      Final Clinical Impressions(s) / UC Diagnoses   Final diagnoses:  Bronchitis  Tobacco use disorder     Discharge Instructions      Stop by the pharmacy to pick up your prescriptions.  Follow up with your primary care provider as needed.       ED Prescriptions     Medication Sig Dispense Auth. Provider   benzonatate (TESSALON) 100 MG capsule Take 2 capsules (200 mg total) by mouth 3 (three) times daily as needed for cough. 21 capsule Jaydan Chretien, DO   azithromycin (ZITHROMAX Z-PAK) 250 MG tablet Take 1 tablet (250 mg total) by mouth daily. Take 2 tablets on day 1, day 2-5 take 1 tablet day 6 tablet Pami Wool, DO   predniSONE (DELTASONE) 50 MG tablet Take 1 tablet (50 mg total) by mouth daily for 5 days. 5 tablet Lyndee Hensen, DO      PDMP not reviewed this encounter.   Lyndee Hensen, DO 10/18/22 1353

## 2022-10-24 ENCOUNTER — Telehealth: Payer: Self-pay | Admitting: Student in an Organized Health Care Education/Training Program

## 2022-10-24 NOTE — Telephone Encounter (Signed)
Patient is calling to get appt for refills, I explained Dr Holley Raring is out of the office until 11-06-22 and does not have any appts available until Jan 2023. She forgot to call and schedule appt to refill scripts and has been out a while.  I asked her to call back on 11-07-22 to see if Dr Holley Raring will refill scripts and let her come in Jan for med mgmt.

## 2022-10-26 ENCOUNTER — Ambulatory Visit
Admission: EM | Admit: 2022-10-26 | Discharge: 2022-10-26 | Disposition: A | Discharge status: Home / Self Care | Payer: 59 | Attending: Internal Medicine | Admitting: Internal Medicine

## 2022-10-26 ENCOUNTER — Ambulatory Visit (INDEPENDENT_AMBULATORY_CARE_PROVIDER_SITE_OTHER): Payer: 59

## 2022-10-26 DIAGNOSIS — R0602 Shortness of breath: Secondary | ICD-10-CM

## 2022-10-26 DIAGNOSIS — R062 Wheezing: Secondary | ICD-10-CM

## 2022-10-26 DIAGNOSIS — R059 Cough, unspecified: Secondary | ICD-10-CM | POA: Diagnosis not present

## 2022-10-26 DIAGNOSIS — J208 Acute bronchitis due to other specified organisms: Secondary | ICD-10-CM

## 2022-10-26 MED ORDER — ALBUTEROL SULFATE (2.5 MG/3ML) 0.083% IN NEBU
2.5000 mg | INHALATION_SOLUTION | Freq: Once | RESPIRATORY_TRACT | Status: AC
  Administered 2022-10-26: 2.5 mg via RESPIRATORY_TRACT

## 2022-10-26 MED ORDER — PREDNISONE 20 MG PO TABS: ORAL_TABLET | ORAL | 0 refills | Status: DC

## 2022-10-26 MED ORDER — BUDESONIDE-FORMOTEROL FUMARATE 160-4.5 MCG/ACT IN AERO: 2.0000 | INHALATION_SPRAY | Freq: Two times a day (BID) | RESPIRATORY_TRACT | 0 refills | Status: AC

## 2022-10-26 MED ORDER — IPRATROPIUM-ALBUTEROL 0.5-2.5 (3) MG/3ML IN SOLN
3.0000 mL | Freq: Once | RESPIRATORY_TRACT | Status: AC
  Administered 2022-10-26: 3 mL via RESPIRATORY_TRACT

## 2022-10-26 MED ORDER — METHYLPREDNISOLONE SODIUM SUCC 40 MG IJ SOLR
60.0000 mg | Freq: Once | INTRAMUSCULAR | Status: AC
  Administered 2022-10-26: 60 mg via INTRAMUSCULAR

## 2022-10-26 NOTE — ED Notes (Signed)
6 minute walk test completed. At 1 min 50 seconds patients 02 dropped to 89%, at 2 mins 02 sat reading 91%, at 4 mins 02 sat 88%, then 87% at 6 minutes. At rest readings 02 increased to 95-96%, unable to tolerate more than 20-30 seconds of ambulation without resting. Provider notified.

## 2022-10-26 NOTE — Discharge Instructions (Addendum)
Start  the prednisone tomorrow Follow up with your PCP or pulmonologist in 7 days

## 2022-10-26 NOTE — ED Triage Notes (Signed)
Chief Complaint: Patient was diagnosed with bronchitis 10/18/22. States starting getting fluid in the lungs. No chest x-ray was done. Patient having continued productive cough.   Onset: Tuesday  Prescriptions or OTC medications tried: Yes- prednisone, z-pak, inhaler    with little relief

## 2022-10-26 NOTE — ED Notes (Addendum)
Post neb treatment, patient ambulated for 6 mins. At 3 mins 30 seconds patient 02 dropped to 84-85%. Pt reported SOB during walk. Took a break to catch breath, 02 increased to 95-96%. When ambulation resumed at 20 seconds into walking 02 dropped back down to 85%. Cycle continued until completing full 6 mins. Provider notified. EKG ordered.

## 2022-10-26 NOTE — ED Provider Notes (Signed)
[facility]    CSN: [id number] Arrival date & time: 10/26/22  1312      History   Chief Complaint Chief Complaint  Patient presents with   Cough    HPI Amy Logan is a 51 y.o. female who presents with unresolved cough since seen here for bronchitis on 11/29 and was placed on Z pack and Prednisone. Her initial cough started 11/22  and while on the mentioned meds she got a little better, but never improved much. Yesterday she had severe SOB while walking and almost fainted x 4, so had to sit down and the RN in charge at work gave her O2 which helped pt. She states that her facility lost their pulse ox, so she could not check it. Pt feels she has fluid in her lungs worse when she lays down. She states the rhinitis is now clear which was purulent before starting the antibiotics. Denies leg swelling, calf pain or long rides or long flights in the past month    Past Medical History:  Diagnosis Date   Anemia    Anxiety    Arthritis    Bradycardia    Cancer ([place]) 1990   cervical cells stage 4   Fibromyalgia    History of hiatal hernia    SMALL   History of kidney stones    H/O   Hypertension    Neuropathy     Patient Active Problem List   Diagnosis Date Noted   Stiffness of shoulder joint, left 02/01/2022   Traumatic complete tear of left rotator cuff 01/19/2022   Chronic pain syndrome 01/19/2022   Tear of left rotator cuff 01/15/2022   Arthritis of left acromioclavicular joint 01/03/2022   Tendonitis of shoulder, left 01/03/2022   Muscle strain of left shoulder 01/03/2022   Shoulder pain, left 01/03/2022   Arthritis of left shoulder region 01/03/2022   Primary osteoarthritis of left knee 03/08/2021   Fibromyalgia 08/18/2020   Chronic pain of left knee 08/18/2020   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   Psoriatic arthritis ([place]) 08/18/2020   Psoriasis 04/15/2020   Encounter for long-term (current) use of high-risk medication 04/15/2020    Increased band cell count 03/20/2019   Chronic fatigue 03/20/2019   RLQ abdominal pain 12/30/2018   Leukocytosis 12/30/2018   Anxiety and depression 10/24/2018   Arthritis 10/24/2018   Chronic, continuous use of opioids 10/24/2018   Chronic insomnia 10/24/2018   Hyperlipidemia 10/24/2018   Hypertension 10/24/2018   Neuropathy 10/24/2018   Obesity (BMI 30-39.9) 10/24/2018   H/O excision of epidermal inclusion cyst 10/24/2018   SIRS (systemic inflammatory response syndrome) ([place]) 10/14/2018   Epidermal inclusion cyst    Mass of soft tissue of abdomen 09/30/2018   B12 deficiency 11/20/2016   Vitamin D insufficiency 11/20/2016   Hx of normocytic normochromic anemia 12/20/2015   Incomplete tear of right rotator cuff 11/21/2015    Past Surgical History:  Procedure Laterality Date   ABDOMINAL HYSTERECTOMY     APPENDECTOMY     BREAST CYST ASPIRATION Left    neg   CHOLECYSTECTOMY     COLONOSCOPY  2018   cyst removed     thyroglossal duct cyst   JOINT REPLACEMENT     LIPOMA EXCISION Right 10/09/2018   NOT A LIPOMA.  EPIDERMAL INCLUSION CYST   REPLACEMENT TOTAL KNEE     Right knee    OB History   No obstetric history on file.      Home Medications  Prior to Admission medications   Medication Sig Start Date End Date Taking? Authorizing Provider  budesonide-formoterol (SYMBICORT) 160-4.5 MCG/ACT inhaler Inhale 2 puffs into the lungs 2 (two) times daily. 10/26/22  Yes Rodriguez-Southworth, Sunday Spillers, PA-C  predniSONE (DELTASONE) 20 MG tablet 2 qd x 7 days 10/26/22  Yes Rodriguez-Southworth, Sunday Spillers, PA-C  cetirizine (ZYRTEC) 10 MG tablet Take 10 mg by mouth daily as needed for allergies.    [provider]  DULoxetine (CYMBALTA) 30 MG capsule Take 1 capsule (30 mg total) by mouth daily. 01/19/22 07/18/22  Gillis Santa, MD  furosemide (LASIX) 20 MG tablet Take 20 mg by mouth daily as needed. 10/28/20   [provider]  gabapentin (NEURONTIN) 300 MG capsule Take 1  capsule (300 mg total) by mouth 2 (two) times daily. 09/05/21   Gillis Santa, MD  lisinopril-hydrochlorothiazide (PRINZIDE,ZESTORETIC) 20-25 MG tablet Take 1 tablet by mouth every morning.    [provider]  omeprazole (PRILOSEC) 40 MG capsule Take 1 capsule by mouth daily. 11/22/21   [provider]  triamcinolone (KENALOG) 0.025 % cream Apply 1 application topically 2 (two) times daily.    [provider]    Family History Family History  Problem Relation Age of Onset   Diabetes Mother    Heart failure Mother    Asthma Mother    Cancer Father    Breast cancer Cousin 26       mat cousin    Social History Social History   Tobacco Use   Smoking status: Every Day    Packs/day: 0.50    Years: 25.00    Total pack years: 12.50    Types: Cigarettes   Smokeless tobacco: Never  Vaping Use   Vaping Use: Never used  Substance Use Topics   Alcohol use: Yes    Alcohol/week: 0.0 standard drinks of alcohol    Comment: social   Drug use: No     Allergies   Meperidine hcl, Morphine, Ondansetron, Onion, Sumatriptan, Butorphanol, Promethazine, Coconut flavor, Demerol [meperidine], Morphine and related, Phenergan [promethazine hcl], and Zofran [ondansetron hcl]   Review of Systems Review of Systems  Constitutional:  Positive for fatigue. Negative for activity change, appetite change and fever.  HENT:  Positive for rhinorrhea. Negative for congestion, ear discharge, ear pain and postnasal drip.   Eyes:  Negative for discharge.  Respiratory:  Positive for cough, shortness of breath and wheezing. Negative for chest tightness.   Cardiovascular:  Negative for chest pain.     Physical Exam Triage Vital Signs ED Triage Vitals  Enc Vitals Group     BP 10/26/22 1339 (!) 143/77     Pulse Rate 10/26/22 1339 76     Resp 10/26/22 1339 16     Temp 10/26/22 1339 98.1 F (36.7 C)     Temp Source 10/26/22 1339 Oral     SpO2 10/26/22 1339 99 %     Weight --       Height --      Head Circumference --      Peak Flow --      Pain Score 10/26/22 1338 3     Pain Loc --      Pain Edu? --      Excl. in [place]? --    No data found.  Updated Vital Signs BP (!) 143/77 (BP Location: Left Arm)   Pulse 76   Temp 98.1 F (36.7 C) (Oral)   Resp 16   SpO2 99%   Visual  Acuity Right Eye Distance:   Left Eye Distance:   Bilateral Distance:    Right Eye Near:   Left Eye Near:    Bilateral Near:      Physical Exam Constitutional:      General: He is not in acute distress.    Appearance: He is not toxic-appearing.  HENT:     Head: Normocephalic.     Right Ear: Tympanic membrane, ear canal and external ear normal.     Left Ear: Ear canal and external ear normal.     Nose: Nose normal.     Mouth/Throat:     Mouth: Mucous membranes are moist.     Pharynx: Oropharynx is clear.  Eyes:     General: No scleral icterus.    Conjunctiva/sclera: Conjunctivae normal.  Cardiovascular:     Rate and Rhythm: Normal rate and regular rhythm.     Heart sounds: No murmur heard.   Pulmonary:     Effort: Pulmonary effort is normal. No respiratory distress.     Breath sounds: Wheezing present.     Comments: Has auditory wheezing Musculoskeletal:        General: Normal range of motion.     Cervical back: Neck supple. No calf tenderness or cords. No edema present.  Lymphadenopathy:     Cervical: No cervical adenopathy.  Skin:    General: Skin is warm and dry.     Findings: No rash.  Neurological:     Mental Status: He is alert and oriented to person, place, and time.     Gait: Gait normal.  Psychiatric:        Mood and Affect: Mood normal.        Behavior: Behavior normal.        Thought Content: Thought content normal.        Judgment: Judgment normal.    UC Treatments / Results  Labs (all labs ordered are listed, but only abnormal results are displayed) Labs Reviewed - No data to display  EKG NSR Normal EKG  Radiology DG Chest 2 View  Result  Date: 10/26/2022 CLINICAL DATA:  Persistent cough EXAM: CHEST - 2 VIEW COMPARISON:  03/05/2013 FINDINGS: The heart size and mediastinal contours are within normal limits. Both lungs are clear. The visualized skeletal structures are unremarkable. IMPRESSION: No active cardiopulmonary disease. Electronically Signed   By: Donavan Foil M.D.   On: 10/26/2022 15:52    Procedures Procedures (including critical care time)  Medications Ordered in UC Medications  ipratropium-albuterol (DUONEB) 0.5-2.5 (3) MG/3ML nebulizer solution 3 mL (3 mLs Nebulization Given 10/26/22 1617)  methylPREDNISolone sodium succinate (SOLU-MEDROL) 40 mg/mL injection 60 mg (60 mg Intramuscular Given 10/26/22 1719)  albuterol (PROVENTIL) (2.5 MG/3ML) 0.083% nebulizer solution 2.5 mg (2.5 mg Nebulization Given 10/26/22 1719)    Initial Impression / Assessment and Plan / UC Course  I have reviewed the triage vital signs and the nursing notes.  Pertinent  imaging results that were available during my care of the patient were reviewed by me and considered in my medical decision making (see chart for details). Six munite walk test was done, and after one minute she desaturated from 84-88% after 3 minutes. She would rest and pulse ox would go back to 95%, but she would desat again to 84%. Pt wanted to finish the full 6 minutes which she did with rests in between.  She was given Solumedrol 60 mg IM and Albuterol Neb. Thirty minutes later I re-examined her and the wheezing  was almost fully resolved, and she was feeling better. Six minute walk was repeated she only desaturated  to 88%. I discussed this case with Dr Lanny Cramp and he advised to placed pt on Symbicort which I prescribed as noted, and Albuterol which pt states she has at home. Also to place her on Prednisone 40 mg qd x 7 days and needs to FU with PCP or pulmonologist.  Possibly undiagnosed COPD exacerbation  I discussed this case with Dr Lanny Cramp and he advised to placed pt on  Symbicort which I prescribed as noted, and Albuterol which pt states she has at home. Also to place her on Prednisone 40 mg qd x 7 days and needs toFU with PCP or pulmonologist.  Final Clinical Impressions(s) / UC Diagnoses   Final diagnoses:  SOB (shortness of breath) on exertion  Wheezing  Acute bronchitis due to other specified organisms     Discharge Instructions      Start  the prednisone tomorrow Follow up with your PCP or pulmonologist in 7 days       ED Prescriptions     Medication Sig Dispense Auth. Provider   predniSONE (DELTASONE) 20 MG tablet 2 qd x 7 days 14 tablet Rodriguez-Southworth, Qunisha Bryk, PA-C   budesonide-formoterol (SYMBICORT) 160-4.5 MCG/ACT inhaler Inhale 2 puffs into the lungs 2 (two) times daily. 1 each Rodriguez-Southworth, Sunday Spillers, PA-C      PDMP not reviewed this encounter.   Shelby Mattocks, [place] 10/26/22 1823

## 2022-11-03 ENCOUNTER — Other Ambulatory Visit: Payer: Self-pay | Admitting: Student in an Organized Health Care Education/Training Program

## 2022-12-14 ENCOUNTER — Encounter: Payer: Self-pay | Admitting: Student in an Organized Health Care Education/Training Program

## 2022-12-14 ENCOUNTER — Ambulatory Visit
Payer: 59 | Attending: Student in an Organized Health Care Education/Training Program | Admitting: Student in an Organized Health Care Education/Training Program

## 2022-12-14 VITALS — BP 148/91 | HR 68 | Temp 96.9°F | Resp 16 | Ht 66.0 in | Wt 194.0 lb

## 2022-12-14 DIAGNOSIS — M25562 Pain in left knee: Secondary | ICD-10-CM | POA: Insufficient documentation

## 2022-12-14 DIAGNOSIS — Z96651 Presence of right artificial knee joint: Secondary | ICD-10-CM | POA: Insufficient documentation

## 2022-12-14 DIAGNOSIS — L4050 Arthropathic psoriasis, unspecified: Secondary | ICD-10-CM | POA: Diagnosis not present

## 2022-12-14 DIAGNOSIS — M797 Fibromyalgia: Secondary | ICD-10-CM | POA: Diagnosis not present

## 2022-12-14 DIAGNOSIS — G894 Chronic pain syndrome: Secondary | ICD-10-CM

## 2022-12-14 DIAGNOSIS — S46012S Strain of muscle(s) and tendon(s) of the rotator cuff of left shoulder, sequela: Secondary | ICD-10-CM | POA: Diagnosis not present

## 2022-12-14 DIAGNOSIS — M1712 Unilateral primary osteoarthritis, left knee: Secondary | ICD-10-CM | POA: Diagnosis present

## 2022-12-14 DIAGNOSIS — S46012D Strain of muscle(s) and tendon(s) of the rotator cuff of left shoulder, subsequent encounter: Secondary | ICD-10-CM | POA: Insufficient documentation

## 2022-12-14 DIAGNOSIS — G8929 Other chronic pain: Secondary | ICD-10-CM | POA: Diagnosis present

## 2022-12-14 MED ORDER — TRAMADOL HCL 50 MG PO TABS: 50.0000 mg | ORAL_TABLET | Freq: Four times a day (QID) | ORAL | 3 refills | Status: DC | PRN

## 2022-12-14 NOTE — Progress Notes (Signed)
PROVIDER NOTE: Information contained herein reflects review and annotations entered in association with encounter. Interpretation of such information and data should be left to medically-trained personnel. Information provided to patient can be located elsewhere in the medical record under "Patient Instructions". Document created using STT-dictation technology, any transcriptional errors that may result from process are unintentional.    Patient: Amy Logan  Service Category: E/M  Provider: Gillis Santa, MD  DOB: January 24, 1971  DOS: 12/14/2022  Specialty: Interventional Pain Management  MRN: [id number]  Setting: Ambulatory outpatient  PCP: Sallee Lange, NP  Type: Established Patient    Referring Provider: Sallee Lange, *  Location: Office  Delivery: Face-to-face     HPI  [name], a 52 y.o. year old female, is here today because of her Traumatic complete tear of left rotator cuff, sequela [S46.012S]. [name] primary complain today is Knee Pain (left) and Joint Pain  Last encounter: My last encounter with her was on 05/16/22  Pertinent problems: [name] has Incomplete tear of right rotator cuff; Neuropathy; Obesity (BMI 30-39.9); Fibromyalgia; Chronic pain of left knee; Status post total right knee replacement; and Psoriatic arthritis ([place]) on their pertinent problem list. Pain Assessment: Severity of Chronic pain is reported as a 7 /10. Location: Knee Left/denies. Onset: More than a month ago. Quality: Aching, Throbbing. Timing: Constant. Modifying factor(s): watrm bath. Vitals:  height is '5\' 6"'$  (1.676 m) and weight is 194 lb (88 kg). Her temperature is 96.9 F (36.1 C) (abnormal). Her blood pressure is 148/91 (abnormal) and her pulse is 68. Her respiration is 16 and oxygen saturation is 100%.   Reason for encounter: medication management.    Persistent left shoulder pain, 2/2 to OA Has been out of her Tramadol over 4 weeks Chronic left knee pain 2/2 to knee OA Diffuse  arthralgias and myalgias Using Ibuprofen 800 mg TID prn, cautioned on high dose NSAID use and risk of CKD and gastric ulcers   Pharmacotherapy Assessment  Analgesic: Tramadol 50 mg every 6 hrs needed, quantity 120/month    Monitoring: [place] PMP: PDMP reviewed during this encounter.       Pharmacotherapy: No side-effects or adverse reactions reported. Compliance: No problems identified. Effectiveness: Clinically acceptable.  UDS:  Summary  Date Value Ref Range Status  09/05/2021 Note  Final    Comment:    ==================================================================== [employer] Select 13 (MW) ==================================================================== Test                             Result       Flag       Units  Drug Present and Declared for Prescription Verification   Tramadol                       4966         EXPECTED   ng/mg creat   O-Desmethyltramadol            2340         EXPECTED   ng/mg creat   N-Desmethyltramadol            395          EXPECTED   ng/mg creat    Source of tramadol is a prescription medication. O-desmethyltramadol    and N-desmethyltramadol are expected metabolites of tramadol.  ==================================================================== Test  Result    Flag   Units      Ref Range   Creatinine              93               mg/dL      >=20 ==================================================================== Declared Medications:  The flagging and interpretation on this report are based on the  following declared medications.  Unexpected results may arise from  inaccuracies in the declared medications.   **Note: The testing scope of this panel includes these medications:   Tramadol (Ultram)   **Note: The testing scope of this panel does not include the  following reported medications:   Adalimumab (Humira)  Amoxicillin (Augmentin)  Cetirizine (Zyrtec)  Clavulanate (Augmentin)  Duloxetine (Cymbalta)   Furosemide (Lasix)  Gabapentin (Neurontin)  Hydrochlorothiazide (Zestoretic)  Ibuprofen (Advil)  Lisinopril (Zestoretic)  Methotrexate  Naproxen (Aleve)  Omeprazole (Prilosec)  Rosuvastatin (Crestor)  Triamcinolone (Kenalog)  Vitamin B12 ==================================================================== For clinical consultation, please call [phone]. ====================================================================       ROS  Constitutional: Denies any fever or chills Gastrointestinal: No reported hemesis, hematochezia, vomiting, or acute GI distress Musculoskeletal: left shoulder pain. Neurological: No reported episodes of acute onset apraxia, aphasia, dysarthria, agnosia, amnesia, paralysis, loss of coordination, or loss of consciousness  Medication Review  DULoxetine, albuterol, budesonide-formoterol, cetirizine, cyanocobalamin, furosemide, gabapentin, lisinopril-hydrochlorothiazide, omeprazole, predniSONE, traMADol, and triamcinolone  History Review  Allergy: [name] is allergic to meperidine hcl, morphine, ondansetron, onion, sumatriptan, butorphanol, promethazine, coconut flavor, demerol [meperidine], morphine and related, phenergan [promethazine hcl], and zofran [ondansetron hcl]. Drug: [name]  reports no history of drug use. Alcohol:  reports current alcohol use. Tobacco:  reports that she has been smoking cigarettes. She has a 12.50 pack-year smoking history. She has never used smokeless tobacco. Social: [name]  reports that she has been smoking cigarettes. She has a 12.50 pack-year smoking history. She has never used smokeless tobacco. She reports current alcohol use. She reports that she does not use drugs. Medical:  has a past medical history of Anemia, Anxiety, Arthritis, Bradycardia, Cancer ([place]) (1990), Fibromyalgia, History of hiatal hernia, History of kidney stones, Hypertension, and Neuropathy. Surgical: [name]  has a past surgical history  that includes Cholecystectomy; Appendectomy; Replacement total knee; Abdominal hysterectomy; Joint replacement; Breast cyst aspiration (Left); Colonoscopy (2018); cyst removed; and Lipoma excision (Right, 10/09/2018). Family: family history includes Asthma in her mother; Breast cancer (age of onset: 42) in her cousin; Cancer in her father; Diabetes in her mother; Heart failure in her mother.  Laboratory Chemistry Profile   Renal Lab Results  Component Value Date   BUN 8 12/31/2018   CREATININE 0.71 12/31/2018   GFRAA >60 12/31/2018   GFRNONAA >60 12/31/2018     Hepatic Lab Results  Component Value Date   AST 16 12/30/2018   ALT 14 12/30/2018   ALBUMIN 4.4 12/30/2018   ALKPHOS 46 12/30/2018   LIPASE 24 12/30/2018     Electrolytes Lab Results  Component Value Date   NA 138 12/31/2018   K 3.5 12/31/2018   CL 104 12/31/2018   CALCIUM 8.6 (L) 12/31/2018   MG 2.1 03/06/2013     Bone No results found for: "VD25OH", "VD125OH2TOT", "SW1093AT5", "TD3220UR4", "25OHVITD1", "25OHVITD2", "25OHVITD3", "TESTOFREE", "TESTOSTERONE"   Inflammation (CRP: Acute Phase) (ESR: Chronic Phase) Lab Results  Component Value Date   LATICACIDVEN 1.5 12/30/2018       Note: Above Lab results reviewed.  Recent Imaging Review  DG Chest  2 View CLINICAL DATA:  Persistent cough  EXAM: CHEST - 2 VIEW  COMPARISON:  03/05/2013  FINDINGS: The heart size and mediastinal contours are within normal limits. Both lungs are clear. The visualized skeletal structures are unremarkable.  IMPRESSION: No active cardiopulmonary disease.  Electronically Signed   By: Donavan Foil M.D.   On: 10/26/2022 15:52  Note: Reviewed        Physical Exam  General appearance: Well nourished, well developed, and well hydrated. In no apparent acute distress Mental status: Alert, oriented x 3 (person, place, & time)       Respiratory: No evidence of acute respiratory distress Eyes: PERLA Vitals: BP (!) 148/91  Comment: Patient states she took her bp medication.  Instructed to follow up with PCP.  Asymptomatic  Pulse 68   Temp (!) 96.9 F (36.1 C)   Resp 16   Ht '5\' 6"'$  (1.676 m)   Wt 194 lb (88 kg)   SpO2 100%   BMI 31.31 kg/m  BMI: Estimated body mass index is 31.31 kg/m as calculated from the following:   Height as of this encounter: '5\' 6"'$  (1.676 m).   Weight as of this encounter: 194 lb (88 kg). Ideal: Ideal body weight: 59.3 kg (130 lb 11.7 oz) Adjusted ideal body weight: 70.8 kg (156 lb 0.6 oz)  Upper Extremity (UE) Exam    Side: Right upper extremity  Side: Left upper extremity  Skin & Extremity Inspection: Skin color, temperature, and hair growth are WNL. No peripheral edema or cyanosis. No masses, redness, swelling, asymmetry, or associated skin lesions. No contractures.  Skin & Extremity Inspection: Skin color, temperature, and hair growth are WNL. No peripheral edema or cyanosis. No masses, redness, swelling, asymmetry, or associated skin lesions. No contractures.  Functional ROM: Unrestricted ROM          Functional ROM: Pain restricted ROM for shoulder  Muscle Tone/Strength: Functionally intact. No obvious neuro-muscular anomalies detected.  Muscle Tone/Strength: Functionally intact. No obvious neuro-muscular anomalies detected.  Sensory (Neurological): Unimpaired          Sensory (Neurological): Musculoskeletal pain pattern          Palpation: No palpable anomalies              Palpation: No palpable anomalies              Provocative Test(s):  Phalen's test: deferred Tinel's test: deferred Apley's scratch test (touch opposite shoulder):  Action 1 (Across chest): deferred Action 2 (Overhead): deferred Action 3 (LB reach): deferred   Provocative Test(s):  Phalen's test: deferred Tinel's test: deferred Apley's scratch test (touch opposite shoulder):  Action 1 (Across chest): Decreased ROM Action 2 (Overhead): Decreased ROM Action 3 (LB reach): Decreased ROM     Assessment    Status Diagnosis  Resolved Controlled Controlled 1. Traumatic complete tear of left rotator cuff, sequela   2. Primary osteoarthritis of left knee   3. Chronic pain of left knee   4. Status post total right knee replacement   5. Psoriatic arthritis ([place])   6. Fibromyalgia   7. Chronic pain syndrome        Plan of Care  [name] has a current medication list which includes the following long-term medication(s): budesonide-formoterol, cetirizine, duloxetine, furosemide, gabapentin, and omeprazole.  Pharmacotherapy (Medications Ordered): Meds ordered this encounter  Medications   traMADol (ULTRAM) 50 MG tablet    Sig: Take 1 tablet (50 mg total) by mouth every 6 (six) hours as  needed.    Dispense:  120 tablet    Refill:  3   Continue gabapentin as prescribed, continue Cymbalta as prescribed.   Follow-up plan:   Return in about 4 months (around 04/14/2023) for Medication Management, in person.      Recent Visits No visits were found meeting these conditions. Showing recent visits within past 90 days and meeting all other requirements Today's Visits Date Type Provider Dept  12/14/22 Office Visit Gillis Santa, MD [facility]  Showing today's visits and meeting all other requirements Future Appointments No visits were found meeting these conditions. Showing future appointments within next 90 days and meeting all other requirements  I discussed the assessment and treatment plan with the patient. The patient was provided an opportunity to ask questions and all were answered. The patient agreed with the plan and demonstrated an understanding of the instructions.  Patient advised to call back or seek an in-person evaluation if the symptoms or condition worsens.  Duration of encounter: 30 minutes.  Note by: Gillis Santa, MD Date: 12/14/2022; Time: 10:53 AM

## 2022-12-14 NOTE — Progress Notes (Signed)
Nursing Pain Medication Assessment:  Safety precautions to be maintained throughout the outpatient stay will include: orient to surroundings, keep bed in low position, maintain call bell within reach at all times, provide assistance with transfer out of bed and ambulation.  Medication Inspection Compliance: Amy Logan did not comply with our request to bring her pills to be counted. She was reminded that bringing the medication bottles, even when empty, is a requirement.  Medication: None brought in. Pill/Patch Count: None available to be counted. Bottle Appearance: No container available. Did not bring bottle(s) to appointment. Filled Date: N/A Last Medication intake:   october

## 2023-01-10 ENCOUNTER — Telehealth: Payer: Self-pay | Admitting: Student in an Organized Health Care Education/Training Program

## 2023-01-10 ENCOUNTER — Other Ambulatory Visit: Payer: Self-pay | Admitting: *Deleted

## 2023-01-10 MED ORDER — GABAPENTIN 300 MG PO CAPS: 300.0000 mg | ORAL_CAPSULE | Freq: Two times a day (BID) | ORAL | 11 refills | Status: DC

## 2023-01-10 NOTE — Telephone Encounter (Signed)
Patient states the pharmacy says her scripts for gabapentin are expired and will not fill them. She forgot to ask Dr Holley Raring about this on her 12-14-22 visit. Can she get scripts sent in to last until May, when she is scheduled for an appointment.

## 2023-01-10 NOTE — Telephone Encounter (Signed)
Rx request sent to Dr. Lateef 

## 2023-01-10 NOTE — Telephone Encounter (Signed)
Patient notified

## 2023-04-10 ENCOUNTER — Encounter: Payer: 59 | Admitting: Student in an Organized Health Care Education/Training Program

## 2023-04-24 ENCOUNTER — Ambulatory Visit
Payer: 59 | Attending: Student in an Organized Health Care Education/Training Program | Admitting: Student in an Organized Health Care Education/Training Program

## 2023-04-24 ENCOUNTER — Encounter: Payer: Self-pay | Admitting: Student in an Organized Health Care Education/Training Program

## 2023-04-24 DIAGNOSIS — S46012S Strain of muscle(s) and tendon(s) of the rotator cuff of left shoulder, sequela: Secondary | ICD-10-CM | POA: Insufficient documentation

## 2023-04-24 DIAGNOSIS — G8929 Other chronic pain: Secondary | ICD-10-CM | POA: Diagnosis not present

## 2023-04-24 DIAGNOSIS — M25562 Pain in left knee: Secondary | ICD-10-CM | POA: Diagnosis not present

## 2023-04-24 DIAGNOSIS — M1712 Unilateral primary osteoarthritis, left knee: Secondary | ICD-10-CM

## 2023-04-24 DIAGNOSIS — M797 Fibromyalgia: Secondary | ICD-10-CM | POA: Diagnosis not present

## 2023-04-24 DIAGNOSIS — Z96651 Presence of right artificial knee joint: Secondary | ICD-10-CM | POA: Diagnosis not present

## 2023-04-24 DIAGNOSIS — G894 Chronic pain syndrome: Secondary | ICD-10-CM | POA: Insufficient documentation

## 2023-04-24 MED ORDER — GABAPENTIN 300 MG PO CAPS: 300.0000 mg | ORAL_CAPSULE | Freq: Two times a day (BID) | ORAL | 11 refills | Status: DC

## 2023-04-24 MED ORDER — TRAMADOL HCL 50 MG PO TABS: 50.0000 mg | ORAL_TABLET | Freq: Four times a day (QID) | ORAL | 3 refills | Status: DC | PRN

## 2023-04-24 NOTE — Progress Notes (Signed)
PROVIDER NOTE: Information contained herein reflects review and annotations entered in association with encounter. Interpretation of such information and data should be left to medically-trained personnel. Information provided to patient can be located elsewhere in the medical record under "Patient Instructions". Document created using STT-dictation technology, any transcriptional errors that may result from process are unintentional.    Patient: Amy Logan  Service Category: E/M  Provider: Edward Jolly, MD  DOB: 07-Dec-1970  DOS: 04/24/2023  Specialty: Interventional Pain Management  MRN: [id number]  Setting: Ambulatory outpatient  PCP: Myrene Buddy, NP  Type: Established Patient    Referring Provider: Myrene Buddy, *  Location: Office  Delivery: Face-to-face     HPI  Ms. Shabria Tilles, a 52 y.o. year old female, is here today because of her No primary diagnosis found.. Ms. Carmouche primary complain today is Other (Psoriatic arthritis ) and Knee Pain (Left )  Last encounter: My last encounter with her was on 12/14/22  Pertinent problems: Ms. Crites has Incomplete tear of right rotator cuff; Neuropathy; Obesity (BMI 30-39.9); Fibromyalgia; Chronic pain of left knee; Status post total right knee replacement; and Psoriatic arthritis (HCC) on their pertinent problem list. Pain Assessment: Severity of Chronic pain is reported as a 6 /10. Location: Knee (psoritatic arthritis) Left/denies. Onset: More than a month ago. Quality: Discomfort, Constant, Sharp, Aching, Burning. Timing: Constant. Modifying factor(s): medications. Vitals:  height is 5' 6.5" (1.689 m) and weight is 206 lb 6.4 oz (93.6 kg). Her temporal temperature is 97.9 F (36.6 C). Her blood pressure is 110/79 and her pulse is 72. Her respiration is 16 and oxygen saturation is 99%.   Reason for encounter: medication management.    Persistent left shoulder pain, 2/2 to OA Chronic left knee pain 2/2 to knee OA; requesting a repeat  left intra-articular knee steroid injection in the next couple of weeks.  She states that she will call to schedule this. Diffuse arthralgias and myalgias Presents today for refill of her tramadol. 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 Renew annual urine toxicology screen today for medication compliance and monitoring.   Pharmacotherapy Assessment  Analgesic: Tramadol 50 mg every 6 hrs needed, quantity 120/month    Monitoring: [place] PMP: PDMP reviewed during this encounter.       Pharmacotherapy: No side-effects or adverse reactions reported. Compliance: No problems identified. Effectiveness: Clinically acceptable.  UDS:  Summary  Date Value Ref Range Status  09/05/2021 Note  Final    Comment:    ==================================================================== [employer] Select 13 (MW) ==================================================================== Test                             Result       Flag       Units  Drug Present and Declared for Prescription Verification   Tramadol                       4966         EXPECTED   ng/mg creat   O-Desmethyltramadol            2340         EXPECTED   ng/mg creat   N-Desmethyltramadol            395          EXPECTED   ng/mg creat    Source of tramadol is a prescription medication. O-desmethyltramadol    and  N-desmethyltramadol are expected metabolites of tramadol.  ==================================================================== Test                      Result    Flag   Units      Ref Range   Creatinine              93               mg/dL      >=40 ==================================================================== Declared Medications:  The flagging and interpretation on this report are based on the  following declared medications.  Unexpected results may arise from  inaccuracies in the declared medications.   **Note: The testing scope of this panel includes these medications:   Tramadol  (Ultram)   **Note: The testing scope of this panel does not include the  following reported medications:   Adalimumab (Humira)  Amoxicillin (Augmentin)  Cetirizine (Zyrtec)  Clavulanate (Augmentin)  Duloxetine (Cymbalta)  Furosemide (Lasix)  Gabapentin (Neurontin)  Hydrochlorothiazide (Zestoretic)  Ibuprofen (Advil)  Lisinopril (Zestoretic)  Methotrexate  Naproxen (Aleve)  Omeprazole (Prilosec)  Rosuvastatin (Crestor)  Triamcinolone (Kenalog)  Vitamin B12 ==================================================================== For clinical consultation, please call [phone]. ====================================================================       ROS  Constitutional: Denies any fever or chills Gastrointestinal: No reported hemesis, hematochezia, vomiting, or acute GI distress Musculoskeletal: left shoulder pain, left knee pain. Neurological: No reported episodes of acute onset apraxia, aphasia, dysarthria, agnosia, amnesia, paralysis, loss of coordination, or loss of consciousness  Medication Review  DULoxetine, albuterol, budesonide-formoterol, cetirizine, cyanocobalamin, furosemide, gabapentin, ibuprofen, lisinopril-hydrochlorothiazide, montelukast, omeprazole, traMADol, and triamcinolone  History Review  Allergy: Ms. Cabo is allergic to meperidine hcl, morphine, ondansetron, onion, sumatriptan, butorphanol, promethazine, coconut flavor, demerol [meperidine], morphine and codeine, phenergan [promethazine hcl], and zofran [ondansetron hcl]. Drug: Ms. Sharp  reports no history of drug use. Alcohol:  reports current alcohol use. Tobacco:  reports that she has been smoking cigarettes. She has a 12.50 pack-year smoking history. She has never used smokeless tobacco. Social: Ms. Lisboa  reports that she has been smoking cigarettes. She has a 12.50 pack-year smoking history. She has never used smokeless tobacco. She reports current alcohol use. She reports that she does not  use drugs. Medical:  has a past medical history of Anemia, Anxiety, Arthritis, Bradycardia, Cancer (HCC) (1990), Fibromyalgia, History of hiatal hernia, History of kidney stones, Hypertension, and Neuropathy. Surgical: Ms. Merkin  has a past surgical history that includes Cholecystectomy; Appendectomy; Replacement total knee; Abdominal hysterectomy; Joint replacement; Breast cyst aspiration (Left); Colonoscopy (2018); cyst removed; and Lipoma excision (Right, 10/09/2018). Family: family history includes Asthma in her mother; Breast cancer (age of onset: 6) in her cousin; Cancer in her father; Diabetes in her mother; Heart failure in her mother.  Laboratory Chemistry Profile   Renal Lab Results  Component Value Date   BUN 8 12/31/2018   CREATININE 0.71 12/31/2018   GFRAA >60 12/31/2018   GFRNONAA >60 12/31/2018     Hepatic Lab Results  Component Value Date   AST 16 12/30/2018   ALT 14 12/30/2018   ALBUMIN 4.4 12/30/2018   ALKPHOS 46 12/30/2018   LIPASE 24 12/30/2018     Electrolytes Lab Results  Component Value Date   NA 138 12/31/2018   K 3.5 12/31/2018   CL 104 12/31/2018   CALCIUM 8.6 (L) 12/31/2018   MG 2.1 03/06/2013     Bone No results found for: "VD25OH", "VD125OH2TOT", "NF6213YQ6", "VH8469GE9", "25OHVITD1", "25OHVITD2", "25OHVITD3", "TESTOFREE", "TESTOSTERONE"   Inflammation (CRP:  Acute Phase) (ESR: Chronic Phase) Lab Results  Component Value Date   LATICACIDVEN 1.5 12/30/2018       Note: Above Lab results reviewed.  Recent Imaging Review  DG Chest 2 View CLINICAL DATA:  Persistent cough  EXAM: CHEST - 2 VIEW  COMPARISON:  03/05/2013  FINDINGS: The heart size and mediastinal contours are within normal limits. Both lungs are clear. The visualized skeletal structures are unremarkable.  IMPRESSION: No active cardiopulmonary disease.  Electronically Signed   By: Jasmine Pang M.D.   On: 10/26/2022 15:52  Note: Reviewed        Physical Exam   General appearance: Well nourished, well developed, and well hydrated. In no apparent acute distress Mental status: Alert, oriented x 3 (person, place, & time)       Respiratory: No evidence of acute respiratory distress Eyes: PERLA Vitals: BP 110/79 (BP Location: Left Arm, Patient Position: Sitting, Cuff Size: Large)   Pulse 72   Temp 97.9 F (36.6 C) (Temporal)   Resp 16   Ht 5' 6.5" (1.689 m)   Wt 206 lb 6.4 oz (93.6 kg)   SpO2 99%   BMI 32.81 kg/m  BMI: Estimated body mass index is 32.81 kg/m as calculated from the following:   Height as of this encounter: 5' 6.5" (1.689 m).   Weight as of this encounter: 206 lb 6.4 oz (93.6 kg). Ideal: Ideal body weight: 60.5 kg (133 lb 4.3 oz) Adjusted ideal body weight: 73.7 kg (162 lb 8.3 oz)  Upper Extremity (UE) Exam    Side: Right upper extremity  Side: Left upper extremity  Skin & Extremity Inspection: Skin color, temperature, and hair growth are WNL. No peripheral edema or cyanosis. No masses, redness, swelling, asymmetry, or associated skin lesions. No contractures.  Skin & Extremity Inspection: Skin color, temperature, and hair growth are WNL. No peripheral edema or cyanosis. No masses, redness, swelling, asymmetry, or associated skin lesions. No contractures.  Functional ROM: Unrestricted ROM          Functional ROM: Pain restricted ROM for shoulder  Muscle Tone/Strength: Functionally intact. No obvious neuro-muscular anomalies detected.  Muscle Tone/Strength: Functionally intact. No obvious neuro-muscular anomalies detected.  Sensory (Neurological): Unimpaired          Sensory (Neurological): Musculoskeletal pain pattern          Palpation: No palpable anomalies              Palpation: No palpable anomalies              Provocative Test(s):  Phalen's test: deferred Tinel's test: deferred Apley's scratch test (touch opposite shoulder):  Action 1 (Across chest): deferred Action 2 (Overhead): deferred Action 3 (LB reach): deferred    Provocative Test(s):  Phalen's test: deferred Tinel's test: deferred Apley's scratch test (touch opposite shoulder):  Action 1 (Across chest): Decreased ROM Action 2 (Overhead): Decreased ROM Action 3 (LB reach): Decreased ROM    Left knee pain, worse with weightbearing.  Arthropathic pain pattern  Assessment   Status Diagnosis  Controlled Controlled Controlled 1. Chronic pain syndrome   2. Fibromyalgia   3. Primary osteoarthritis of left knee   4. Chronic pain of left knee   5. Status post total right knee replacement   6. Traumatic complete tear of left rotator cuff, sequela         Plan of Care  Ms. Kemi Hanley has a current medication list which includes the following long-term medication(s): budesonide-formoterol, cetirizine, duloxetine, furosemide, montelukast,  omeprazole, and gabapentin.  Pharmacotherapy (Medications Ordered): Meds ordered this encounter  Medications   traMADol (ULTRAM) 50 MG tablet    Sig: Take 1 tablet (50 mg total) by mouth every 6 (six) hours as needed.    Dispense:  120 tablet    Refill:  3   gabapentin (NEURONTIN) 300 MG capsule    Sig: Take 1 capsule (300 mg total) by mouth 2 (two) times daily.    Dispense:  60 capsule    Refill:  11    continue Cymbalta as prescribed. Follow-up as needed for left knee intra-articular steroid injection. Orders Placed This Encounter  Procedures   KNEE INJECTION    Local Anesthetic & Steroid injection.    Standing Status:   Standing    Number of Occurrences:   2    Standing Expiration Date:   04/23/2024    Scheduling Instructions:     Side: LEFT     Sedation: None     Timeframe: PRN    Order Specific Question:   Where will this procedure be performed?    Answer:   [facility]   Tox[employer] Select 13 (MW), Urine    Volume: 30 ml(s). Minimum 3 ml of urine is needed. Document temperature of fresh sample. Indications: Long term (current) use of opiate analgesic (Z61.096)    Order Specific  Question:   Release to patient    Answer:   Immediate     Follow-up plan:   Return for patient will call to schedule F2F appt prn.      Recent Visits No visits were found meeting these conditions. Showing recent visits within past 90 days and meeting all other requirements Today's Visits Date Type Provider Dept  04/24/23 Office Visit Edward Jolly, MD [facility]  Showing today's visits and meeting all other requirements Future Appointments No visits were found meeting these conditions. Showing future appointments within next 90 days and meeting all other requirements  I discussed the assessment and treatment plan with the patient. The patient was provided an opportunity to ask questions and all were answered. The patient agreed with the plan and demonstrated an understanding of the instructions.  Patient advised to call back or seek an in-person evaluation if the symptoms or condition worsens.  Duration of encounter: 30 minutes.  Note by: Edward Jolly, MD Date: 04/24/2023; Time: 3:06 PM

## 2023-04-24 NOTE — Progress Notes (Signed)
Nursing Pain Medication Assessment:  Safety precautions to be maintained throughout the outpatient stay will include: orient to surroundings, keep bed in low position, maintain call bell within reach at all times, provide assistance with transfer out of bed and ambulation.  Medication Inspection Compliance: Pill count conducted under aseptic conditions, in front of the patient. Neither the pills nor the bottle was removed from the patient's sight at any time. Once count was completed pills were immediately returned to the patient in their original bottle.  Medication: Tramadol (Ultram) Pill/Patch Count:  53 of 120 pills remain Pill/Patch Appearance: Markings consistent with prescribed medication Bottle Appearance: Standard pharmacy container. Clearly labeled. Filled Date: 05 / 12 / 2024 Last Medication intake:  Today

## 2023-04-26 LAB — TOXASSURE SELECT 13 (MW), URINE

## 2023-06-09 ENCOUNTER — Ambulatory Visit
Admission: EM | Admit: 2023-06-09 | Discharge: 2023-06-09 | Disposition: A | Discharge status: Home / Self Care | Payer: 59 | Source: Home / Self Care

## 2023-06-09 DIAGNOSIS — M62838 Other muscle spasm: Secondary | ICD-10-CM

## 2023-06-09 MED ORDER — KETOROLAC TROMETHAMINE 60 MG/2ML IM SOLN
60.0000 mg | Freq: Once | INTRAMUSCULAR | Status: AC
  Administered 2023-06-09: 60 mg via INTRAMUSCULAR

## 2023-06-09 MED ORDER — CYCLOBENZAPRINE HCL 10 MG PO TABS: 10.0000 mg | ORAL_TABLET | Freq: Two times a day (BID) | ORAL | 0 refills | Status: DC | PRN

## 2023-06-09 NOTE — ED Triage Notes (Signed)
Pt c/o neck pain and inability to move her neck.  Pt states that when she moves her neck she gets shooting pains going down her back.  Pt states that she spent 2 hours last night massaging her neck and it did not help.   Pt denies any new activities or behaviors that could have injured her neck.   Pt has psoriatic arthritis and was recently taken off all of her medication.

## 2023-06-09 NOTE — Discharge Instructions (Signed)
You were seen for neck pain and are being treated for the same.   -You received 60 mg of Toradol today. -Do not take any NSAIDs for the rest of the day.  You can take ibuprofen right before bed tonight if needed. -Use of Flexeril as needed. -Continue using heat to the area.  Take care, Dr. Sharlet Salina, NP-c

## 2023-06-09 NOTE — ED Triage Notes (Signed)
Pt was on Humira for her arthritis but stopped last year due to a shoulder surgery. Pt states that her provider has not put her back onHumira as of yet

## 2023-06-09 NOTE — ED Provider Notes (Signed)
[facility] - [place], [place]   Name: Amy Logan DOB: 07-14-1971 MRN: [id number] CSN: [id number] PCP: Myrene Buddy, NP  Arrival date and time:  06/09/23 [phone]  Chief Complaint:  Neck Pain   NOTE: Prior to seeing the patient today, I have reviewed the triage nursing documentation and vital signs. Clinical staff has updated patient's PMH/PSHx, current medication list, and drug allergies/intolerances to ensure comprehensive history available to assist in medical decision making.   History:   HPI: Amy Logan is a 52 y.o. female who presents today with complaints of bilateral leg pain.  This pain has been going on for 48 days.  She has tried massage, heat and ice with no relief.  She has also taken 800 mg of ibuprofen a few times with no relief.  She states this has occurred before and her stiffness and pain was relieved with a shot of IM Toradol and a prescription for Flexeril.  Of note, patient is an active stage of the psoriatic arthritis but her medications were stopped after her recent shoulder surgery.  She does have a prescription for gabapentin for neuropathy but has not taken it for 4 months.  She endorses headache with this pain, but denies blurred vision and numbness and tingling down arms and fingers.  Last dose of ibuprofen was 800 mg at 4 AM.   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  EPIDERMAL INCLUSION CYST   REPLACEMENT TOTAL KNEE     Right knee     Family History  Problem Relation Age of Onset   Diabetes Mother    Heart failure Mother    Asthma Mother    Cancer Father    Breast cancer Cousin 38       mat cousin    Social History   Tobacco Use   Smoking status: Every Day    Current packs/day: 0.50    Average packs/day: 0.5 packs/day for 25.0 years (12.5 ttl pk-yrs)    Types: Cigarettes   Smokeless tobacco: Never  Vaping Use   Vaping status: Never Used  Substance Use Topics   Alcohol use: Not Currently    Comment: social   Drug use: No    Patient Active Problem List   Diagnosis Date Noted   Stiffness of shoulder joint, left 02/01/2022   Traumatic complete tear of left rotator cuff 01/19/2022   Chronic pain syndrome 01/19/2022   Tear of left rotator cuff 01/15/2022   Arthritis of left acromioclavicular joint 01/03/2022   Tendonitis of shoulder, left 01/03/2022   Muscle strain of left shoulder 01/03/2022   Shoulder pain, left 01/03/2022   Arthritis of left shoulder region 01/03/2022   Primary osteoarthritis of left knee 03/08/2021   Fibromyalgia 08/18/2020   Chronic  pain of left knee 08/18/2020   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   Psoriatic arthritis (HCC) 08/18/2020   Psoriasis 04/15/2020   Encounter for long-term (current) use of high-risk medication 04/15/2020   Increased band cell count 03/20/2019   Chronic fatigue 03/20/2019   RLQ abdominal pain 12/30/2018   Leukocytosis 12/30/2018   Anxiety and depression 10/24/2018   Arthritis 10/24/2018   Chronic, continuous use of opioids 10/24/2018   Chronic insomnia 10/24/2018   Hyperlipidemia 10/24/2018   Hypertension 10/24/2018   Neuropathy 10/24/2018   Obesity (BMI 30-39.9) 10/24/2018   H/O excision of epidermal inclusion cyst 10/24/2018   SIRS (systemic inflammatory response syndrome) (HCC) 10/14/2018   Epidermal inclusion cyst    Mass of soft tissue of abdomen 09/30/2018   B12 deficiency 11/20/2016   Vitamin D insufficiency  11/20/2016   Hx of normocytic normochromic anemia 12/20/2015   Incomplete tear of right rotator cuff 11/21/2015    Home Medications:    Current Meds  Medication Sig   albuterol (VENTOLIN HFA) 108 (90 Base) MCG/ACT inhaler Inhale into the lungs.   budesonide-formoterol (SYMBICORT) 160-4.5 MCG/ACT inhaler Inhale 2 puffs into the lungs 2 (two) times daily.   cetirizine (ZYRTEC) 10 MG tablet Take 10 mg by mouth daily as needed for allergies.   cyanocobalamin (VITAMIN B12) 1000 MCG/ML injection Inject 1,000 mcg into the muscle every 30 (thirty) days.   furosemide (LASIX) 20 MG tablet Take 20 mg by mouth daily as needed.   gabapentin (NEURONTIN) 300 MG capsule Take 1 capsule (300 mg total) by mouth 2 (two) times daily.   ibuprofen (ADVIL) 800 MG tablet Take 800 mg by mouth 3 (three) times daily.   lisinopril-hydrochlorothiazide (PRINZIDE,ZESTORETIC) 20-25 MG tablet Take 1 tablet by mouth every morning.   montelukast (SINGULAIR) 10 MG tablet Take 10 mg by mouth daily as needed.   omeprazole (PRILOSEC) 40 MG capsule Take 1 capsule by mouth daily.   traMADol (ULTRAM) 50 MG tablet Take 1 tablet (50 mg total) by mouth every 6 (six) hours as needed.   triamcinolone (KENALOG) 0.025 % cream Apply 1 application topically 2 (two) times daily.    Allergies:   Meperidine hcl, Morphine, Ondansetron, Onion, Sumatriptan, Butorphanol, Promethazine, Coconut flavor, Demerol [meperidine], Morphine and codeine, Phenergan [promethazine hcl], and Zofran [ondansetron hcl]  Review of Systems (ROS): Review of Systems  Constitutional:  Negative for chills, diaphoresis, fatigue and fever.  Musculoskeletal:  Positive for myalgias, neck pain and neck stiffness.  Neurological:  Positive for headaches. Negative for dizziness, tremors, weakness, light-headedness and numbness.     Vital Signs: Today's Vitals   06/09/23 1014 06/09/23 1016  BP:  (!) 161/102  Pulse:  60  Temp:  98.4 F (36.9 C)  TempSrc:  Oral  SpO2:   98%  Weight: 187 lb (84.8 kg)   Height: 5\' 6"  (1.676 m)   PainSc: 8      Physical Exam: Physical Exam Vitals and nursing note reviewed.  Constitutional:      Appearance: Normal appearance.  Cardiovascular:     Rate and Rhythm: Normal rate and regular rhythm.     Pulses: Normal pulses.     Heart sounds: Normal heart sounds.  Pulmonary:     Effort: Pulmonary effort is normal.     Breath sounds: Normal breath sounds.  Musculoskeletal:     Right shoulder: No tenderness. Decreased range of motion.     Left shoulder: No tenderness. Decreased range of motion.     Cervical back:  No signs of trauma, rigidity or crepitus. No spinous process tenderness. Decreased range of motion.     Comments: Decreased range of motion of shoulders and neck  Skin:    General: Skin is warm and dry.  Neurological:     General: No focal deficit present.     Mental Status: She is alert and oriented to person, place, and time.  Psychiatric:        Mood and Affect: Mood normal.        Behavior: Behavior normal.      Urgent Care Treatments / Results:   LABS: PLEASE NOTE: all labs that were ordered this encounter are listed, however only abnormal results are displayed. Labs Reviewed - No data to display  EKG: -None  RADIOLOGY: No results found.  PROCEDURES: Procedures  MEDICATIONS RECEIVED THIS VISIT: Medications  ketorolac (TORADOL) injection 60 mg (has no administration in time range)    PERTINENT CLINICAL COURSE NOTES/UPDATES:   Initial Impression / Assessment and Plan / Urgent Care Course:  Pertinent labs & imaging results that were available during my care of the patient were personally reviewed by me and considered in my medical decision making (see lab/imaging section of note for values and interpretations).  Amy Logan is a 52 y.o. female who presents to [facility] today with complaints of neck pain, diagnosed with muscle strain, and treated as such with  the medication  given above and prescribed below.  NP and patient reviewed discharge instructions below during visit.   Patient is well appearing overall in clinic today. She does not appear to be in any acute distress. Presenting symptoms (see HPI) and exam as documented above.   I have reviewed the follow up and strict return precautions for any new or worsening symptoms. Patient is aware of symptoms that would be deemed urgent/emergent, and would thus require further evaluation either here or in the emergency department. At the time of discharge, she verbalized understanding and consent with the discharge plan as it was reviewed with her. All questions were fielded by provider and/or clinic staff prior to patient discharge.    Final Clinical Impressions / Urgent Care Diagnoses:   Final diagnoses:  Muscle spasms of neck    New Prescriptions:  [place] Controlled Substance Registry consulted? Not Applicable  Meds ordered this encounter  Medications   ketorolac (TORADOL) injection 60 mg   cyclobenzaprine (FLEXERIL) 10 MG tablet    Sig: Take 1 tablet (10 mg total) by mouth 2 (two) times daily as needed for muscle spasms.    Dispense:  14 tablet    Refill:  0      Discharge Instructions      You were seen for neck pain and are being treated for the same.   -You received 60 mg of Toradol today. -Do not take any NSAIDs for the rest of the day.  You can take ibuprofen right before bed tonight if needed. -Use of Flexeril as needed. -Continue using heat to the area.  Take care, Dr. Sharlet Salina, NP-c     Recommended Follow up Care:  Patient encouraged to follow up with the following provider within the specified time frame, or sooner as dictated by the severity of her symptoms. As always, she was instructed that for any urgent/emergent care needs, she should seek care either here or in the emergency department for more immediate evaluation.   Bailey Mech, DNP, NP-c   Bailey Mech, NP 06/09/23  1053

## 2023-06-30 ENCOUNTER — Emergency Department
Admission: EM | Admit: 2023-06-30 | Discharge: 2023-06-30 | Disposition: A | Discharge status: Home / Self Care | Payer: 59 | Attending: Emergency Medicine | Admitting: Emergency Medicine

## 2023-06-30 ENCOUNTER — Other Ambulatory Visit: Payer: Self-pay

## 2023-06-30 DIAGNOSIS — I10 Essential (primary) hypertension: Secondary | ICD-10-CM | POA: Diagnosis not present

## 2023-06-30 DIAGNOSIS — R1031 Right lower quadrant pain: Secondary | ICD-10-CM | POA: Diagnosis not present

## 2023-06-30 DIAGNOSIS — M2550 Pain in unspecified joint: Secondary | ICD-10-CM

## 2023-06-30 LAB — COMPREHENSIVE METABOLIC PANEL
ALT: 17 U/L (ref 0–44)
AST: 17 U/L (ref 15–41)
Albumin: 4.4 g/dL (ref 3.5–5.0)
Alkaline Phosphatase: 71 U/L (ref 38–126)
Anion gap: 10 (ref 5–15)
BUN: 17 mg/dL (ref 6–20)
CO2: 24 mmol/L (ref 22–32)
Calcium: 9.5 mg/dL (ref 8.9–10.3)
Chloride: 99 mmol/L (ref 98–111)
Creatinine, Ser: 0.85 mg/dL (ref 0.44–1.00)
GFR, Estimated: >60 mL/min (ref >60)
Glucose, Bld: 97 mg/dL (ref 70–99)
Potassium: 3.5 mmol/L (ref 3.5–5.1)
Sodium: 133 mmol/L — ABNORMAL LOW (ref 135–145)
Total Bilirubin: 0.8 mg/dL (ref 0.3–1.2)
Total Protein: 8.3 g/dL — ABNORMAL HIGH (ref 6.5–8.1)

## 2023-06-30 LAB — URINALYSIS, ROUTINE W REFLEX MICROSCOPIC
Bilirubin Urine: NEGATIVE
Glucose, UA: NEGATIVE mg/dL
Hgb urine dipstick: NEGATIVE
Ketones, ur: NEGATIVE mg/dL
Leukocytes,Ua: NEGATIVE
Nitrite: NEGATIVE
Protein, ur: NEGATIVE mg/dL
Specific Gravity, Urine: 1.021 (ref 1.005–1.030)
pH: 5 (ref 5.0–8.0)

## 2023-06-30 LAB — CBC
HCT: 40.8 % (ref 36.0–46.0)
Hemoglobin: 13.3 g/dL (ref 12.0–15.0)
MCH: 31.5 pg (ref 26.0–34.0)
MCHC: 32.6 g/dL (ref 30.0–36.0)
MCV: 96.7 fL (ref 80.0–100.0)
Platelets: 311 10*3/uL (ref 150–400)
RBC: 4.22 MIL/uL (ref 3.87–5.11)
RDW: 14.5 % (ref 11.5–15.5)
WBC: 18 10*3/uL — ABNORMAL HIGH (ref 4.0–10.5)
nRBC: 0 % (ref 0.0–0.2)

## 2023-06-30 LAB — LACTIC ACID, PLASMA: Lactic Acid, Venous: 0.9 mmol/L (ref 0.5–1.9)

## 2023-06-30 LAB — LIPASE, BLOOD: Lipase: 36 U/L (ref 11–51)

## 2023-06-30 MED ORDER — NAPROXEN 500 MG PO TABS: 500.0000 mg | ORAL_TABLET | Freq: Two times a day (BID) | ORAL | 0 refills | Status: AC

## 2023-06-30 MED ORDER — KETOROLAC TROMETHAMINE 15 MG/ML IJ SOLN
15.0000 mg | Freq: Once | INTRAMUSCULAR | Status: AC
  Administered 2023-06-30: 15 mg via INTRAMUSCULAR
  Filled 2023-06-30: qty 1

## 2023-06-30 NOTE — ED Triage Notes (Addendum)
Pt states her psoriatic arthritis is flaring up and c/o lower back pain and RLQ abdominal pain. Pt states last time she had similar s/s she had SIRS. Pt is AOX4, NAD noted. Pt has been off humera due to shoulder surgery but rx was never refilled after.

## 2023-06-30 NOTE — ED Provider Notes (Signed)
[facility] Provider Note    Event Date/Time   First MD Initiated Contact with Patient 06/30/23 1918     (approximate)   History   arthritis flare   HPI  Amy Logan is a 52 y.o. female with history of psoriatic arthritis, hypertension, chronic pain presenting to the emergency department for evaluation of joint pain.  Patient reports that last year she had shoulder surgery and was taken off of Humira prior to this.  She was never restarted, and has not yet followed up with her rheumatologist.  More recently, she has had some increased hot flashes with increasing pain in her joints.  She reports this feels similar to a psoriatic arthritis flare that she had a few years ago and is concerned that she could be developing an infection.  She denies fevers.  Does report some right lower quadrant pain intermittently over the last week that she thinks may be related to an ovarian cyst.  Has a history of a prior cholecystectomy and appendectomy.      Physical Exam   Triage Vital Signs: ED Triage Vitals [06/30/23 1727]  Encounter Vitals Group     BP 137/89     Systolic BP Percentile      Diastolic BP Percentile      Pulse Rate 87     Resp 18     Temp 98.5 F (36.9 C)     Temp Source Oral     SpO2 98 %     Weight      Height      Head Circumference      Peak Flow      Pain Score 4     Pain Loc      Pain Education      Exclude from Growth Chart     Most recent vital signs: Vitals:   06/30/23 1727  BP: 137/89  Pulse: 87  Resp: 18  Temp: 98.5 F (36.9 C)  SpO2: 98%     General: Awake, interactive  CV:  Regular rate, good peripheral perfusion.  Resp:  Lungs clear, unlabored respirations.  Abd:  Soft, nondistended, mild tenderness to palpation in the right lower abdomen, remainder of abdomen nontender Neuro:  Symmetric facial movement, fluid speech   ED Results / Procedures / Treatments   Labs (all labs ordered are listed, but only abnormal  results are displayed) Labs Reviewed  COMPREHENSIVE METABOLIC PANEL - Abnormal; Notable for the following components:      Result Value   Sodium 133 (*)    Total Protein 8.3 (*)    All other components within normal limits  CBC - Abnormal; Notable for the following components:   WBC 18.0 (*)    All other components within normal limits  URINALYSIS, ROUTINE W REFLEX MICROSCOPIC - Abnormal; Notable for the following components:   Color, Urine YELLOW (*)    APPearance HAZY (*)    All other components within normal limits  LIPASE, BLOOD  LACTIC ACID, PLASMA     EKG EKG independently reviewed interpreted by myself (ER attending) demonstrates:    RADIOLOGY Imaging independently reviewed and interpreted by myself demonstrates:    PROCEDURES:  Critical Care performed: No  Procedures   MEDICATIONS ORDERED IN ED: Medications  ketorolac (TORADOL) 15 MG/ML injection 15 mg (15 mg Intramuscular Given 06/30/23 1950)     IMPRESSION / MDM / ASSESSMENT AND PLAN / ED COURSE  I reviewed the triage vital signs and the nursing notes.  Differential diagnosis includes, but is not limited to, psoriatic arthritis flare, anemia, electrolyte abnormality, flare of chronic pain  Patient's presentation is most consistent with exacerbation of chronic illness.  52 year old female presenting to the emergency department with worsening joint pain consistent with prior psoriatic arthritis flares.  Here, vital signs are stable.  She does have a leukocytosis with WBC of 18, but does appear to have a chronic leukocytosis, perhaps slightly increased from prior.  She otherwise is without evidence of infection.  She has some right lower quadrant pain, but has a prior appendectomy.  Discussed ultrasound for evaluation of ovarian pathology, but patient reports her pain is mild and she would rather follow-up with OB/GYN which I do think is reasonable given her clinical history of symptoms over a week and only mild  discomfort.  Patient is primarily concerned about how to manage her inflammatory state in the setting of her psoriatic arthritis.  We discussed the importance of following up with her rheumatologist for further evaluation, but do think it is reasonable to trial a course of anti-inflammatories in the interim.  She was given a dose of Toradol here and discharged with a prescription for naproxen.  Strict return precautions were provided.  She was discharged stable condition.     FINAL CLINICAL IMPRESSION(S) / ED DIAGNOSES   Final diagnoses:  Arthralgia, unspecified joint     Rx / DC Orders   ED Discharge Orders          Ordered    naproxen (NAPROSYN) 500 MG tablet  2 times daily with meals        06/30/23 2002             Note:  This document was prepared using Dragon voice recognition software and may include unintentional dictation errors.   Trinna Post, MD 06/30/23 2002

## 2023-06-30 NOTE — Discharge Instructions (Addendum)
You were seen in the ER today for evaluation of your worsening joint pain in the setting of your psoriatic arthritis.  Please keep your scheduled follow-up with your primary care doctor, but please also contact your rheumatologist to arrange follow-up.  Have sent a prescription for naproxen to your pharmacy.  Please take this as directed.  Do not take other NSAIDs with this.  Return to the ER for new or worsening symptoms.

## 2023-10-08 ENCOUNTER — Other Ambulatory Visit: Payer: Self-pay | Admitting: Student in an Organized Health Care Education/Training Program

## 2023-10-08 DIAGNOSIS — S46012S Strain of muscle(s) and tendon(s) of the rotator cuff of left shoulder, sequela: Secondary | ICD-10-CM

## 2023-10-08 DIAGNOSIS — G894 Chronic pain syndrome: Secondary | ICD-10-CM

## 2023-10-08 DIAGNOSIS — M797 Fibromyalgia: Secondary | ICD-10-CM

## 2023-10-08 DIAGNOSIS — G8929 Other chronic pain: Secondary | ICD-10-CM

## 2023-10-08 DIAGNOSIS — Z96651 Presence of right artificial knee joint: Secondary | ICD-10-CM

## 2023-10-08 DIAGNOSIS — M1712 Unilateral primary osteoarthritis, left knee: Secondary | ICD-10-CM

## 2023-10-15 ENCOUNTER — Encounter: Payer: Self-pay | Admitting: Student in an Organized Health Care Education/Training Program

## 2023-10-15 ENCOUNTER — Ambulatory Visit
Payer: 59 | Attending: Student in an Organized Health Care Education/Training Program | Admitting: Student in an Organized Health Care Education/Training Program

## 2023-10-15 DIAGNOSIS — G894 Chronic pain syndrome: Secondary | ICD-10-CM | POA: Insufficient documentation

## 2023-10-15 DIAGNOSIS — Z96651 Presence of right artificial knee joint: Secondary | ICD-10-CM | POA: Insufficient documentation

## 2023-10-15 DIAGNOSIS — M797 Fibromyalgia: Secondary | ICD-10-CM | POA: Insufficient documentation

## 2023-10-15 DIAGNOSIS — M1712 Unilateral primary osteoarthritis, left knee: Secondary | ICD-10-CM | POA: Insufficient documentation

## 2023-10-15 DIAGNOSIS — X58XXXS Exposure to other specified factors, sequela: Secondary | ICD-10-CM | POA: Diagnosis not present

## 2023-10-15 DIAGNOSIS — S46012S Strain of muscle(s) and tendon(s) of the rotator cuff of left shoulder, sequela: Secondary | ICD-10-CM | POA: Diagnosis not present

## 2023-10-15 DIAGNOSIS — G8929 Other chronic pain: Secondary | ICD-10-CM

## 2023-10-15 MED ORDER — LIDOCAINE HCL 2 % IJ SOLN
20.0000 mL | Freq: Once | INTRAMUSCULAR | Status: AC
  Administered 2023-10-15: 400 mg
  Filled 2023-10-15: qty 20

## 2023-10-15 MED ORDER — TRAMADOL HCL 50 MG PO TABS
50.0000 mg | ORAL_TABLET | Freq: Four times a day (QID) | ORAL | 3 refills | Status: DC | PRN
Start: 2023-10-15 — End: 2024-03-19

## 2023-10-15 MED ORDER — METHYLPREDNISOLONE ACETATE 40 MG/ML IJ SUSP
40.0000 mg | Freq: Once | INTRAMUSCULAR | Status: AC
  Administered 2023-10-15: 40 mg via INTRA_ARTICULAR
  Filled 2023-10-15: qty 1

## 2023-10-15 MED ORDER — GABAPENTIN 300 MG PO CAPS: ORAL_CAPSULE | ORAL | 5 refills | Status: DC

## 2023-10-15 MED ORDER — ROPIVACAINE HCL 2 MG/ML IJ SOLN
4.0000 mL | Freq: Once | INTRAMUSCULAR | Status: AC
  Administered 2023-10-15: 20 mL via INTRA_ARTICULAR
  Filled 2023-10-15: qty 20

## 2023-10-15 NOTE — Patient Instructions (Addendum)
______________________________________________________________________    Post-Procedure Discharge Instructions  Instructions: Apply ice:  Purpose: This will minimize any swelling and discomfort after procedure.  When: Day of procedure, as soon as you get home. How: Fill a plastic sandwich bag with crushed ice. Cover it with a small towel and apply to injection site. How long: (15 min on, 15 min off) Apply for 15 minutes then remove x 15 minutes.  Repeat sequence on day of procedure, until you go to bed. Apply heat:  Purpose: To treat any soreness and discomfort from the procedure. When: Starting the next day after the procedure. How: Apply heat to procedure site starting the day following the procedure. How long: May continue to repeat daily, until discomfort goes away. Food intake: Start with clear liquids (like water) and advance to regular food, as tolerated.  Physical activities: Keep activities to a minimum for the first 8 hours after the procedure. After that, then as tolerated. Driving: If you have received any sedation, be responsible and do not drive. You are not allowed to drive for 24 hours after having sedation. Blood thinner: (Applies only to those taking blood thinners) You may restart your blood thinner 6 hours after your procedure. Insulin: (Applies only to Diabetic patients taking insulin) As soon as you can eat, you may resume your normal dosing schedule. Infection prevention: Keep procedure site clean and dry. Shower daily and clean area with soap and water. Post-procedure Pain Diary: Extremely important that this be done correctly and accurately. Recorded information will be used to determine the next step in treatment. For the purpose of accuracy, follow these rules: Evaluate only the area treated. Do not report or include pain from an untreated area. For the purpose of this evaluation, ignore all other areas of pain, except for the treated area. After your procedure,  avoid taking a long nap and attempting to complete the pain diary after you wake up. Instead, set your alarm clock to go off every hour, on the hour, for the initial 8 hours after the procedure. Document the duration of the numbing medicine, and the relief you are getting from it. Do not go to sleep and attempt to complete it later. It will not be accurate. If you received sedation, it is likely that you were given a medication that may cause amnesia. Because of this, completing the diary at a later time may cause the information to be inaccurate. This information is needed to plan your care. Follow-up appointment: Keep your post-procedure follow-up evaluation appointment after the procedure (usually 2 weeks for most procedures, 6 weeks for radiofrequencies). DO NOT FORGET to bring you pain diary with you.   Expect: (What should I expect to see with my procedure?) From numbing medicine (AKA: Local Anesthetics): Numbness or decrease in pain. You may also experience some weakness, which if present, could last for the duration of the local anesthetic. Onset: Full effect within 15 minutes of injected. Duration: It will depend on the type of local anesthetic used. On the average, 1 to 8 hours.  From steroids (Applies only if steroids were used): Decrease in swelling or inflammation. Once inflammation is improved, relief of the pain will follow. Onset of benefits: Depends on the amount of swelling present. The more swelling, the longer it will take for the benefits to be seen. In some cases, up to 10 days. Duration: Steroids will stay in the system x 2 weeks. Duration of benefits will depend on multiple posibilities including persistent irritating factors. Side-effects: If  present, they may typically last 2 weeks (the duration of the steroids). Frequent: Cramps (if they occur, drink Gatorade and take over-the-counter Magnesium 450-500 mg once to twice a day); water retention with temporary weight gain;  increases in blood sugar; decreased immune system response; increased appetite. Occasional: Facial flushing (red, warm cheeks); mood swings; menstrual changes. Uncommon: Long-term decrease or suppression of natural hormones; bone thinning. (These are more common with higher doses or more frequent use. This is why we prefer that our patients avoid having any injection therapies in other practices.)  Very Rare: Severe mood changes; psychosis; aseptic necrosis. From procedure: Some discomfort is to be expected once the numbing medicine wears off. This should be minimal if ice and heat are applied as instructed.  Call if: (When should I call?) You experience numbness and weakness that gets worse with time, as opposed to wearing off. New onset bowel or bladder incontinence. (Applies only to procedures done in the spine)  Emergency Numbers: Durning business hours (Monday - Thursday, 8:00 AM - 4:00 PM) (Friday, 9:00 AM - 12:00 Noon): [phone] After hours: [phone] NOTE: If you are having a problem and are unable connect with, or to talk to a provider, then go to your nearest urgent care or emergency department. If the problem is serious and urgent, please call 911. ______________________________________________________________________     Take Tylenol [phone] mg at night. Increase Gabapentin to 600 mg at night. Prescriptions for Gabapentin and Tramadol sent to pharmacy.

## 2023-10-15 NOTE — Progress Notes (Signed)
PROVIDER NOTE: Information contained herein reflects review and annotations entered in association with encounter. Interpretation of such information and data should be left to medically-trained personnel. Information provided to patient can be located elsewhere in the medical record under "Patient Instructions". Document created using STT-dictation technology, any transcriptional errors that may result from process are unintentional.    Patient: Amy Logan  Service Category: Procedure  Provider: Edward Jolly, MD  DOB: 10/02/1971  DOS: 10/15/2023  Location: [facility]  MRN: [id number]  Setting: Ambulatory - outpatient  Referring Provider: Edward Jolly, MD  Type: Established Patient  Specialty: Interventional Pain Management  PCP: Myrene Buddy, NP   Primary Reason for Visit: Interventional Pain Management Treatment and Medication Management E/M Visit CC: Knee Pain (Left knee pain)  Procedure:          Anesthesia, Analgesia, Anxiolysis:  Type: Therapeutic Intra-Articular Local anesthetic and steroid Knee Injection  Region: Medial infrapatellar Knee Region Level: Knee Joint Laterality: Left knee  Type: Local Anesthesia Indication(s): Analgesia         Local Anesthetic: Lidocaine 1-2% Route: Infiltration (Kaufman/IM) IV Access: Declined Sedation: Declined   Position: Sitting   Indications: 1. Primary osteoarthritis of left knee   2. Chronic pain of left knee   3. Chronic pain syndrome   4. Fibromyalgia   5. Status post total right knee replacement   6. Traumatic complete tear of left rotator cuff, sequela    Pain Score: Pre-procedure: 6 /10 Post-procedure: 6 /10   Pre-op Assessment:  Amy Logan is a 52 y.o. (year old), female patient, seen today for interventional treatment. She  has a past surgical history that includes Cholecystectomy; Appendectomy; Replacement total knee; Abdominal hysterectomy; Joint replacement; Breast cyst aspiration (Left); Colonoscopy  (2018); cyst removed; and Lipoma excision (Right, 10/09/2018). Amy Logan has a current medication list which includes the following prescription(s): cetirizine, cyanocobalamin, furosemide, ibuprofen, lisinopril-hydrochlorothiazide, montelukast, omeprazole, triamcinolone, albuterol, budesonide-formoterol, cyclobenzaprine, duloxetine, gabapentin, and tramadol. Her primarily concern today is the Knee Pain (Left knee pain)  Initial Vital Signs:  Pulse/HCG Rate: 70  Temp: (!) 97.4 F (36.3 C) Resp: 16 BP: (!) 164/106 SpO2: 100 %  BMI: Estimated body mass index is 28.89 kg/m as calculated from the following:   Height as of this encounter: 5\' 6"  (1.676 m).   Weight as of this encounter: 179 lb (81.2 kg).  Risk Assessment: Allergies: Reviewed. She is allergic to morphine, ondansetron, onion, sumatriptan, butorphanol, promethazine, coconut flavor, demerol [meperidine], phenergan [promethazine hcl], and zofran [ondansetron hcl].  Allergy Precautions: None required Coagulopathies: Reviewed. None identified.  Blood-thinner therapy: None at this time Active Infection(s): Reviewed. None identified. Amy Logan is afebrile  Site Confirmation: Amy Logan was asked to confirm the procedure and laterality before marking the site Procedure checklist: Completed Consent: Before the procedure and under the influence of no sedative(s), amnesic(s), or anxiolytics, the patient was informed of the treatment options, risks and possible complications. To fulfill our ethical and legal obligations, as recommended by the American Medical Association's Code of Ethics, I have informed the patient of my clinical impression; the nature and purpose of the treatment or procedure; the risks, benefits, and possible complications of the intervention; the alternatives, including doing nothing; the risk(s) and benefit(s) of the alternative treatment(s) or procedure(s); and the risk(s) and benefit(s) of doing nothing. The patient was  provided information about the general risks and possible complications associated with the procedure. These may include, but are not limited to: failure to achieve desired goals,  infection, bleeding, organ or nerve damage, allergic reactions, paralysis, and death. In addition, the patient was informed of those risks and complications associated to the procedure, such as failure to decrease pain; infection; bleeding; organ or nerve damage with subsequent damage to sensory, motor, and/or autonomic systems, resulting in permanent pain, numbness, and/or weakness of one or several areas of the body; allergic reactions; (i.e.: anaphylactic reaction); and/or death. Furthermore, the patient was informed of those risks and complications associated with the medications. These include, but are not limited to: allergic reactions (i.e.: anaphylactic or anaphylactoid reaction(s)); adrenal axis suppression; blood sugar elevation that in diabetics may result in ketoacidosis or comma; water retention that in patients with history of congestive heart failure may result in shortness of breath, pulmonary edema, and decompensation with resultant heart failure; weight gain; swelling or edema; medication-induced neural toxicity; particulate matter embolism and blood vessel occlusion with resultant organ, and/or nervous system infarction; and/or aseptic necrosis of one or more joints. Finally, the patient was informed that Medicine is not an exact science; therefore, there is also the possibility of unforeseen or unpredictable risks and/or possible complications that may result in a catastrophic outcome. The patient indicated having understood very clearly. We have given the patient no guarantees and we have made no promises. Enough time was given to the patient to ask questions, all of which were answered to the patient's satisfaction. Amy Logan has indicated that she wanted to continue with the procedure. Attestation: I, the  ordering provider, attest that I have discussed with the patient the benefits, risks, side-effects, alternatives, likelihood of achieving goals, and potential problems during recovery for the procedure that I have provided informed consent. Date  Time: 10/15/2023  1:15 PM  Pre-Procedure Preparation:  Monitoring: As per clinic protocol. Respiration, ETCO2, SpO2, BP, heart rate and rhythm monitor placed and checked for adequate function Safety Precautions: Patient was assessed for positional comfort and pressure points before starting the procedure. Time-out: I initiated and conducted the "Time-out" before starting the procedure, as per protocol. The patient was asked to participate by confirming the accuracy of the "Time Out" information. Verification of the correct person, site, and procedure were performed and confirmed by me, the nursing staff, and the patient. "Time-out" conducted as per Joint Commission's Universal Protocol (UP.01.01.01). Time: 1342  Description of Procedure:          Target Area: Knee Joint Approach: Just above the Medial tibial plateau, lateral to the infrapatellar tendon. Area Prepped: Entire knee area, from the mid-thigh to the mid-shin. DuraPrep (Iodine Povacrylex [0.7% available iodine] and Isopropyl Alcohol, 74% w/w) Safety Precautions: Aspiration looking for blood return was conducted prior to all injections. At no point did we inject any substances, as a needle was being advanced. No attempts were made at seeking any paresthesias. Safe injection practices and needle disposal techniques used. Medications properly checked for expiration dates. SDV (single dose vial) medications used. Description of the Procedure: Protocol guidelines were followed. The patient was placed in position over the fluoroscopy table. The target area was identified and the area prepped in the usual manner. Skin & deeper tissues infiltrated with local anesthetic. Appropriate amount of time allowed  to pass for local anesthetics to take effect. The procedure needles were then advanced to the target area. Proper needle placement secured. Negative aspiration confirmed. Solution injected in intermittent fashion, asking for systemic symptoms every 0.5cc of injectate. The needles were then removed and the area cleansed, making sure to leave some of the prepping  solution back to take advantage of its long term bactericidal properties. Vitals:   10/15/23 1323 10/15/23 1327  BP: (!) 164/106 (!) 163/98  Pulse: 70   Resp: 16   Temp: (!) 97.4 F (36.3 C)   TempSrc: Temporal   SpO2: 100%   Weight: 179 lb (81.2 kg)   Height: 5\' 6"  (1.676 m)      Start Time: 1342 hrs. End Time: 1345 hrs. Materials:  Needle(s) Type: Regular needle Gauge: 25G Length: 1.5-in Medication(s): Please see orders for medications and dosing details.  Post-operative Assessment:  Post-procedure Vital Signs:  Pulse/HCG Rate: 70  Temp: (!) 97.4 F (36.3 C) Resp: 16 BP: (!) 163/98 SpO2: 100 %  EBL: None  Complications: No immediate post-treatment complications observed by team, or reported by patient.  Note: The patient tolerated the entire procedure well. A repeat set of vitals were taken after the procedure and the patient was kept under observation following institutional policy, for this type of procedure. Post-procedural neurological assessment was performed, showing return to baseline, prior to discharge. The patient was provided with post-procedure discharge instructions, including a section on how to identify potential problems. Should any problems arise concerning this procedure, the patient was given instructions to immediately contact us, at any time, without hesitation. In any case, we plan to contact the patient by telephone for a follow-up status report regarding this interventional procedure.  Comments:  No additional relevant information.    Plan of Care   patient is also requesting a refill of her  tramadol.  Last refill was August 19, 2023. She states that her pain is also more difficult to manage at night.  She is currently on gabapentin 300 mg twice daily.  I recommended that she increase her nighttime dose to 600 mg nightly. I will see her back in 1 month for intra-articular gel injection in her left knee  Pharmacotherapy Assessment  Analgesic: Tramadol 50 mg every 6 hrs needed, quantity 120/month    Monitoring: [place] PMP: PDMP reviewed during this encounter.       Pharmacotherapy: No side-effects or adverse reactions reported. Compliance: No problems identified. Effectiveness: Clinically acceptable.  UDS:  Summary  Date Value Ref Range Status  04/24/2023 Note  Final    Comment:    ==================================================================== [employer] Select 13 (MW) ==================================================================== Test                             Result       Flag       Units  Drug Present and Declared for Prescription Verification   Tramadol                       9278         EXPECTED   ng/mg creat   O-Desmethyltramadol            5076         EXPECTED   ng/mg creat   N-Desmethyltramadol            507          EXPECTED   ng/mg creat    Source of tramadol is a prescription medication. O-desmethyltramadol    and N-desmethyltramadol are expected metabolites of tramadol.  ==================================================================== Test                      Result    Flag   Units  Ref Range   Creatinine              46               mg/dL      >=53 ==================================================================== Declared Medications:  The flagging and interpretation on this report are based on the  following declared medications.  Unexpected results may arise from  inaccuracies in the declared medications.   **Note: The testing scope of this panel includes these medications:   Tramadol (Ultram)   **Note: The testing scope of  this panel does not include the  following reported medications:   Albuterol (Ventolin HFA)  Budesonide (Symbicort)  Cetirizine (Zyrtec)  Duloxetine (Cymbalta)  Formoterol (Symbicort)  Furosemide (Lasix)  Gabapentin (Neurontin)  Hydrochlorothiazide (Zestoretic)  Ibuprofen (Advil)  Lisinopril (Zestoretic)  Montelukast (Singulair)  Omeprazole (Prilosec)  Triamcinolone (Kenalog)  Vitamin B12 ==================================================================== For clinical consultation, please call [phone]. ====================================================================      1. Primary osteoarthritis of left knee - KNEE INJECTION - KNEE INJECTION; Future - traMADol (ULTRAM) 50 MG tablet; Take 1 tablet (50 mg total) by mouth every 6 (six) hours as needed.  Dispense: 120 tablet; Refill: 3  2. Chronic pain of left knee - KNEE INJECTION - KNEE INJECTION; Future - traMADol (ULTRAM) 50 MG tablet; Take 1 tablet (50 mg total) by mouth every 6 (six) hours as needed.  Dispense: 120 tablet; Refill: 3  3. Chronic pain syndrome - traMADol (ULTRAM) 50 MG tablet; Take 1 tablet (50 mg total) by mouth every 6 (six) hours as needed.  Dispense: 120 tablet; Refill: 3  4. Fibromyalgia - traMADol (ULTRAM) 50 MG tablet; Take 1 tablet (50 mg total) by mouth every 6 (six) hours as needed.  Dispense: 120 tablet; Refill: 3  5. Status post total right knee replacement - traMADol (ULTRAM) 50 MG tablet; Take 1 tablet (50 mg total) by mouth every 6 (six) hours as needed.  Dispense: 120 tablet; Refill: 3  6. Traumatic complete tear of left rotator cuff, sequela - traMADol (ULTRAM) 50 MG tablet; Take 1 tablet (50 mg total) by mouth every 6 (six) hours as needed.  Dispense: 120 tablet; Refill: 3    Medications ordered for procedure: Meds ordered this encounter  Medications   lidocaine (XYLOCAINE) 2 % (with pres) injection 400 mg   ropivacaine (PF) 2 mg/mL (0.2%) (NAROPIN) injection 4 mL    methylPREDNISolone acetate (DEPO-MEDROL) injection 40 mg   traMADol (ULTRAM) 50 MG tablet    Sig: Take 1 tablet (50 mg total) by mouth every 6 (six) hours as needed.    Dispense:  120 tablet    Refill:  3   gabapentin (NEURONTIN) 300 MG capsule    Sig: 300 mg q AM, 600 mg qHS    Dispense:  90 capsule    Refill:  5   Medications administered: We administered lidocaine, ropivacaine (PF) 2 mg/mL (0.2%), and methylPREDNISolone acetate.  See the medical record for exact dosing, route, and time of administration.  Follow-up plan:   Return in 4 weeks (on 11/12/2023) for left knee hyalgan.     Recent Visits No visits were found meeting these conditions. Showing recent visits within past 90 days and meeting all other requirements Today's Visits Date Type Provider Dept  10/15/23 Procedure visit Edward Jolly, MD [facility]  Showing today's visits and meeting all other requirements Future Appointments No visits were found meeting these conditions. Showing future appointments within next 90 days and meeting all other requirements  Disposition: Discharge home  Discharge (Date  Time): 10/15/2023; 1350 hrs.   Primary Care Physician: Myrene Buddy, NP Location: [facility] Note by: Edward Jolly, MD Date: 10/15/2023; Time: 3:14 PM  Disclaimer:  Medicine is not an exact science. The only guarantee in medicine is that nothing is guaranteed. It is important to note that the decision to proceed with this intervention was based on the information collected from the patient. The Data and conclusions were drawn from the patient's questionnaire, the interview, and the physical examination. Because the information was provided in large part by the patient, it cannot be guaranteed that it has not been purposely or unconsciously manipulated. Every effort has been made to obtain as much relevant data as possible for this evaluation. It is important to note that  the conclusions that lead to this procedure are derived in large part from the available data. Always take into account that the treatment will also be dependent on availability of resources and existing treatment guidelines, considered by other Pain Management Practitioners as being common knowledge and practice, at the time of the intervention. For Medico-Legal purposes, it is also important to point out that variation in procedural techniques and pharmacological choices are the acceptable norm. The indications, contraindications, technique, and results of the above procedure should only be interpreted and judged by a Board-Certified Interventional Pain Specialist with extensive familiarity and expertise in the same exact procedure and technique.

## 2023-10-15 NOTE — Progress Notes (Signed)
Nursing Pain Medication Assessment:  Safety precautions to be maintained throughout the outpatient stay will include: orient to surroundings, keep bed in low position, maintain call bell within reach at all times, provide assistance with transfer out of bed and ambulation.  Medication Inspection Compliance: Amy Logan did not comply with our request to bring her pills to be counted. She was reminded that bringing the medication bottles, even when empty, is a requirement.  Medication: None brought in. Pill/Patch Count: None available to be counted. Bottle Appearance: No container available. Did not bring bottle(s) to appointment. Filled Date: N/A Last Medication intake:  Ran out of medicine more than 48 hours ago

## 2023-10-16 ENCOUNTER — Telehealth: Payer: Self-pay

## 2023-10-16 NOTE — Telephone Encounter (Signed)
Post procedure follow up.  LM 

## 2023-11-12 ENCOUNTER — Encounter: Payer: Self-pay | Admitting: Student in an Organized Health Care Education/Training Program

## 2023-11-12 ENCOUNTER — Ambulatory Visit
Payer: 59 | Attending: Student in an Organized Health Care Education/Training Program | Admitting: Student in an Organized Health Care Education/Training Program

## 2023-11-12 VITALS — BP 149/89 | Temp 97.3°F | Resp 16 | Ht 66.0 in | Wt 176.0 lb

## 2023-11-12 DIAGNOSIS — Z9049 Acquired absence of other specified parts of digestive tract: Secondary | ICD-10-CM | POA: Diagnosis not present

## 2023-11-12 DIAGNOSIS — M1712 Unilateral primary osteoarthritis, left knee: Secondary | ICD-10-CM | POA: Insufficient documentation

## 2023-11-12 DIAGNOSIS — Z01818 Encounter for other preprocedural examination: Secondary | ICD-10-CM | POA: Diagnosis not present

## 2023-11-12 MED ORDER — SODIUM HYALURONATE (VISCOSUP) 16.8 MG/2ML IX SOSY
16.8000 mg | PREFILLED_SYRINGE | Freq: Once | INTRA_ARTICULAR | Status: AC
  Administered 2023-11-12: 16.8 mg via INTRA_ARTICULAR
  Filled 2023-11-12: qty 2

## 2023-11-12 MED ORDER — LIDOCAINE HCL 2 % IJ SOLN
20.0000 mL | Freq: Once | INTRAMUSCULAR | Status: AC
  Administered 2023-11-12: 400 mg

## 2023-11-12 NOTE — Patient Instructions (Signed)

## 2023-11-12 NOTE — Progress Notes (Signed)
Safety precautions to be maintained throughout the outpatient stay will include: orient to surroundings, keep bed in low position, maintain call bell within reach at all times, provide assistance with transfer out of bed and ambulation.  

## 2023-11-12 NOTE — Progress Notes (Signed)
PROVIDER NOTE: Information contained herein reflects review and annotations entered in association with encounter. Interpretation of such information and data should be left to medically-trained personnel. Information provided to patient can be located elsewhere in the medical record under "Patient Instructions". Document created using STT-dictation technology, any transcriptional errors that may result from process are unintentional.    Patient: Amy Logan  Service Category: Procedure  Provider: Edward Jolly, MD  DOB: Jun 03, 1971  DOS: 11/12/2023  Location: [facility]  MRN: [id number]  Setting: Ambulatory - outpatient  Referring Provider: Myrene Buddy, *  Type: Established Patient  Specialty: Interventional Pain Management  PCP: Myrene Buddy, NP   Primary Reason for Visit: Interventional Pain Management Treatment and Medication Management E/M Visit CC: Knee Pain  Procedure:          Anesthesia, Analgesia, Anxiolysis:  Type: Therapeutic Intra-Articular  GELSYN-3  Knee Injection #1 Region: Medial infrapatellar Knee Region Level: Knee Joint Laterality: Left knee  Type: Local Anesthesia Indication(s): Analgesia         Local Anesthetic: Lidocaine 1-2% Route: Infiltration ([place]/IM) IV Access: Declined Sedation: Declined   Position: Sitting   Indications: 1. Primary osteoarthritis of left knee    Pain Score: Pre-procedure: 9 /10 Post-procedure: 9 /10   Pre-op Assessment:  Amy Logan is a 52 y.o. (year old), female patient, seen today for interventional treatment. She  has a past surgical history that includes Cholecystectomy; Appendectomy; Replacement total knee; Abdominal hysterectomy; Joint replacement; Breast cyst aspiration (Left); Colonoscopy (2018); cyst removed; and Lipoma excision (Right, 10/09/2018). Amy Logan has a current medication list which includes the following prescription(s): albuterol, budesonide-formoterol, cetirizine, cyanocobalamin,  cyclobenzaprine, furosemide, gabapentin, ibuprofen, lisinopril-hydrochlorothiazide, montelukast, omeprazole, tramadol, triamcinolone, and duloxetine. Her primarily concern today is the Knee Pain  Initial Vital Signs:  Pulse/HCG Rate:    Temp: (!) 97.3 F (36.3 C) Resp: 16 BP: (!) 149/89 SpO2:    BMI: Estimated body mass index is 28.41 kg/m as calculated from the following:   Height as of this encounter: 5\' 6"  (1.676 m).   Weight as of this encounter: 176 lb (79.8 kg).  Risk Assessment: Allergies: Reviewed. She is allergic to morphine, ondansetron, onion, sumatriptan, butorphanol, promethazine, coconut flavoring agent (non-screening), demerol [meperidine], phenergan [promethazine hcl], and zofran [ondansetron hcl].  Allergy Precautions: None required Coagulopathies: Reviewed. None identified.  Blood-thinner therapy: None at this time Active Infection(s): Reviewed. None identified. Amy Logan is afebrile  Site Confirmation: Amy Logan was asked to confirm the procedure and laterality before marking the site Procedure checklist: Completed Consent: Before the procedure and under the influence of no sedative(s), amnesic(s), or anxiolytics, the patient was informed of the treatment options, risks and possible complications. To fulfill our ethical and legal obligations, as recommended by the American Medical Association's Code of Ethics, I have informed the patient of my clinical impression; the nature and purpose of the treatment or procedure; the risks, benefits, and possible complications of the intervention; the alternatives, including doing nothing; the risk(s) and benefit(s) of the alternative treatment(s) or procedure(s); and the risk(s) and benefit(s) of doing nothing. The patient was provided information about the general risks and possible complications associated with the procedure. These may include, but are not limited to: failure to achieve desired goals, infection, bleeding, organ or  nerve damage, allergic reactions, paralysis, and death. In addition, the patient was informed of those risks and complications associated to the procedure, such as failure to decrease pain; infection; bleeding; organ or nerve damage with subsequent damage  to sensory, motor, and/or autonomic systems, resulting in permanent pain, numbness, and/or weakness of one or several areas of the body; allergic reactions; (i.e.: anaphylactic reaction); and/or death. Furthermore, the patient was informed of those risks and complications associated with the medications. These include, but are not limited to: allergic reactions (i.e.: anaphylactic or anaphylactoid reaction(s)); adrenal axis suppression; blood sugar elevation that in diabetics may result in ketoacidosis or comma; water retention that in patients with history of congestive heart failure may result in shortness of breath, pulmonary edema, and decompensation with resultant heart failure; weight gain; swelling or edema; medication-induced neural toxicity; particulate matter embolism and blood vessel occlusion with resultant organ, and/or nervous system infarction; and/or aseptic necrosis of one or more joints. Finally, the patient was informed that Medicine is not an exact science; therefore, there is also the possibility of unforeseen or unpredictable risks and/or possible complications that may result in a catastrophic outcome. The patient indicated having understood very clearly. We have given the patient no guarantees and we have made no promises. Enough time was given to the patient to ask questions, all of which were answered to the patient's satisfaction. Amy Logan has indicated that she wanted to continue with the procedure. Attestation: I, the ordering provider, attest that I have discussed with the patient the benefits, risks, side-effects, alternatives, likelihood of achieving goals, and potential problems during recovery for the procedure that I have  provided informed consent. Date  Time: 11/12/2023  9:28 AM  Pre-Procedure Preparation:  Monitoring: As per clinic protocol. Respiration, ETCO2, SpO2, BP, heart rate and rhythm monitor placed and checked for adequate function Safety Precautions: Patient was assessed for positional comfort and pressure points before starting the procedure. Time-out: I initiated and conducted the "Time-out" before starting the procedure, as per protocol. The patient was asked to participate by confirming the accuracy of the "Time Out" information. Verification of the correct person, site, and procedure were performed and confirmed by me, the nursing staff, and the patient. "Time-out" conducted as per Joint Commission's Universal Protocol (UP.01.01.01). Time: 0942  Description of Procedure:          Target Area: Knee Joint Approach: Just above the Medial tibial plateau, lateral to the infrapatellar tendon. Area Prepped: Entire knee area, from the mid-thigh to the mid-shin. DuraPrep (Iodine Povacrylex [0.7% available iodine] and Isopropyl Alcohol, 74% w/w) Safety Precautions: Aspiration looking for blood return was conducted prior to all injections. At no point did we inject any substances, as a needle was being advanced. No attempts were made at seeking any paresthesias. Safe injection practices and needle disposal techniques used. Medications properly checked for expiration dates. SDV (single dose vial) medications used. Description of the Procedure: Protocol guidelines were followed. The patient was placed in position over the fluoroscopy table. The target area was identified and the area prepped in the usual manner. Skin & deeper tissues infiltrated with local anesthetic. Appropriate amount of time allowed to pass for local anesthetics to take effect. The procedure needles were then advanced to the target area. Proper needle placement secured. Negative aspiration confirmed. Solution injected in intermittent fashion,  asking for systemic symptoms every 0.5cc of injectate. The needles were then removed and the area cleansed, making sure to leave some of the prepping solution back to take advantage of its long term bactericidal properties. Vitals:   11/12/23 0932  BP: (!) 149/89  Resp: 16  Temp: (!) 97.3 F (36.3 C)  Weight: 176 lb (79.8 kg)  Height: 5\' 6"  (1.676  m)     Start Time: 0942 hrs. End Time: 0945 hrs. Materials:  Needle(s) Type: Regular needle Gauge: 25G Length: 1.5-in Medication(s): Please see orders for medications and dosing details.  Post-operative Assessment:  Post-procedure Vital Signs:  Pulse/HCG Rate:    Temp: (!) 97.3 F (36.3 C) Resp: 16 BP: (!) 149/89 SpO2:    EBL: None  Complications: No immediate post-treatment complications observed by team, or reported by patient.  Note: The patient tolerated the entire procedure well. A repeat set of vitals were taken after the procedure and the patient was kept under observation following institutional policy, for this type of procedure. Post-procedural neurological assessment was performed, showing return to baseline, prior to discharge. The patient was provided with post-procedure discharge instructions, including a section on how to identify potential problems. Should any problems arise concerning this procedure, the patient was given instructions to immediately contact us, at any time, without hesitation. In any case, we plan to contact the patient by telephone for a follow-up status report regarding this interventional procedure.  Comments:  No additional relevant information.    Plan of Care     1. Primary osteoarthritis of left knee (Primary) - KNEE INJECTION; Standing Repeat GELSYN #2 within 1 month   Medications ordered for procedure: Meds ordered this encounter  Medications   sodium hyaluronate (viscosup) (GELSYN-3) intra-articular injection 16.8 mg    Do not substitute. Deliver to facility day before procedure.    lidocaine (XYLOCAINE) 2 % (with pres) injection 400 mg   Medications administered: We administered sodium hyaluronate (viscosup) and lidocaine.  See the medical record for exact dosing, route, and time of administration.  Follow-up plan:   Return in about 4 weeks (around 12/10/2023) for GELSYN3 #2 PRN.     Recent Visits Date Type Provider Dept  10/15/23 Procedure visit Edward Jolly, MD [facility]  Showing recent visits within past 90 days and meeting all other requirements Today's Visits Date Type Provider Dept  11/12/23 Procedure visit Edward Jolly, MD [facility]  Showing today's visits and meeting all other requirements Future Appointments No visits were found meeting these conditions. Showing future appointments within next 90 days and meeting all other requirements  Disposition: Discharge home  Discharge (Date  Time): 11/12/2023;   hrs.   Primary Care Physician: Myrene Buddy, NP Location: [facility] Note by: Edward Jolly, MD Date: 11/12/2023; Time: 9:50 AM  Disclaimer:  Medicine is not an exact science. The only guarantee in medicine is that nothing is guaranteed. It is important to note that the decision to proceed with this intervention was based on the information collected from the patient. The Data and conclusions were drawn from the patient's questionnaire, the interview, and the physical examination. Because the information was provided in large part by the patient, it cannot be guaranteed that it has not been purposely or unconsciously manipulated. Every effort has been made to obtain as much relevant data as possible for this evaluation. It is important to note that the conclusions that lead to this procedure are derived in large part from the available data. Always take into account that the treatment will also be dependent on availability of resources and existing treatment guidelines, considered by other  Pain Management Practitioners as being common knowledge and practice, at the time of the intervention. For Medico-Legal purposes, it is also important to point out that variation in procedural techniques and pharmacological choices are the acceptable norm. The indications, contraindications, technique, and  results of the above procedure should only be interpreted and judged by a Board-Certified Interventional Pain Specialist with extensive familiarity and expertise in the same exact procedure and technique.

## 2023-11-13 NOTE — Telephone Encounter (Signed)
Called PP denies any needs at this time. States she is feeling good today.Instructed to call if needed.

## 2023-12-02 ENCOUNTER — Encounter: Payer: Self-pay | Admitting: Emergency Medicine

## 2023-12-02 ENCOUNTER — Ambulatory Visit
Admission: EM | Admit: 2023-12-02 | Discharge: 2023-12-02 | Disposition: A | Discharge status: Home / Self Care | Payer: 59 | Attending: Family Medicine | Admitting: Family Medicine

## 2023-12-02 DIAGNOSIS — L4050 Arthropathic psoriasis, unspecified: Secondary | ICD-10-CM

## 2023-12-02 MED ORDER — PREDNISONE 10 MG PO TABS: ORAL_TABLET | ORAL | 0 refills | Status: DC

## 2023-12-02 MED ORDER — DEXAMETHASONE SODIUM PHOSPHATE 10 MG/ML IJ SOLN
10.0000 mg | Freq: Once | INTRAMUSCULAR | Status: AC
  Administered 2023-12-02: 10 mg via INTRAMUSCULAR

## 2023-12-02 NOTE — ED Provider Notes (Signed)
 [facility]    CSN: [id number] Arrival date & time: 12/02/23  1453      History   Chief Complaint Chief Complaint  Patient presents with   Pain    Chronic    HPI  HPI Amy Logan is a 53 y.o. female.   Latera presents for bilateral knee, shoulder and elbow and the muscles attached.  This morning, her husband had to push her to get out of pain and she didn't have strength to get up. Has a lot of stiffness.  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  Feels like this pushed her over the edge.  She has worsening rash on her forearms.    She follows with [facility] but they couldn't see her until March. She was taken off her Humera as she has bone marrow suppression.   Taking ibuprofen and tramadol  for pain which is helping but she is too stiff. Requested steroids if this is helped her flare ups in the past.     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   Fibromyalgia    History of hiatal hernia    SMALL   History of kidney stones    H/O   Hypertension    Neuropathy     Patient Active Problem List   Diagnosis Date Noted   Stiffness of shoulder joint, left 02/01/2022   Traumatic complete tear of left rotator cuff 01/19/2022   Chronic pain syndrome 01/19/2022   Tear of left rotator cuff 01/15/2022   Arthritis of left acromioclavicular joint 01/03/2022   Tendonitis of shoulder, left 01/03/2022   Muscle strain of left shoulder 01/03/2022   Shoulder pain, left 01/03/2022   Arthritis of left shoulder region 01/03/2022   Primary osteoarthritis of left knee 03/08/2021   Fibromyalgia 08/18/2020   Chronic pain of left knee 08/18/2020   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   Psoriatic arthritis (HCC) 08/18/2020   Psoriasis 04/15/2020   Encounter for long-term (current) use of high-risk medication 04/15/2020   Increased  band cell count 03/20/2019   Chronic fatigue 03/20/2019   RLQ abdominal pain 12/30/2018   Leukocytosis 12/30/2018   Anxiety and depression 10/24/2018   Arthritis 10/24/2018   Chronic, continuous use of opioids 10/24/2018   Chronic insomnia 10/24/2018   Hyperlipidemia 10/24/2018   Hypertension 10/24/2018   Neuropathy 10/24/2018   Obesity (BMI 30-39.9) 10/24/2018   H/O excision of epidermal inclusion cyst 10/24/2018   SIRS (systemic inflammatory response syndrome) (HCC) 10/14/2018   Epidermal inclusion cyst    Mass of soft tissue of abdomen 09/30/2018   B12 deficiency 11/20/2016   Vitamin D  insufficiency 11/20/2016   Hx of normocytic normochromic anemia 12/20/2015   Incomplete tear of right rotator cuff 11/21/2015    Past Surgical History:  Procedure Laterality Date   ABDOMINAL HYSTERECTOMY     APPENDECTOMY     BREAST CYST ASPIRATION Left    neg   CHOLECYSTECTOMY     COLONOSCOPY  2018   cyst removed     thyroglossal duct cyst   JOINT REPLACEMENT     LIPOMA EXCISION Right 10/09/2018   NOT A LIPOMA.  EPIDERMAL INCLUSION CYST   REPLACEMENT TOTAL KNEE     Right knee    OB History   No obstetric history on  file.      Home Medications    Prior to Admission medications   Medication Sig Start Date End Date Taking? Authorizing Provider  predniSONE  (DELTASONE ) 10 MG tablet Take 5 tabs for 2 days, then 4 tabs for 2 days, then 3 tabs for 2 days, then 2 tabs for 2 days, then 1 tab for 2 days. 12/02/23  Yes Nelissa Bolduc, DO  albuterol  (VENTOLIN  HFA) 108 (90 Base) MCG/ACT inhaler Inhale into the lungs.    [provider]  budesonide -formoterol  (SYMBICORT ) 160-4.5 MCG/ACT inhaler Inhale 2 puffs into the lungs 2 (two) times daily. 10/26/22   Rodriguez-Southworth, Sylvia, PA-C  cetirizine (ZYRTEC) 10 MG tablet Take 10 mg by mouth daily as needed for allergies.    [provider]  cyanocobalamin (VITAMIN B12) 1000 MCG/ML injection Inject 1,000 mcg into the muscle  every 30 (thirty) days.    [provider]  cyclobenzaprine  (FLEXERIL ) 10 MG tablet Take 1 tablet (10 mg total) by mouth 2 (two) times daily as needed for muscle spasms. 06/09/23   Benjamin, Lunise, NP  DULoxetine  (CYMBALTA ) 30 MG capsule Take 1 capsule (30 mg total) by mouth daily. 01/19/22 04/24/23  Lateef, Bilal, MD  furosemide (LASIX) 20 MG tablet Take 20 mg by mouth daily as needed. 10/28/20   [provider]  gabapentin  (NEURONTIN ) 300 MG capsule 300 mg q AM, 600 mg qHS 10/15/23   Lateef, Bilal, MD  ibuprofen (ADVIL) 800 MG tablet Take 800 mg by mouth 3 (three) times daily.    [provider]  lisinopril -hydrochlorothiazide  (PRINZIDE ,ZESTORETIC ) 20-25 MG tablet Take 1 tablet by mouth every morning.    [provider]  montelukast (SINGULAIR) 10 MG tablet Take 10 mg by mouth daily as needed.    [provider]  omeprazole (PRILOSEC) 40 MG capsule Take 1 capsule by mouth daily. 11/22/21   [provider]  traMADol  (ULTRAM ) 50 MG tablet Take 1 tablet (50 mg total) by mouth every 6 (six) hours as needed. 10/15/23   Lateef, Bilal, MD  triamcinolone (KENALOG) 0.025 % cream Apply 1 application topically 2 (two) times daily.    [provider]    Family History Family History  Problem Relation Age of Onset   Diabetes Mother    Heart failure Mother    Asthma Mother    Cancer Father    Breast cancer Cousin 61       mat cousin    Social History Social History   Tobacco Use   Smoking status: Every Day    Current packs/day: 0.50    Average packs/day: 0.5 packs/day for 25.0 years (12.5 ttl pk-yrs)    Types: Cigarettes   Smokeless tobacco: Never  Vaping Use   Vaping status: Never Used  Substance Use Topics   Alcohol use: Not Currently    Comment: social   Drug use: No     Allergies   Morphine, Ondansetron , Onion, Sumatriptan, Butorphanol, Promethazine, Coconut flavoring agent (non-screening), Demerol [meperidine], Phenergan  [promethazine hcl], and Zofran  [ondansetron  hcl]   Review of Systems Review of Systems: :negative unless otherwise stated in HPI.      Physical Exam Triage Vital Signs ED Triage Vitals  Encounter Vitals Group     BP 12/02/23 1537 (!) 156/96     Systolic BP Percentile --      Diastolic BP Percentile --      Pulse Rate 12/02/23 1537 60     Resp 12/02/23 1537 14     Temp 12/02/23 1537  98.6 F (37 C)     Temp Source 12/02/23 1537 Oral     SpO2 12/02/23 1537 97 %     Weight 12/02/23 1536 175 lb 14.8 oz (79.8 kg)     Height 12/02/23 1536 5' 6 (1.676 m)     Head Circumference --      Peak Flow --      Pain Score 12/02/23 1535 10     Pain Loc --      Pain Education --      Exclude from Growth Chart --    No data found.  Updated Vital Signs BP (!) 156/96 (BP Location: Right Arm)   Pulse 60   Temp 98.6 F (37 C) (Oral)   Resp 14   Ht 5' 6 (1.676 m)   Wt 79.8 kg   SpO2 97%   BMI 28.40 kg/m   Visual Acuity Right Eye Distance:   Left Eye Distance:   Bilateral Distance:    Right Eye Near:   Left Eye Near:    Bilateral Near:     Physical Exam GEN: well appearing female in no acute distress  CVS: well perfused  RESP: speaking in full sentences without pause, no respiratory distress  MSK:  No significant bony deformity, asymmetry Generalized TTP of bilateral shoulders, knees and elbows Limited range of motion due to pain Strength 5/5 grip, elbow and shoulder.  Sensation intact. Peripheral pulses intact. Psoriatic appearing rash on bilateral forearms with silvery plaques on the elbows   UC Treatments / Results  Labs (all labs ordered are listed, but only abnormal results are displayed) Labs Reviewed - No data to display  EKG   Radiology No results found.   Procedures Procedures (including critical care time)  Medications Ordered in UC Medications  dexamethasone  (DECADRON ) injection 10 mg (10 mg Intramuscular Given 12/02/23 1607)    Initial  Impression / Assessment and Plan / UC Course  I have reviewed the triage vital signs and the nursing notes.  Pertinent labs & imaging results that were available during my care of the patient were reviewed by me and considered in my medical decision making (see chart for details).      Pt is a 53 y.o.  female with psoriatic arthritis who presents for acute flareup for the past month.  Imaging deferred.  Discussed Decadron  IM and she is agreeable.  Decadron  10 mg given. Patient to gradually return to normal activities, as tolerated and continue ordinary activities within the limits permitted by pain.  Continue tramadol , Motrin and Tylenol  PRN.  Long steroid taper prescribed.  Advised patient to avoid OTC NSAIDs while taking prescription NSAID. Counseled patient on red flag symptoms and when to seek immediate care.    Patient to follow up with rheumatologic provider, if symptoms do not improve with conservative treatment.  Return and ED precautions given. Understanding voiced. Discussed MDM, treatment plan and plan for follow-up with patient  who agrees with plan.   Final Clinical Impressions(s) / UC Diagnoses   Final diagnoses:  Arthritis with psoriasis [facility])     Discharge Instructions      Stop by the pharmacy to pick up your steroids and start them tomorrow.  Continue taking the tramadol  and ibuprofen as needed for pain.  Follow-up with your rheumatologist as scheduled.     ED Prescriptions     Medication Sig Dispense Auth. Provider   predniSONE  (DELTASONE ) 10 MG tablet Take 5 tabs for 2 days, then 4 tabs for 2  days, then 3 tabs for 2 days, then 2 tabs for 2 days, then 1 tab for 2 days. 30 tablet Ebonee Stober, DO      PDMP not reviewed this encounter.   Camiya Vinal, DO 12/02/23 1618

## 2023-12-02 NOTE — ED Triage Notes (Signed)
 Patient states that her autoimmune pain flare up started 3 weeks ago.  Patient states that she usually takes Prednsione when this happens.

## 2023-12-02 NOTE — Discharge Instructions (Signed)
 Stop by the pharmacy to pick up your steroids and start them tomorrow.  Continue taking the tramadol and ibuprofen as needed for pain.  Follow-up with your rheumatologist as scheduled.

## 2024-01-07 ENCOUNTER — Ambulatory Visit
Admission: EM | Admit: 2024-01-07 | Discharge: 2024-01-07 | Disposition: A | Discharge status: Home / Self Care | Payer: 59 | Attending: Emergency Medicine | Admitting: Emergency Medicine

## 2024-01-07 DIAGNOSIS — M069 Rheumatoid arthritis, unspecified: Secondary | ICD-10-CM

## 2024-01-07 DIAGNOSIS — L4050 Arthropathic psoriasis, unspecified: Secondary | ICD-10-CM

## 2024-01-07 MED ORDER — DEXAMETHASONE SODIUM PHOSPHATE 10 MG/ML IJ SOLN
10.0000 mg | Freq: Once | INTRAMUSCULAR | Status: AC
  Administered 2024-01-07: 10 mg via INTRAMUSCULAR

## 2024-01-07 MED ORDER — PREDNISONE 10 MG PO TABS: ORAL_TABLET | ORAL | 0 refills | Status: DC

## 2024-01-07 NOTE — Discharge Instructions (Addendum)
Take the prednisone as directed for treatment of your psoriatic arthritis.Marland Kitchen  Keep your follow-up appointment with rheumatology next month.

## 2024-01-07 NOTE — ED Triage Notes (Signed)
Pt c/o joint pain x1 mon. Hx of fibromyalgia & arthritis.

## 2024-01-07 NOTE — ED Provider Notes (Addendum)
[facility]    CSN: [id number] Arrival date & time: 01/07/24  1227      History   Chief Complaint Chief Complaint  Patient presents with   Joint Pain    HPI Amy Logan is a 53 y.o. female.   HPI  53 year old female with past medical history significant for psoriatic arthritis, fibromyalgia, anxiety, hypertension, and anemia presents for evaluation of a psoriatic arthritis flare.  She is currently experiencing pain in her wrists, knees, and in her right shoulder.  She tried contacting her rheumatologist but they are unable to see her until March.  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   Fibromyalgia    History of hiatal hernia    SMALL   History of kidney stones    H/O   Hypertension    Neuropathy     Patient Active Problem List   Diagnosis Date Noted   Stiffness of shoulder joint, left 02/01/2022   Traumatic complete tear of left rotator cuff 01/19/2022   Chronic pain syndrome 01/19/2022   Tear of left rotator cuff 01/15/2022   Arthritis of left acromioclavicular joint 01/03/2022   Tendonitis of shoulder, left 01/03/2022   Muscle strain of left shoulder 01/03/2022   Shoulder pain, left 01/03/2022   Arthritis of left shoulder region 01/03/2022   Primary osteoarthritis of left knee 03/08/2021   Fibromyalgia 08/18/2020   Chronic pain of left knee 08/18/2020   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   Psoriatic arthritis (HCC) 08/18/2020   Psoriasis 04/15/2020   Encounter for long-term (current) use of high-risk medication 04/15/2020   Increased band cell count 03/20/2019   Chronic fatigue 03/20/2019   RLQ abdominal pain 12/30/2018   Leukocytosis 12/30/2018   Anxiety and depression 10/24/2018   Arthritis 10/24/2018   Chronic, continuous use of opioids 10/24/2018   Chronic insomnia 10/24/2018   Hyperlipidemia 10/24/2018   Hypertension  10/24/2018   Neuropathy 10/24/2018   Obesity (BMI 30-39.9) 10/24/2018   H/O excision of epidermal inclusion cyst 10/24/2018   SIRS (systemic inflammatory response syndrome) (HCC) 10/14/2018   Epidermal inclusion cyst    Mass of soft tissue of abdomen 09/30/2018   B12 deficiency 11/20/2016   Vitamin D insufficiency 11/20/2016   Hx of normocytic normochromic anemia 12/20/2015   Incomplete tear of right rotator cuff 11/21/2015    Past Surgical History:  Procedure Laterality Date   ABDOMINAL HYSTERECTOMY     APPENDECTOMY     BREAST CYST ASPIRATION Left    neg   CHOLECYSTECTOMY     COLONOSCOPY  2018   cyst removed     thyroglossal duct cyst   JOINT REPLACEMENT     LIPOMA EXCISION Right 10/09/2018   NOT A LIPOMA.  EPIDERMAL INCLUSION CYST   REPLACEMENT TOTAL KNEE     Right knee    OB History   No obstetric history on file.      Home Medications    Prior to Admission medications   Medication Sig Start Date End Date Taking? Authorizing Provider  albuterol (VENTOLIN HFA) 108 (90 Base) MCG/ACT inhaler Inhale into the lungs.   Yes [provider]  budesonide-formoterol (SYMBICORT) 160-4.5 MCG/ACT inhaler Inhale 2 puffs into the lungs 2 (two) times daily. 10/26/22  Yes Rodriguez-Southworth, Nettie Elm, PA-C  cetirizine (ZYRTEC) 10 MG tablet Take 10 mg by mouth daily as  needed for allergies.   Yes [provider]  cyanocobalamin (VITAMIN B12) 1000 MCG/ML injection Inject 1,000 mcg into the muscle every 30 (thirty) days.   Yes [provider]  cyclobenzaprine (FLEXERIL) 10 MG tablet Take 1 tablet (10 mg total) by mouth 2 (two) times daily as needed for muscle spasms. 06/09/23  Yes Bailey Mech, NP  DULoxetine (CYMBALTA) 30 MG capsule Take 1 capsule (30 mg total) by mouth daily. 01/19/22 01/07/24 Yes Edward Jolly, MD  furosemide (LASIX) 20 MG tablet Take 20 mg by mouth daily as needed. 10/28/20  Yes [provider]  gabapentin (NEURONTIN) 300 MG capsule  300 mg q AM, 600 mg qHS 10/15/23  Yes Lateef, Bilal, MD  ibuprofen (ADVIL) 800 MG tablet Take 800 mg by mouth 3 (three) times daily.   Yes [provider]  lisinopril-hydrochlorothiazide (PRINZIDE,ZESTORETIC) 20-25 MG tablet Take 1 tablet by mouth every morning.   Yes [provider]  montelukast (SINGULAIR) 10 MG tablet Take 10 mg by mouth daily as needed.   Yes [provider]  omeprazole (PRILOSEC) 40 MG capsule Take 1 capsule by mouth daily. 11/22/21  Yes [provider]  traMADol (ULTRAM) 50 MG tablet Take 1 tablet (50 mg total) by mouth every 6 (six) hours as needed. 10/15/23  Yes Edward Jolly, MD  triamcinolone (KENALOG) 0.025 % cream Apply 1 application topically 2 (two) times daily.   Yes [provider]  predniSONE (DELTASONE) 10 MG tablet Take 5 tabs for 2 days, then 4 tabs for 2 days, then 3 tabs for 2 days, then 2 tabs for 2 days, then 1 tab for 2 days. 01/07/24   Becky Augusta, NP    Family History Family History  Problem Relation Age of Onset   Diabetes Mother    Heart failure Mother    Asthma Mother    Cancer Father    Breast cancer Cousin 8       mat cousin    Social History Social History   Tobacco Use   Smoking status: Every Day    Current packs/day: 0.50    Average packs/day: 0.5 packs/day for 25.0 years (12.5 ttl pk-yrs)    Types: Cigarettes   Smokeless tobacco: Never  Vaping Use   Vaping status: Never Used  Substance Use Topics   Alcohol use: Not Currently    Comment: social   Drug use: No     Allergies   Morphine, Ondansetron, Onion, Sumatriptan, Butorphanol, Promethazine, Coconut flavoring agent (non-screening), Demerol [meperidine], Phenergan [promethazine hcl], and Zofran [ondansetron hcl]   Review of Systems Review of Systems  Musculoskeletal:  Positive for arthralgias. Negative for joint swelling.     Physical Exam Triage Vital Signs ED Triage Vitals  Encounter Vitals Group     BP 01/07/24 1418  127/86     Systolic BP Percentile --      Diastolic BP Percentile --      Pulse Rate 01/07/24 1418 60     Resp --      Temp 01/07/24 1418 98.5 F (36.9 C)     Temp Source 01/07/24 1418 Oral     SpO2 01/07/24 1418 100 %     Weight 01/07/24 1417 176 lb (79.8 kg)     Height 01/07/24 1417 5\' 6"  (1.676 m)     Head Circumference --      Peak Flow --      Pain Score 01/07/24 1420 10     Pain Loc --  Pain Education --      Exclude from Growth Chart --    No data found.  Updated Vital Signs BP 127/86 (BP Location: Right Arm)   Pulse 60   Temp 98.5 F (36.9 C) (Oral)   Ht 5\' 6"  (1.676 m)   Wt 176 lb (79.8 kg)   SpO2 100%   BMI 28.41 kg/m   Visual Acuity Right Eye Distance:   Left Eye Distance:   Bilateral Distance:    Right Eye Near:   Left Eye Near:    Bilateral Near:     Physical Exam Vitals and nursing note reviewed.  Constitutional:      Appearance: Normal appearance. She is not ill-appearing.  HENT:     Head: Normocephalic and atraumatic.  Musculoskeletal:        General: Tenderness present. No swelling. Normal range of motion.  Skin:    General: Skin is warm and dry.     Capillary Refill: Capillary refill takes less than 2 seconds.  Neurological:     General: No focal deficit present.     Mental Status: She is alert and oriented to person, place, and time.      UC Treatments / Results  Labs (all labs ordered are listed, but only abnormal results are displayed) Labs Reviewed - No data to display  EKG   Radiology No results found.  Procedures Procedures (including critical care time)  Medications Ordered in UC Medications  dexamethasone (DECADRON) injection 10 mg (has no administration in time range)    Initial Impression / Assessment and Plan / UC Course  I have reviewed the triage vital signs and the nursing notes.  Pertinent labs & imaging results that were available during my care of the patient were reviewed by me and considered in my  medical decision making (see chart for details).   Patient is a pleasant, nontoxic-appearing 53 year old female presenting for evaluation of multiple joint pain as outlined HPI above.  She has a history of psoriatic arthritis and she is experiencing a flare.  She works as a Insurance account manager.  She was seen 1 month ago for similar symptoms and was treated with IM Decadron followed by a taper of prednisone and she reports that that worked for her.  She does have an upcoming appointment next month with her rheumatologist.  I will order 10 mg of IM Decadron followed by a prednisone taper.   Final Clinical Impressions(s) / UC Diagnoses   Final diagnoses:  Arthritis with psoriasis [facility])     Discharge Instructions      Take the prednisone as directed for treatment of your psoriatic arthritis.Marland Kitchen  Keep your follow-up appointment with rheumatology next month.     ED Prescriptions     Medication Sig Dispense Auth. Provider   predniSONE (DELTASONE) 10 MG tablet Take 5 tabs for 2 days, then 4 tabs for 2 days, then 3 tabs for 2 days, then 2 tabs for 2 days, then 1 tab for 2 days. 30 tablet Becky Augusta, NP      PDMP not reviewed this encounter.   Becky Augusta, NP 01/07/24 1448    Becky Augusta, NP 01/07/24 [phone]

## 2024-03-19 NOTE — Progress Notes (Signed)
 PROVIDER NOTE: Interpretation of information contained herein should be left to medically-trained personnel. Specific patient instructions are provided elsewhere under "Patient Instructions" section of medical record. This document was created in part using AI and STT-dictation technology, any transcriptional errors that may result from this process are unintentional.  Patient: Amy Logan  Service: E/M   PCP: Deliah Fells, NP  DOB: 04-28-1971  DOS: 03/20/2024  Provider: Cherylin Corrigan, NP  MRN: [id number]  Delivery: Face-to-face  Specialty: Interventional Pain Management  Type: Established Patient  Setting: Ambulatory outpatient facility  Specialty designation: 09  Referring Prov.: [name], Asenath Blacker, *  Location: Outpatient office facility       HPI  Ms. [name], a 53 y.o. year old female, is here today because of her No primary diagnosis found.. Ms. Ficarra primary complain today is Knee Pain (left)  Pertinent problems: Ms. Tulk does not have any pertinent problems on file. Pain Assessment: Severity of Chronic pain is reported as a 6 /10. Location: Knee Left/radiates up and down left leg. Onset: More than a month ago. Quality: Sharp, Aching, Tightness. Timing: Constant. Modifying factor(s): meds, ice, epsom salt bath. Vitals:  height is 5\' 6"  (1.676 m) and weight is 178 lb (80.7 kg). Her temperature is 97.4 F (36.3 C) (abnormal). Her blood pressure is 135/88 and her pulse is 66. Her oxygen saturation is 100%.  BMI: Estimated body mass index is 28.73 kg/m as calculated from the following:   Height as of this encounter: 5\' 6"  (1.676 m).   Weight as of this encounter: 178 lb (80.7 kg). Last encounter: Visit date not found. Last procedure: Visit date not found.  Reason for encounter: medication management. No change in medical history since last visit.  Patient's pain is at baseline.  Patient continues multimodal pain regimen as prescribed.  States that it provides pain relief and  improvement in functional status.  The patient complains of chronic left knee pain, which has been persistent.  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   Pharmacotherapy Assessment  Analgesic: Tramadol  (Ultram ) 50 mg tablet every 6 hours as needed for pain. MME=40 Gabapentin  300 mg q AM, 600 Q HS Monitoring: [place] PMP: PDMP reviewed during this encounter.       Pharmacotherapy: No side-effects or adverse reactions reported. Compliance: No problems identified. Effectiveness: Clinically acceptable.  Merilyn Staple, RN  03/20/2024  9:01 AM  Sign when Signing Visit Safety precautions to be maintained throughout the outpatient stay will include: orient to surroundings, keep bed in low position, maintain call bell within reach at all times, provide assistance with transfer out of bed and ambulation.   Nursing Pain Medication Assessment:  Safety precautions to be maintained throughout the outpatient stay will include: orient to surroundings, keep bed in low position, maintain call bell within reach at all times, provide assistance with transfer out of bed and ambulation.  Medication Inspection Compliance: Pill count conducted under aseptic conditions, in front of the patient. Neither the pills nor the bottle was removed from the patient's sight at any time. Once count was completed pills were immediately returned to the patient in their original bottle.  Medication: Tramadol  (Ultram ) Pill/Patch Count:  0 of 120 pills remain Pill/Patch Appearance: Markings consistent with prescribed medication Bottle Appearance: Standard pharmacy container. Clearly labeled. Filled Date: 2 / 23 / 2025 Last Medication intake:  Ran out of medicine more than 48 hours ago  No results found for: "CBDTHCR" No results found for: "D8THCCBX" No results found for: "D9THCCBX"  UDS:  Summary  Date Value Ref Range Status  04/24/2023 Note   Final    Comment:    ==================================================================== [employer]ox[employer] Select 13 (MW) ==================================================================== Test                             Result       Flag       Units  Drug Present and Declared for Prescription Verification   Tramadol                        9278         EXPECTED   ng/mg creat   O-Desmethyltramadol            5076         EXPECTED   ng/mg creat   N-Desmethyltramadol            507          EXPECTED   ng/mg creat    Source of tramadol  is a prescription medication. O-desmethyltramadol    and N-desmethyltramadol are expected metabolites of tramadol .  ==================================================================== Test                      Result    Flag   Units      Ref Range   Creatinine              46               mg/dL      >=16 ==================================================================== Declared Medications:  The flagging and interpretation on this report are based on the  following declared medications.  Unexpected results may arise from  inaccuracies in the declared medications.   **Note: The testing scope of this panel includes these medications:   Tramadol  (Ultram )   **Note: The testing scope of this panel does not include the  following reported medications:   Albuterol  (Ventolin  HFA)  Budesonide  (Symbicort )  Cetirizine (Zyrtec)  Duloxetine  (Cymbalta )  Formoterol  (Symbicort )  Furosemide (Lasix)  Gabapentin  (Neurontin )  Hydrochlorothiazide  (Zestoretic )  Ibuprofen (Advil)  Lisinopril  (Zestoretic )  Montelukast (Singulair)  Omeprazole (Prilosec)  Triamcinolone (Kenalog)  Vitamin B12 ==================================================================== For clinical consultation, please call [phone]. ====================================================================       ROS  Constitutional: Denies any fever or chills Gastrointestinal: No  reported hemesis, hematochezia, vomiting, or acute GI distress Musculoskeletal: Left knee pain  Neurological: No reported episodes of acute onset apraxia, aphasia, dysarthria, agnosia, amnesia, paralysis, loss of coordination, or loss of consciousness  Medication Review  albuterol , budesonide -formoterol , cetirizine, cyanocobalamin, cyclobenzaprine , fluticasone , furosemide, gabapentin , ibuprofen, lisinopril -hydrochlorothiazide , montelukast, omeprazole, predniSONE , traMADol , and triamcinolone  History Review  Allergy: Ms. Rigoni is allergic to morphine, ondansetron , onion, sumatriptan, butorphanol, promethazine, coconut flavoring agent (non-screening), demerol [meperidine], phenergan [promethazine hcl], and zofran  [ondansetron  hcl]. Drug: Ms. Cundiff  reports no history of drug use. Alcohol:  reports that she does not currently use alcohol. Tobacco:  reports that she has been smoking cigarettes. She has a 12.5 pack-year smoking history. She has never used smokeless tobacco. Social: Ms. Bisby  reports that she has been smoking cigarettes. She has a 12.5 pack-year smoking history. She has never used smokeless tobacco. She reports that she does not currently use alcohol. She reports that she does not use drugs. Medical:  has a past medical history of Anemia, Anxiety, Arthritis,  Bradycardia, Cancer (HCC) (1990), Fibromyalgia, History of hiatal hernia, History of kidney stones, Hypertension, and Neuropathy. Surgical: Ms. Matsuno  has a past surgical history that includes Cholecystectomy; Appendectomy; Replacement total knee; Abdominal hysterectomy; Joint replacement; Breast cyst aspiration (Left); Colonoscopy (2018); cyst removed; and Lipoma excision (Right, 10/09/2018). Family: family history includes Asthma in her mother; Breast cancer (age of onset: 44) in her cousin; Cancer in her father; Diabetes in her mother; Heart failure in her mother.  Laboratory Chemistry Profile   Renal Lab Results  Component  Value Date   BUN 17 06/30/2023   CREATININE 0.85 06/30/2023   GFRAA >60 12/31/2018   GFRNONAA >60 06/30/2023    Hepatic Lab Results  Component Value Date   AST 17 06/30/2023   ALT 17 06/30/2023   ALBUMIN 4.4 06/30/2023   ALKPHOS 71 06/30/2023   LIPASE 36 06/30/2023    Electrolytes Lab Results  Component Value Date   NA 133 (L) 06/30/2023   K 3.5 06/30/2023   CL 99 06/30/2023   CALCIUM 9.5 06/30/2023   MG 2.1 03/06/2013    Bone No results found for: "VD25OH", "VD125OH2TOT", "ZO1096EA5", "WU9811BJ4", "25OHVITD1", "25OHVITD2", "25OHVITD3", "TESTOFREE", "TESTOSTERONE"  Inflammation (CRP: Acute Phase) (ESR: Chronic Phase) Lab Results  Component Value Date   LATICACIDVEN 0.9 06/30/2023         Note: Above Lab results reviewed.  Recent Imaging Review  DG Chest 2 View CLINICAL DATA:  Persistent cough  EXAM: CHEST - 2 VIEW  COMPARISON:  03/05/2013  FINDINGS: The heart size and mediastinal contours are within normal limits. Both lungs are clear. The visualized skeletal structures are unremarkable.  IMPRESSION: No active cardiopulmonary disease.  Electronically Signed   By: Esmeralda Hedge M.D.   On: 10/26/2022 15:52 Note: Reviewed        Physical Exam  General appearance: Well nourished, well developed, and well hydrated. In no apparent acute distress Mental status: Alert, oriented x 3 (person, place, & time)       Respiratory: No evidence of acute respiratory distress Eyes: PERLA Vitals: BP 135/88   Pulse 66   Temp (!) 97.4 F (36.3 C)   Ht 5\' 6"  (1.676 m)   Wt 178 lb (80.7 kg)   SpO2 100%   BMI 28.73 kg/m  BMI: Estimated body mass index is 28.73 kg/m as calculated from the following:   Height as of this encounter: 5\' 6"  (1.676 m).   Weight as of this encounter: 178 lb (80.7 kg). Ideal: Ideal body weight: 59.3 kg (130 lb 11.7 oz) Adjusted ideal body weight: 67.9 kg (149 lb 10.2 oz)  Musculoskeletal: Left knee pain worse with prolonged standing    Assessment   Diagnosis Status  1. Primary osteoarthritis of left knee   2. Chronic pain of left knee   3. Chronic pain syndrome   4. Fibromyalgia   5. Status post total right knee replacement   6. Traumatic complete tear of left rotator cuff, sequela    Controlled Controlled Controlled   Updated Problems: No problems updated.  Plan of Care  Problem-specific:  Assessment and Plan Will continue on current medication regimen.  Prescribing drug monitoring (PDMP) consistent with prescribed medication. Routine UDS ordered today.  Per patient conversation, she will check her work schedule and call the office to schedule a left knee injection with Dr. Rhesa Celeste.   Ms. Summers Miura has a current medication list which includes the following long-term medication(s): budesonide -formoterol , cetirizine, furosemide, montelukast, omeprazole, and gabapentin .  Pharmacotherapy (Medications Ordered): Meds  ordered this encounter  Medications   traMADol  (ULTRAM ) 50 MG tablet    Sig: Take 1 tablet (50 mg total) by mouth every 6 (six) hours as needed.    Dispense:  120 tablet    Refill:  3   gabapentin  (NEURONTIN ) 300 MG capsule    Sig: 300 mg q AM, 600 mg qHS    Dispense:  90 capsule    Refill:  5   Orders:  Orders Placed This Encounter  Procedures   ToxASSURE Select 13 (MW), Urine    Volume: 30 ml(s). Minimum 3 ml of urine is needed. Document temperature of fresh sample. Indications: Long term (current) use of opiate analgesic (Z30.865)    Release to patient:   Immediate   Follow-up plan:   Return in about 3 months (around 06/20/2024) for (F2F), (MM), Marthe Slain NP.        Recent Visits No visits were found meeting these conditions. Showing recent visits within past 90 days and meeting all other requirements Today's Visits Date Type Provider Dept  03/20/24 Office Visit Lesly Pontarelli K, NP [facility]  Showing today's visits and meeting all other requirements Future  Appointments No visits were found meeting these conditions. Showing future appointments within next 90 days and meeting all other requirements  I discussed the assessment and treatment plan with the patient. The patient was provided an opportunity to ask questions and all were answered. The patient agreed with the plan and demonstrated an understanding of the instructions.  Patient advised to call back or seek an in-person evaluation if the symptoms or condition worsens.  Duration of encounter: 30 minutes.  Total time on encounter, as per AMA guidelines included both the face-to-face and non-face-to-face time personally spent by the physician and/or other qualified health care professional(s) on the day of the encounter (includes time in activities that require the physician or other qualified health care professional and does not include time in activities normally performed by clinical staff). Physician's time may include the following activities when performed: Preparing to see the patient (e.g., pre-charting review of records, searching for previously ordered imaging, lab work, and nerve conduction tests) Review of prior analgesic pharmacotherapies. Reviewing PMP Interpreting ordered tests (e.g., lab work, imaging, nerve conduction tests) Performing post-procedure evaluations, including interpretation of diagnostic procedures Obtaining and/or reviewing separately obtained history Performing a medically appropriate examination and/or evaluation Counseling and educating the patient/family/caregiver Ordering medications, tests, or procedures Referring and communicating with other health care professionals (when not separately reported) Documenting clinical information in the electronic or other health record Independently interpreting results (not separately reported) and communicating results to the patient/ family/caregiver Care coordination (not separately reported)  Note by: Tylesha Gibeault K  Jerric Oyen, NP (TTS and AI technology used. I apologize for any typographical errors that were not detected and corrected.) Date: 03/20/2024; Time: 9:22 AM

## 2024-03-20 ENCOUNTER — Ambulatory Visit: Attending: Student in an Organized Health Care Education/Training Program | Admitting: Nurse Practitioner

## 2024-03-20 ENCOUNTER — Encounter: Payer: Self-pay | Admitting: Nurse Practitioner

## 2024-03-20 DIAGNOSIS — G894 Chronic pain syndrome: Secondary | ICD-10-CM | POA: Diagnosis present

## 2024-03-20 DIAGNOSIS — Z96651 Presence of right artificial knee joint: Secondary | ICD-10-CM

## 2024-03-20 DIAGNOSIS — M25562 Pain in left knee: Secondary | ICD-10-CM | POA: Diagnosis present

## 2024-03-20 DIAGNOSIS — G8929 Other chronic pain: Secondary | ICD-10-CM

## 2024-03-20 DIAGNOSIS — S46012S Strain of muscle(s) and tendon(s) of the rotator cuff of left shoulder, sequela: Secondary | ICD-10-CM

## 2024-03-20 DIAGNOSIS — M797 Fibromyalgia: Secondary | ICD-10-CM

## 2024-03-20 DIAGNOSIS — M1712 Unilateral primary osteoarthritis, left knee: Secondary | ICD-10-CM | POA: Diagnosis present

## 2024-03-20 MED ORDER — GABAPENTIN 300 MG PO CAPS: ORAL_CAPSULE | ORAL | 5 refills | Status: DC

## 2024-03-20 MED ORDER — TRAMADOL HCL 50 MG PO TABS
50.0000 mg | ORAL_TABLET | Freq: Four times a day (QID) | ORAL | 3 refills | Status: DC | PRN
Start: 2024-03-20 — End: 2024-08-13

## 2024-03-20 NOTE — Progress Notes (Signed)
 Safety precautions to be maintained throughout the outpatient stay will include: orient to surroundings, keep bed in low position, maintain call bell within reach at all times, provide assistance with transfer out of bed and ambulation.   Nursing Pain Medication Assessment:  Safety precautions to be maintained throughout the outpatient stay will include: orient to surroundings, keep bed in low position, maintain call bell within reach at all times, provide assistance with transfer out of bed and ambulation.  Medication Inspection Compliance: Pill count conducted under aseptic conditions, in front of the patient. Neither the pills nor the bottle was removed from the patient's sight at any time. Once count was completed pills were immediately returned to the patient in their original bottle.  Medication: Tramadol  (Ultram ) Pill/Patch Count:  0 of 120 pills remain Pill/Patch Appearance: Markings consistent with prescribed medication Bottle Appearance: Standard pharmacy container. Clearly labeled. Filled Date: 2 / 23 / 2025 Last Medication intake:  Ran out of medicine more than 48 hours ago

## 2024-03-24 LAB — TOXASSURE SELECT 13 (MW), URINE

## 2024-06-25 ENCOUNTER — Encounter: Payer: Self-pay | Admitting: Emergency Medicine

## 2024-06-25 ENCOUNTER — Ambulatory Visit
Admission: EM | Admit: 2024-06-25 | Discharge: 2024-06-25 | Disposition: A | Discharge status: Home / Self Care | Attending: Family Medicine | Admitting: Family Medicine

## 2024-06-25 DIAGNOSIS — L4050 Arthropathic psoriasis, unspecified: Secondary | ICD-10-CM

## 2024-06-25 DIAGNOSIS — S51812A Laceration without foreign body of left forearm, initial encounter: Secondary | ICD-10-CM | POA: Diagnosis not present

## 2024-06-25 MED ORDER — FLUCONAZOLE 150 MG PO TABS: 150.0000 mg | ORAL_TABLET | Freq: Once | ORAL | 0 refills | Status: AC

## 2024-06-25 MED ORDER — PREDNISONE 10 MG (21) PO TBPK: ORAL_TABLET | Freq: Every day | ORAL | 0 refills | Status: DC

## 2024-06-25 MED ORDER — CEPHALEXIN 500 MG PO CAPS: 500.0000 mg | ORAL_CAPSULE | Freq: Three times a day (TID) | ORAL | 0 refills | Status: DC

## 2024-06-25 NOTE — ED Provider Notes (Signed)
 [facility]    CSN: [id number] Arrival date & time: 06/25/24  1535      History   Chief Complaint Chief Complaint  Patient presents with   skin tear     HPI Amy Logan is a 53 y.o. female.   HPI  Amy Logan presents for skin tear after a big box dropped on her arm while moving the past 9 days.  Has been performing saline flushes and applying bacitracin without improvement. The area continues to bleed.  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   Has not been able to take her Humira for psoriatic arthritis.  Has multiple joint pains and bilateral rash on elbows.   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   Fibromyalgia    History of hiatal hernia    SMALL   History of kidney stones    H/O   Hypertension    Neuropathy     Patient Active Problem List   Diagnosis Date Noted   Stiffness of shoulder joint, left 02/01/2022   Traumatic complete tear of left rotator cuff 01/19/2022   Chronic pain syndrome 01/19/2022   Tear of left rotator cuff 01/15/2022   Arthritis of left acromioclavicular joint 01/03/2022   Tendonitis of shoulder, left 01/03/2022   Muscle strain of left shoulder 01/03/2022   Shoulder pain, left 01/03/2022   Arthritis of left shoulder region 01/03/2022   Primary osteoarthritis of left knee 03/08/2021   Fibromyalgia 08/18/2020   Chronic pain of left knee 08/18/2020   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   Psoriatic arthritis (HCC) 08/18/2020   Psoriasis 04/15/2020   Encounter for long-term (current) use of high-risk medication 04/15/2020   Increased band cell count 03/20/2019   Chronic fatigue 03/20/2019   RLQ abdominal pain 12/30/2018   Leukocytosis 12/30/2018   Anxiety and depression 10/24/2018   Arthritis 10/24/2018   Chronic, continuous use of opioids 10/24/2018   Chronic insomnia 10/24/2018    Hyperlipidemia 10/24/2018   Hypertension 10/24/2018   Neuropathy 10/24/2018   Obesity (BMI 30-39.9) 10/24/2018   H/O excision of epidermal inclusion cyst 10/24/2018   SIRS (systemic inflammatory response syndrome) (HCC) 10/14/2018   Epidermal inclusion cyst    Mass of soft tissue of abdomen 09/30/2018   B12 deficiency 11/20/2016   Vitamin D  insufficiency 11/20/2016   Hx of normocytic normochromic anemia 12/20/2015   Incomplete tear of right rotator cuff 11/21/2015    Past Surgical History:  Procedure Laterality Date   ABDOMINAL HYSTERECTOMY     APPENDECTOMY     BREAST CYST ASPIRATION Left    neg   CHOLECYSTECTOMY     COLONOSCOPY  2018   cyst removed     thyroglossal duct cyst   JOINT REPLACEMENT     LIPOMA EXCISION Right 10/09/2018   NOT A LIPOMA.  EPIDERMAL INCLUSION CYST   REPLACEMENT TOTAL KNEE     Right knee    OB History   No obstetric history on file.      Home Medications    Prior to Admission medications   Medication Sig Start Date End Date Taking? Authorizing Provider  cephALEXin  (KEFLEX ) 500 MG capsule Take 1 capsule (500 mg total) by mouth 3 (three) times daily. 06/25/24  Yes Geramy Lamorte, DO  fluconazole  (DIFLUCAN ) 150 MG tablet Take 1 tablet (  150 mg total) by mouth once for 1 dose. 06/25/24 06/25/24 Yes Keigan Girten, DO  predniSONE  (STERAPRED UNI-PAK 21 TAB) 10 MG (21) TBPK tablet Take by mouth daily. Take 6 tabs by mouth daily for 1, then 5 tabs for 1 day, then 4 tabs for 1 day, then 3 tabs for 1 day, then 2 tabs for 1 day, then 1 tab for 1 day. 06/25/24  Yes Jayceon Troy, DO  albuterol  (VENTOLIN  HFA) 108 (90 Base) MCG/ACT inhaler Inhale into the lungs.    [provider]  budesonide -formoterol  (SYMBICORT ) 160-4.5 MCG/ACT inhaler Inhale 2 puffs into the lungs 2 (two) times daily. 10/26/22   Rodriguez-Southworth, Sylvia, PA-C  cetirizine (ZYRTEC) 10 MG tablet Take 10 mg by mouth daily as needed for allergies.    [provider]   cyanocobalamin (VITAMIN B12) 1000 MCG/ML injection Inject 1,000 mcg into the muscle every 30 (thirty) days.    [provider]  cyclobenzaprine  (FLEXERIL ) 10 MG tablet Take 1 tablet (10 mg total) by mouth 2 (two) times daily as needed for muscle spasms. 06/09/23   Benjamin, Lunise, NP  fluticasone  (FLONASE ) 50 MCG/ACT nasal spray Place 1 spray into both nostrils daily.    [provider]  furosemide (LASIX) 20 MG tablet Take 20 mg by mouth daily as needed. 10/28/20   [provider]  gabapentin  (NEURONTIN ) 300 MG capsule 300 mg q AM, 600 mg qHS 03/20/24   Patel, Seema K, NP  ibuprofen (ADVIL) 800 MG tablet Take 800 mg by mouth 3 (three) times daily.    [provider]  lisinopril -hydrochlorothiazide  (PRINZIDE ,ZESTORETIC ) 20-25 MG tablet Take 1 tablet by mouth every morning.    [provider]  montelukast (SINGULAIR) 10 MG tablet Take 10 mg by mouth daily as needed.    [provider]  omeprazole (PRILOSEC) 40 MG capsule Take 1 capsule by mouth daily. 11/22/21   [provider]  traMADol  (ULTRAM ) 50 MG tablet Take 1 tablet (50 mg total) by mouth every 6 (six) hours as needed. 03/20/24   Patel, Seema K, NP  triamcinolone (KENALOG) 0.025 % cream Apply 1 application topically 2 (two) times daily.    [provider]    Family History Family History  Problem Relation Age of Onset   Diabetes Mother    Heart failure Mother    Asthma Mother    Cancer Father    Breast cancer Cousin 43       mat cousin    Social History Social History   Tobacco Use   Smoking status: Every Day    Current packs/day: 0.50    Average packs/day: 0.5 packs/day for 25.0 years (12.5 ttl pk-yrs)    Types: Cigarettes   Smokeless tobacco: Never  Vaping Use   Vaping status: Never Used  Substance Use Topics   Alcohol use: Not Currently    Comment: social   Drug use: No     Allergies   Coconut (cocos nucifera), Morphine, Ondansetron , Onion,  Sumatriptan, Butorphanol, Promethazine, Coconut flavoring agent (non-screening), Demerol [meperidine], Phenergan [promethazine hcl], and Zofran  [ondansetron  hcl]   Review of Systems Review of Systems :negative unless otherwise stated in HPI.      Physical Exam Triage Vital Signs ED Triage Vitals  Encounter Vitals Group     BP      Girls Systolic BP Percentile      Girls Diastolic BP Percentile      Boys Systolic BP Percentile      Boys Diastolic BP Percentile  Pulse      Resp      Temp      Temp src      SpO2      Weight      Height      Head Circumference      Peak Flow      Pain Score      Pain Loc      Pain Education      Exclude from Growth Chart    No data found.  Updated Vital Signs BP (!) 149/94 (BP Location: Right Arm)   Pulse 70   Temp 98.1 F (36.7 C)   Resp 16   SpO2 96%   Visual Acuity Right Eye Distance:   Left Eye Distance:   Bilateral Distance:    Right Eye Near:   Left Eye Near:    Bilateral Near:     Physical Exam  GEN: alert, well appearing female, in no acute distress  CV: regular rate  RESP: no increased work of breathing MSK: no extremity edema, good ROM of BUE and BLE  NEURO: alert, moves all extremities appropriately SKIN: warm and dry; white plaques on posterior elbows extending to proximal forearm, anterior distal forearm with triangular skin tear with medial abrasion        UC Treatments / Results  Labs (all labs ordered are listed, but only abnormal results are displayed) Labs Reviewed - No data to display  EKG   Radiology No results found.  Procedures Procedures (including critical care time)  Medications Ordered in UC Medications - No data to display  Initial Impression / Assessment and Plan / UC Course  I have reviewed the triage vital signs and the nursing notes.  Pertinent labs & imaging results that were available during my care of the patient were reviewed by me and considered in my medical  decision making (see chart for details).      Pt is a 53 y.o. female who presents after injuring left forearm 9 days ago after a box fell onto her forearm. Triangular skin avulsion healing by secondary intention. No bleeding visible. Has serous drainage at the edge. Advised to cover at work but other let wound be open to air. Keflex  prescribed to prevent infection. Diflucan  for antibiotic associated yeast infection prevention. Tetanus is UTD.  OTC analgesics as needed for pain.    Pt with psoriasis and psoriatic arthritis who is not on a DMARD. She has white plaques on her posterior elbows / distal forearms and multiple arthralgias. Treat with prednisone  for acute flare up.    Reviewed expectations regarding course of current medical issues.  All questions asked were answered.  Outlined signs and symptoms indicating need for more acute intervention. Patient verbalized understanding. After Visit Summary given.   Final Clinical Impressions(s) / UC Diagnoses   Final diagnoses:  Skin tear of left forearm without complication, initial encounter  Psoriatic arthritis of multiple joints [facility])     Discharge Instructions      Stop by the pharmacy to pick up your prescriptions.  Follow up with your primary care provider or return to the urgent care, if not improving.   Monitor your blood sugar and blood pressure while taking prednisone .      ED Prescriptions     Medication Sig Dispense Auth. Provider   predniSONE  (STERAPRED UNI-PAK 21 TAB) 10 MG (21) TBPK tablet Take by mouth daily. Take 6 tabs by mouth daily for 1, then 5 tabs for 1 day,  then 4 tabs for 1 day, then 3 tabs for 1 day, then 2 tabs for 1 day, then 1 tab for 1 day. 21 tablet Zalen Sequeira, DO   cephALEXin  (KEFLEX ) 500 MG capsule Take 1 capsule (500 mg total) by mouth 3 (three) times daily. 21 capsule Sharline Lehane, DO   fluconazole  (DIFLUCAN ) 150 MG tablet Take 1 tablet (150 mg total) by mouth once for 1 dose. 1 tablet  Kriste Berth, DO      PDMP not reviewed this encounter.              Malyna Budney, DO 06/25/24 1651

## 2024-06-25 NOTE — Discharge Instructions (Addendum)
 Stop by the pharmacy to pick up your prescriptions.  Follow up with your primary care provider or return to the urgent care, if not improving.   Monitor your blood sugar and blood pressure while taking prednisone .

## 2024-06-25 NOTE — ED Triage Notes (Signed)
 Pt presents with a skin tear to her left wrist 9 days ago after dropping a box on it. She went to change the bandage yesterday and it started to bleed. She is concerned because it is not forming a scab.

## 2024-07-01 ENCOUNTER — Other Ambulatory Visit: Payer: Self-pay | Admitting: Medical Genetics

## 2024-07-02 ENCOUNTER — Other Ambulatory Visit
Admission: RE | Admit: 2024-07-02 | Discharge: 2024-07-02 | Disposition: A | Discharge status: Home / Self Care | Payer: Self-pay | Source: Ambulatory Visit | Attending: Medical Genetics | Admitting: Medical Genetics

## 2024-07-12 LAB — GENECONNECT MOLECULAR SCREEN: Genetic Analysis Overall Interpretation: NEGATIVE

## 2024-07-15 ENCOUNTER — Ambulatory Visit
Admission: EM | Admit: 2024-07-15 | Discharge: 2024-07-15 | Disposition: A | Discharge status: Home / Self Care | Attending: Physician Assistant | Admitting: Physician Assistant

## 2024-07-15 DIAGNOSIS — M545 Low back pain, unspecified: Secondary | ICD-10-CM

## 2024-07-15 MED ORDER — CYCLOBENZAPRINE HCL 10 MG PO TABS: 10.0000 mg | ORAL_TABLET | Freq: Three times a day (TID) | ORAL | 0 refills | Status: AC | PRN

## 2024-07-15 NOTE — Discharge Instructions (Signed)
 BACK PAIN: Stressed avoiding painful activities . RICE (REST, ICE, COMPRESSION, ELEVATION) guidelines reviewed. May alternate ice and heat. Consider use of muscle rubs, Salonpas patches, etc. Use medications as directed including muscle relaxers if prescribed. Take anti-inflammatory medications as prescribed or OTC NSAIDs/Tylenol.  F/u with PCP in 7-10 days for reexamination, and please feel free to call or return to the urgent care at any time for any questions or concerns you may have and we will be happy to help you!   BACK PAIN RED FLAGS: If the back pain acutely worsens or there are any red flag symptoms such as numbness/tingling, leg weakness, saddle anesthesia, or loss of bowel/bladder control, go immediately to the ER. Follow up with [place] as scheduled or sooner if the pain does not begin to resolve or if it worsens before the follow up
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
## 2024-08-13 ENCOUNTER — Encounter: Payer: Self-pay | Admitting: Nurse Practitioner

## 2024-08-13 ENCOUNTER — Ambulatory Visit: Attending: Nurse Practitioner | Admitting: Nurse Practitioner

## 2024-08-13 DIAGNOSIS — Z96651 Presence of right artificial knee joint: Secondary | ICD-10-CM | POA: Insufficient documentation

## 2024-08-13 DIAGNOSIS — G8929 Other chronic pain: Secondary | ICD-10-CM | POA: Insufficient documentation

## 2024-08-13 DIAGNOSIS — M797 Fibromyalgia: Secondary | ICD-10-CM | POA: Insufficient documentation

## 2024-08-13 DIAGNOSIS — M25562 Pain in left knee: Secondary | ICD-10-CM | POA: Diagnosis present

## 2024-08-13 DIAGNOSIS — S46012S Strain of muscle(s) and tendon(s) of the rotator cuff of left shoulder, sequela: Secondary | ICD-10-CM | POA: Insufficient documentation

## 2024-08-13 DIAGNOSIS — G894 Chronic pain syndrome: Secondary | ICD-10-CM | POA: Insufficient documentation

## 2024-08-13 DIAGNOSIS — M1712 Unilateral primary osteoarthritis, left knee: Secondary | ICD-10-CM | POA: Insufficient documentation

## 2024-08-13 MED ORDER — KETOROLAC TROMETHAMINE 60 MG/2ML IM SOLN
60.0000 mg | Freq: Once | INTRAMUSCULAR | Status: AC
  Administered 2024-08-13: 60 mg via INTRAMUSCULAR
  Filled 2024-08-13: qty 2

## 2024-08-13 MED ORDER — METHOCARBAMOL 1000 MG/10ML IJ SOLN
200.0000 mg | Freq: Once | INTRAMUSCULAR | Status: AC
  Administered 2024-08-13: 200 mg via INTRAMUSCULAR
  Filled 2024-08-13: qty 10

## 2024-08-13 MED ORDER — TRAMADOL HCL 50 MG PO TABS: 50.0000 mg | ORAL_TABLET | Freq: Four times a day (QID) | ORAL | 3 refills | Status: DC | PRN

## 2024-08-13 MED ORDER — GABAPENTIN 300 MG PO CAPS: ORAL_CAPSULE | ORAL | 5 refills | Status: DC

## 2024-08-13 NOTE — Progress Notes (Signed)
 Nursing Pain Medication Assessment:  Safety precautions to be maintained throughout the outpatient stay will include: orient to surroundings, keep bed in low position, maintain call bell within reach at all times, provide assistance with transfer out of bed and ambulation.  Medication Inspection Compliance: Pill count conducted under aseptic conditions, in front of the patient. Neither the pills nor the bottle was removed from the patient's sight at any time. Once count was completed pills were immediately returned to the patient in their original bottle.  Medication: Tramadol  (Ultram ) Pill/Patch Count: 0 of 120 pills/patches remain Pill/Patch Appearance: Markings consistent with prescribed medication Bottle Appearance: Standard pharmacy container. Clearly labeled. Filled Date: 08 / 15 / 2025 Last Medication intake:  Ran out of medicine more than 48 hours ago
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
## 2024-10-09 ENCOUNTER — Telehealth: Payer: Self-pay | Admitting: Student in an Organized Health Care Education/Training Program
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
## 2024-10-21 ENCOUNTER — Telehealth: Payer: Self-pay | Admitting: Student in an Organized Health Care Education/Training Program

## 2024-10-21 ENCOUNTER — Other Ambulatory Visit: Payer: Self-pay | Admitting: *Deleted

## 2024-10-21 DIAGNOSIS — Z96651 Presence of right artificial knee joint: Secondary | ICD-10-CM

## 2024-10-21 DIAGNOSIS — S46012S Strain of muscle(s) and tendon(s) of the rotator cuff of left shoulder, sequela: Secondary | ICD-10-CM

## 2024-10-21 DIAGNOSIS — G8929 Other chronic pain: Secondary | ICD-10-CM

## 2024-10-21 DIAGNOSIS — G894 Chronic pain syndrome: Secondary | ICD-10-CM

## 2024-10-21 DIAGNOSIS — M1712 Unilateral primary osteoarthritis, left knee: Secondary | ICD-10-CM

## 2024-10-21 DIAGNOSIS — M797 Fibromyalgia: Secondary | ICD-10-CM

## 2024-10-21 MED ORDER — GABAPENTIN 300 MG PO CAPS: ORAL_CAPSULE | ORAL | 5 refills | Status: AC

## 2024-10-21 MED ORDER — TRAMADOL HCL 50 MG PO TABS: 50.0000 mg | ORAL_TABLET | Freq: Four times a day (QID) | ORAL | 3 refills | Status: AC | PRN

## 2024-10-21 NOTE — Telephone Encounter (Signed)
 Rx request sent to [facility].  Cancelled prescriptions for Tramadol  and Gabapentin  at [facility].

## 2024-10-21 NOTE — Telephone Encounter (Signed)
 Pt stated that [facility] was out of network and she needs to change her pharmacy

## 2024-12-01 ENCOUNTER — Encounter: Payer: Self-pay | Admitting: Emergency Medicine

## 2024-12-01 ENCOUNTER — Emergency Department

## 2024-12-01 DIAGNOSIS — Z79899 Other long term (current) drug therapy: Secondary | ICD-10-CM | POA: Diagnosis not present

## 2024-12-01 DIAGNOSIS — M436 Torticollis: Secondary | ICD-10-CM | POA: Insufficient documentation

## 2024-12-01 DIAGNOSIS — I10 Essential (primary) hypertension: Secondary | ICD-10-CM | POA: Insufficient documentation

## 2024-12-01 DIAGNOSIS — R509 Fever, unspecified: Secondary | ICD-10-CM | POA: Insufficient documentation

## 2024-12-01 DIAGNOSIS — D72829 Elevated white blood cell count, unspecified: Secondary | ICD-10-CM | POA: Insufficient documentation

## 2024-12-01 DIAGNOSIS — Z8541 Personal history of malignant neoplasm of cervix uteri: Secondary | ICD-10-CM | POA: Insufficient documentation

## 2024-12-01 DIAGNOSIS — M542 Cervicalgia: Secondary | ICD-10-CM | POA: Diagnosis present

## 2024-12-01 LAB — CBC WITH DIFFERENTIAL/PLATELET
Abs Immature Granulocytes: 0.07 K/uL (ref 0.00–0.07)
Basophils Absolute: 0.1 K/uL (ref 0.0–0.1)
Basophils Relative: 0 %
Eosinophils Absolute: 0.5 K/uL (ref 0.0–0.5)
Eosinophils Relative: 3 %
HCT: 38.2 % (ref 36.0–46.0)
Hemoglobin: 12.5 g/dL (ref 12.0–15.0)
Immature Granulocytes: 0 %
Lymphocytes Relative: 20 %
Lymphs Abs: 3.1 K/uL (ref 0.7–4.0)
MCH: 31.1 pg (ref 26.0–34.0)
MCHC: 32.7 g/dL (ref 30.0–36.0)
MCV: 95 fL (ref 80.0–100.0)
Monocytes Absolute: 0.7 K/uL (ref 0.1–1.0)
Monocytes Relative: 5 %
Neutro Abs: 11.2 K/uL — ABNORMAL HIGH (ref 1.7–7.7)
Neutrophils Relative %: 72 %
Platelets: 391 K/uL (ref 150–400)
RBC: 4.02 MIL/uL (ref 3.87–5.11)
RDW: 15.2 % (ref 11.5–15.5)
WBC: 15.6 K/uL — ABNORMAL HIGH (ref 4.0–10.5)
nRBC: 0 % (ref 0.0–0.2)

## 2024-12-01 LAB — COMPREHENSIVE METABOLIC PANEL WITH GFR
ALT: 11 U/L (ref 0–44)
AST: 13 U/L — ABNORMAL LOW (ref 15–41)
Albumin: 4.1 g/dL (ref 3.5–5.0)
Alkaline Phosphatase: 104 U/L (ref 38–126)
Anion gap: 12 (ref 5–15)
BUN: 8 mg/dL (ref 6–20)
CO2: 25 mmol/L (ref 22–32)
Calcium: 9.3 mg/dL (ref 8.9–10.3)
Chloride: 103 mmol/L (ref 98–111)
Creatinine, Ser: 0.66 mg/dL (ref 0.44–1.00)
GFR, Estimated: >60 mL/min
Glucose, Bld: 92 mg/dL (ref 70–99)
Potassium: 3.9 mmol/L (ref 3.5–5.1)
Sodium: 140 mmol/L (ref 135–145)
Total Bilirubin: 0.2 mg/dL (ref 0.0–1.2)
Total Protein: 7.4 g/dL (ref 6.5–8.1)

## 2024-12-01 NOTE — ED Triage Notes (Signed)
 Pt presents to the ED via POV with complaints of neck stiffness x 3 weeks - 6 weeks post-op from spinal surgery ([facility]). Rates the pain 7/10. No pain meds taken PTA outside of her prescribed tramadol  and gabapentin . Pt requesting basic labs to evaluate for possible post-op infection. A&Ox4 at this time. Denies CP or SOB.

## 2024-12-02 ENCOUNTER — Emergency Department
Admission: EM | Admit: 2024-12-02 | Discharge: 2024-12-02 | Disposition: A | Attending: Emergency Medicine | Admitting: Emergency Medicine

## 2024-12-02 ENCOUNTER — Emergency Department

## 2024-12-02 DIAGNOSIS — M436 Torticollis: Secondary | ICD-10-CM

## 2024-12-02 LAB — RESP PANEL BY RT-PCR (RSV, FLU A&B, COVID)  RVPGX2
Influenza A by PCR: NEGATIVE
Influenza B by PCR: NEGATIVE
Resp Syncytial Virus by PCR: NEGATIVE
SARS Coronavirus 2 by RT PCR: NEGATIVE

## 2024-12-02 LAB — URINALYSIS, W/ REFLEX TO CULTURE (INFECTION SUSPECTED)
Bilirubin Urine: NEGATIVE
Glucose, UA: NEGATIVE mg/dL
Hgb urine dipstick: NEGATIVE
Ketones, ur: NEGATIVE mg/dL
Leukocytes,Ua: NEGATIVE
Nitrite: NEGATIVE
Protein, ur: NEGATIVE mg/dL
Specific Gravity, Urine: 1.029 (ref 1.005–1.030)
pH: 5 (ref 5.0–8.0)

## 2024-12-02 LAB — LACTIC ACID, PLASMA: Lactic Acid, Venous: 1.2 mmol/L (ref 0.5–1.9)

## 2024-12-02 LAB — PROCALCITONIN: Procalcitonin: <0.1 ng/mL

## 2024-12-02 MED ORDER — KETOROLAC TROMETHAMINE 30 MG/ML IJ SOLN
30.0000 mg | Freq: Once | INTRAMUSCULAR | Status: AC
  Administered 2024-12-02: 30 mg via INTRAVENOUS
  Filled 2024-12-02: qty 1

## 2024-12-02 MED ORDER — LIDOCAINE 5 % EX PTCH: 1.0000 | MEDICATED_PATCH | CUTANEOUS | 0 refills | Status: AC

## 2024-12-02 MED ORDER — HYDROMORPHONE HCL 1 MG/ML IJ SOLN
1.0000 mg | Freq: Once | INTRAMUSCULAR | Status: AC
  Administered 2024-12-02: 1 mg via INTRAVENOUS
  Filled 2024-12-02: qty 1

## 2024-12-02 MED ORDER — SODIUM CHLORIDE 0.9 % IV BOLUS (SEPSIS)
1000.0000 mL | Freq: Once | INTRAVENOUS | Status: AC
  Administered 2024-12-02: 1000 mL via INTRAVENOUS

## 2024-12-02 MED ORDER — METHOCARBAMOL 500 MG PO TABS: 500.0000 mg | ORAL_TABLET | Freq: Three times a day (TID) | ORAL | 0 refills | Status: AC | PRN

## 2024-12-02 NOTE — ED Notes (Signed)
 Lt green recollected and sent to the lab

## 2024-12-02 NOTE — ED Notes (Signed)
 AVS provided by edp was reviewed with pt. Pt verbalized understanding with no additional questions at this time. Pt wheelchair to car. Going home with daughter at bedside

## 2024-12-02 NOTE — ED Provider Notes (Signed)
 "  [facility] Provider Note    Event Date/Time   First MD Initiated Contact with Patient 12/02/24 0153     (approximate)   History   Torticollis   HPI  Amy Logan is a 54 y.o. female with history of psoriatic arthritis, fibromyalgia, hypertension, neuropathy who presents to the emergency department with complaints of bilateral neck pain worse with turning her head both to the right and left ongoing for about a month.  Denies any injury.  No numbness, tingling, weakness, bowel or bladder incontinence.  Patient did have recent L4-L5 fusion with Dr. Dow approximately 6 weeks ago.  Denies any increased low back pain.  Daughter also reports that she has had fevers at home measured of 101.  Last fever was 2 days ago.  No cough, congestion, vomiting, diarrhea, urinary symptoms.   History provided by patient, daughter.    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  EPIDERMAL INCLUSION CYST   REPLACEMENT TOTAL KNEE     Right knee    MEDICATIONS:  Prior to Admission medications  Medication Sig Start Date End Date Taking? Authorizing Provider  albuterol  (VENTOLIN  HFA) 108 (90 Base) MCG/ACT inhaler Inhale into the lungs.    [provider]  budesonide -formoterol  (SYMBICORT ) 160-4.5 MCG/ACT inhaler Inhale 2 puffs into the lungs 2 (two) times daily. 10/26/22   Rodriguez-Southworth, Sylvia, PA-C  cetirizine (ZYRTEC) 10 MG tablet Take 10 mg by  mouth daily as needed for allergies.    [provider]  cyanocobalamin (VITAMIN B12) 1000 MCG/ML injection Inject 1,000 mcg into the muscle every 30 (thirty) days.    [provider]  cyclobenzaprine  (FLEXERIL ) 10 MG tablet Take 1 tablet (10 mg total) by mouth 3 (three) times daily as needed for muscle spasms. 07/15/24   Arvis Jolan NOVAK, PA-C  fluticasone  (FLONASE ) 50 MCG/ACT nasal spray Place 1 spray into both nostrils daily.    [provider]  furosemide (LASIX) 20 MG tablet Take 20 mg by mouth daily as needed. 10/28/20   [provider]  gabapentin  (NEURONTIN ) 300 MG capsule 300 mg q AM, 600 mg qHS 10/21/24   Patel, Seema K, NP  ibuprofen (ADVIL) 800 MG tablet Take 800 mg by mouth 3 (three) times daily.    [provider]  lisinopril -hydrochlorothiazide  (PRINZIDE ,ZESTORETIC ) 20-25 MG tablet Take 1 tablet by mouth every morning.    [provider]  montelukast (SINGULAIR) 10 MG tablet Take 10 mg by mouth daily as needed.    [provider]  omeprazole (PRILOSEC) 40 MG capsule Take 1 capsule by mouth daily. 11/22/21   [provider]  traMADol  (ULTRAM ) 50 MG tablet Take 1 tablet (50 mg total) by mouth every 6 (six) hours as needed. 10/21/24  Patel, Seema K, NP  triamcinolone (KENALOG) 0.025 % cream Apply 1 application topically 2 (two) times daily.    [provider]    Physical Exam   Triage Vital Signs: ED Triage Vitals  Encounter Vitals Group     BP 12/01/24 2231 (!) 152/100     Girls Systolic BP Percentile --      Girls Diastolic BP Percentile --      Boys Systolic BP Percentile --      Boys Diastolic BP Percentile --      Pulse Rate 12/01/24 2231 86     Resp 12/01/24 2231 18     Temp 12/01/24 2231 98.4 F (36.9 C)     Temp Source 12/01/24 2231 Oral     SpO2 12/01/24 2231 97 %     Weight 12/01/24 2229 178 lb 12.7 oz (81.1 kg)     Height 12/01/24 2229 5' 6 (1.676 m)     Head Circumference --      Peak  Flow --      Pain Score 12/01/24 2229 7     Pain Loc --      Pain Education --      Exclude from Growth Chart --     Most recent vital signs: Vitals:   12/02/24 0158 12/02/24 0313  BP:  (!) 154/106  Pulse:  74  Resp:  15  Temp: 98.2 F (36.8 C) 98 F (36.7 C)  SpO2:  99%    CONSTITUTIONAL: Alert, responds appropriately to questions. Well-appearing; well-nourished HEAD: Normocephalic, atraumatic EYES: Conjunctivae clear, pupils appear equal, sclera nonicteric ENT: normal nose; moist mucous membranes NECK: Supple, no midline spinal tenderness or step-off or deformity, patient has pain with moving her neck to the right or left but is able to flex with almost normal range of motion CARD: RRR; S1 and S2 appreciated RESP: Normal chest excursion without splinting or tachypnea; breath sounds clear and equal bilaterally; no wheezes, no rhonchi, no rales, no hypoxia or respiratory distress, speaking full sentences ABD/GI: Non-distended; soft, non-tender, no rebound, no guarding, no peritoneal signs BACK: The back appears normal, incision site to the low back is completely healed without overlying skin changes and there is no lower spinal tenderness, step-off or deformity EXT: Normal ROM in all joints; no deformity noted, no edema SKIN: Normal color for age and race; warm; no rash on exposed skin NEURO: Moves all extremities equally, normal speech, normal sensation, no facial asymmetry, no saddle anesthesia, no hyperreflexia PSYCH: The patient's mood and manner are appropriate.   ED Results / Procedures / Treatments   LABS: (all labs ordered are listed, but only abnormal results are displayed) Labs Reviewed  CBC WITH DIFFERENTIAL/PLATELET - Abnormal; Notable for the following components:      Result Value   WBC 15.6 (*)    Neutro Abs 11.2 (*)    All other components within normal limits  COMPREHENSIVE METABOLIC PANEL WITH GFR - Abnormal; Notable for the following components:   AST  13 (*)    All other components within normal limits  URINALYSIS, W/ REFLEX TO CULTURE (INFECTION SUSPECTED) - Abnormal; Notable for the following components:   Color, Urine YELLOW (*)    APPearance CLOUDY (*)    Bacteria, UA RARE (*)    All other components within normal limits  RESP PANEL BY RT-PCR (RSV, FLU A&B, COVID)  RVPGX2  CULTURE, BLOOD (SINGLE)  LACTIC ACID, PLASMA  PROCALCITONIN     EKG:  EKG Interpretation Date/Time:  Ventricular Rate:    PR Interval:    QRS Duration:    QT Interval:    QTC Calculation:   R Axis:      Text Interpretation:           RADIOLOGY: My personal review and interpretation of imaging: CT cervical spine shows no acute abnormality.  Chest x-ray clear.  I have personally reviewed all radiology reports.   DG Chest Portable 1 View Result Date: 12/02/2024 EXAM: 1 VIEW(S) XRAY OF THE CHEST 12/02/2024 02:37:43 AM COMPARISON: None available. CLINICAL HISTORY: fever 101 FINDINGS: LUNGS AND PLEURA: No focal pulmonary opacity. No pleural effusion. No pneumothorax. HEART AND MEDIASTINUM: Small hiatal hernia. No acute abnormality of the cardiac silhouette. BONES AND SOFT TISSUES: No acute osseous abnormality. IMPRESSION: 1. No acute findings. Electronically signed by: Dorethia Molt MD MD 12/02/2024 02:50 AM EST RP Workstation: [id number]   CT Cervical Spine Wo Contrast Result Date: 12/01/2024 EXAM: CT CERVICAL SPINE WITHOUT CONTRAST 12/01/2024 11:51:28 PM TECHNIQUE: CT of the cervical spine was performed without the administration of intravenous contrast. Multiplanar reformatted images are provided for review. Automated exposure control, iterative reconstruction, and/or weight based adjustment of the mA/kV was utilized to reduce the radiation dose to as low as reasonably achievable. COMPARISON: None available. CLINICAL HISTORY: post op pain FINDINGS: BONES AND ALIGNMENT: An osseous fissure is noted. Grade 1 anterolisthesis of C3 on C4 and C4 on C5. No  acute fracture or traumatic malalignment. DEGENERATIVE CHANGES: Multilevel mild degenerative changes of the spine. No associated severe osseous, neural foraminal, or central canal stenosis. SOFT TISSUES: No prevertebral soft tissue swelling. IMPRESSION: 1. No acute findings. Electronically signed by: Morgane Naveau MD MD 12/01/2024 11:55 PM EST RP Workstation: [id number]     PROCEDURES:  Critical Care performed: No    Procedures    IMPRESSION / MDM / ASSESSMENT AND PLAN / ED COURSE  I reviewed the triage vital signs and the nursing notes.    Patient here with complaints of neck pain for a month worse with turning her head to the right and left.  Daughter also reports 2 days of fever of 101, last was 2 days ago.     DIFFERENTIAL DIAGNOSIS (includes but not limited to):   Torticollis, cervical strain, doubt fracture, cervical myelopathy, critical spinal stenosis, discitis or osteomyelitis, transverse myelitis, epidural abscess or hematoma  Low suspicion clinically for meningitis.  Doubt postoperative infection to the low back.   Patient's presentation is most consistent with acute presentation with potential threat to life or bodily function.   PLAN: Workup initiated from triage.  Patient has a leukocytosis of 15,000 but on review of her records it looks like she chronically has an elevated white blood cell count.  She is afebrile here but reports fevers of 101 at home 2 days ago.  Normal electrolytes, LFTs.  CT of the cervical spine without contrast reviewed and interpreted by myself and radiologist and is unremarkable.  No increased pain over the lumbar spine and the incision is healed without overlying skin changes.  I did discuss with her that my suspicion for meningitis given a month of symptoms is very unlikely and that our only way to rule this out completely would be a lumbar puncture.  She agrees and does not want to proceed with lumbar puncture today.   I did recommend  obtaining additional labs, chest x-ray, urine given reports of fever.  She is well-appearing, nontoxic here.  I suspect that patient has torticollis as she has  very tight trapezius muscles on exam and has pain with rotating her head to the right and left.  Will give pain medication and reassess.   MEDICATIONS GIVEN IN ED: Medications  sodium chloride  0.9 % bolus 1,000 mL (1,000 mLs Intravenous New Bag/Given 12/02/24 0308)  HYDROmorphone  (DILAUDID ) injection 1 mg (1 mg Intravenous Given 12/02/24 0308)  ketorolac  (TORADOL ) 30 MG/ML injection 30 mg (30 mg Intravenous Given 12/02/24 9692)     ED COURSE: Chest x-ray reviewed and interpreted by myself and radiologist clear.  Urine shows no sign of infection.  COVID, flu and RSV negative.  Lactic and procalcitonin normal.  Patient continues to be afebrile here.  Again discussed the possibility of meningitis being low and patient agrees and does not want to proceed any further with LP.  I think this is very reasonable especially given length of time of symptoms does not seem to correlate with typical course of meningitis.  Recommended muscle relaxers, Lidoderm  patches, anti-inflammatories, stretching, alternate heat and ice and follow-up with her outpatient doctors.  It appears she was prescribed 30 oxycodone  tablets 8 days ago by Dr. Dow.  At this time, I do not feel there is any life-threatening condition present. I reviewed all nursing notes, vitals, pertinent previous records.  All lab and urine results, EKGs, imaging ordered have been independently reviewed and interpreted by myself.  I reviewed all available radiology reports from any imaging ordered this visit.  Based on my assessment, I feel the patient is safe to be discharged home without further emergent workup and can continue workup as an outpatient as needed. Discussed all findings, treatment plan as well as usual and customary return precautions.  They verbalize understanding and  are comfortable with this plan.  Outpatient follow-up has been provided as needed.  All questions have been answered.    CONSULTS:  none   OUTSIDE RECORDS REVIEWED: Reviewed recent orthopedic notes.       FINAL CLINICAL IMPRESSION(S) / ED DIAGNOSES   Final diagnoses:  Torticollis     Rx / DC Orders   ED Discharge Orders          Ordered    methocarbamol  (ROBAXIN ) 500 MG tablet  Every 8 hours PRN        12/02/24 0447    lidocaine  (LIDODERM ) 5 %  Every 24 hours        12/02/24 0447             Note:  This document was prepared using Dragon voice recognition software and may include unintentional dictation errors.   Phyliss Hulick, Josette SAILOR, DO 12/02/24 [phone]  "

## 2024-12-02 NOTE — Discharge Instructions (Signed)
 You may alternate over the counter Tylenol 1000 mg every 6 hours as needed for pain, fever and Ibuprofen 800 mg every 6-8 hours as needed for pain, fever.  Please take Ibuprofen with food.  Do not take more than 4000 mg of Tylenol (acetaminophen) in a 24 hour period.

## 2024-12-07 LAB — CULTURE, BLOOD (SINGLE)
Culture: NO GROWTH
Special Requests: ADEQUATE

## 2025-02-04 ENCOUNTER — Encounter: Admitting: Nurse Practitioner
